# Patient Record
Sex: Female | Born: 1975
Health system: Southern US, Community
[De-identification: ages and names within clinical notes are randomized; demographics above are authoritative.]

## PROBLEM LIST (undated history)

## (undated) DIAGNOSIS — Z789 Other specified health status: Secondary | ICD-10-CM

## (undated) DIAGNOSIS — G479 Sleep disorder, unspecified: Secondary | ICD-10-CM

## (undated) DIAGNOSIS — F419 Anxiety disorder, unspecified: Secondary | ICD-10-CM

## (undated) DIAGNOSIS — N189 Chronic kidney disease, unspecified: Secondary | ICD-10-CM

## (undated) DIAGNOSIS — M1990 Unspecified osteoarthritis, unspecified site: Secondary | ICD-10-CM

## (undated) DIAGNOSIS — R0600 Dyspnea, unspecified: Secondary | ICD-10-CM

## (undated) DIAGNOSIS — G43709 Chronic migraine without aura, not intractable, without status migrainosus: Secondary | ICD-10-CM

## (undated) DIAGNOSIS — F429 Obsessive-compulsive disorder, unspecified: Secondary | ICD-10-CM

## (undated) DIAGNOSIS — R0602 Shortness of breath: Secondary | ICD-10-CM

## (undated) DIAGNOSIS — B009 Herpesviral infection, unspecified: Secondary | ICD-10-CM

## (undated) DIAGNOSIS — G4730 Sleep apnea, unspecified: Secondary | ICD-10-CM

## (undated) DIAGNOSIS — N289 Disorder of kidney and ureter, unspecified: Secondary | ICD-10-CM

## (undated) DIAGNOSIS — F988 Other specified behavioral and emotional disorders with onset usually occurring in childhood and adolescence: Secondary | ICD-10-CM

## (undated) DIAGNOSIS — F32A Depression, unspecified: Secondary | ICD-10-CM

## (undated) DIAGNOSIS — G4761 Periodic limb movement disorder: Secondary | ICD-10-CM

## (undated) DIAGNOSIS — E739 Lactose intolerance, unspecified: Secondary | ICD-10-CM

## (undated) DIAGNOSIS — N3010 Interstitial cystitis (chronic) without hematuria: Secondary | ICD-10-CM

## (undated) DIAGNOSIS — T7840XA Allergy, unspecified, initial encounter: Secondary | ICD-10-CM

## (undated) DIAGNOSIS — G5600 Carpal tunnel syndrome, unspecified upper limb: Secondary | ICD-10-CM

## (undated) DIAGNOSIS — F458 Other somatoform disorders: Secondary | ICD-10-CM

## (undated) DIAGNOSIS — K9041 Non-celiac gluten sensitivity: Secondary | ICD-10-CM

## (undated) DIAGNOSIS — Z87442 Personal history of urinary calculi: Secondary | ICD-10-CM

## (undated) DIAGNOSIS — R51 Headache: Secondary | ICD-10-CM

## (undated) DIAGNOSIS — K219 Gastro-esophageal reflux disease without esophagitis: Secondary | ICD-10-CM

## (undated) DIAGNOSIS — R197 Diarrhea, unspecified: Secondary | ICD-10-CM

## (undated) DIAGNOSIS — F329 Major depressive disorder, single episode, unspecified: Secondary | ICD-10-CM

## (undated) DIAGNOSIS — R609 Edema, unspecified: Secondary | ICD-10-CM

## (undated) DIAGNOSIS — IMO0002 Reserved for concepts with insufficient information to code with codable children: Secondary | ICD-10-CM

## (undated) HISTORY — DX: Diarrhea, unspecified: R19.7

## (undated) HISTORY — DX: Shortness of breath: R06.02

## (undated) HISTORY — DX: Disorder of kidney and ureter, unspecified: N28.9

## (undated) HISTORY — DX: Lactose intolerance, unspecified: E73.9

## (undated) HISTORY — DX: Carpal tunnel syndrome, unspecified upper limb: G56.00

## (undated) HISTORY — PX: OTHER SURGICAL HISTORY: SHX169

## (undated) HISTORY — DX: Reserved for concepts with insufficient information to code with codable children: IMO0002

## (undated) HISTORY — PX: WISDOM TOOTH EXTRACTION: SHX21

## (undated) HISTORY — DX: Periodic limb movement disorder: G47.61

## (undated) HISTORY — DX: Chronic migraine without aura, not intractable, without status migrainosus: G43.709

## (undated) HISTORY — DX: Other specified behavioral and emotional disorders with onset usually occurring in childhood and adolescence: F98.8

## (undated) HISTORY — DX: Allergy, unspecified, initial encounter: T78.40XA

## (undated) HISTORY — PX: COLONOSCOPY: SHX174

## (undated) HISTORY — DX: Interstitial cystitis (chronic) without hematuria: N30.10

## (undated) HISTORY — DX: Non-celiac gluten sensitivity: K90.41

## (undated) HISTORY — DX: Sleep disorder, unspecified: G47.9

## (undated) HISTORY — DX: Unspecified osteoarthritis, unspecified site: M19.90

## (undated) HISTORY — DX: Other somatoform disorders: F45.8

## (undated) HISTORY — DX: Edema, unspecified: R60.9

## (undated) HISTORY — DX: Obsessive-compulsive disorder, unspecified: F42.9

## (undated) HISTORY — PX: LASIK: SHX215

---

## 1999-03-22 ENCOUNTER — Emergency Department (HOSPITAL_COMMUNITY): Admission: EM | Admit: 1999-03-22 | Discharge: 1999-03-22 | Payer: Self-pay | Admitting: Emergency Medicine

## 1999-03-22 ENCOUNTER — Encounter: Payer: Self-pay | Admitting: Emergency Medicine

## 2001-01-09 ENCOUNTER — Other Ambulatory Visit: Admission: RE | Admit: 2001-01-09 | Discharge: 2001-01-09 | Payer: Self-pay | Admitting: Obstetrics and Gynecology

## 2001-05-24 ENCOUNTER — Inpatient Hospital Stay (HOSPITAL_COMMUNITY): Admission: AD | Admit: 2001-05-24 | Discharge: 2001-05-24 | Payer: Self-pay | Admitting: Obstetrics and Gynecology

## 2001-08-05 ENCOUNTER — Inpatient Hospital Stay (HOSPITAL_COMMUNITY): Admission: AD | Admit: 2001-08-05 | Discharge: 2001-08-08 | Payer: Self-pay | Admitting: Obstetrics and Gynecology

## 2001-08-09 ENCOUNTER — Inpatient Hospital Stay (HOSPITAL_COMMUNITY): Admission: AD | Admit: 2001-08-09 | Discharge: 2001-08-09 | Payer: Self-pay | Admitting: Obstetrics and Gynecology

## 2002-03-18 ENCOUNTER — Encounter: Payer: Self-pay | Admitting: Emergency Medicine

## 2002-03-18 ENCOUNTER — Emergency Department (HOSPITAL_COMMUNITY): Admission: EM | Admit: 2002-03-18 | Discharge: 2002-03-18 | Payer: Self-pay | Admitting: Emergency Medicine

## 2003-03-12 ENCOUNTER — Ambulatory Visit (HOSPITAL_COMMUNITY): Admission: RE | Admit: 2003-03-12 | Discharge: 2003-03-12 | Payer: Self-pay | Admitting: Internal Medicine

## 2003-03-12 ENCOUNTER — Encounter: Payer: Self-pay | Admitting: Internal Medicine

## 2003-07-10 ENCOUNTER — Emergency Department (HOSPITAL_COMMUNITY): Admission: EM | Admit: 2003-07-10 | Discharge: 2003-07-10 | Payer: Self-pay | Admitting: Emergency Medicine

## 2009-02-05 ENCOUNTER — Inpatient Hospital Stay (HOSPITAL_COMMUNITY): Admission: AD | Admit: 2009-02-05 | Discharge: 2009-02-05 | Payer: Self-pay | Admitting: Obstetrics & Gynecology

## 2009-05-05 ENCOUNTER — Encounter: Admission: RE | Admit: 2009-05-05 | Discharge: 2009-05-05 | Payer: Self-pay | Admitting: Family Medicine

## 2009-05-11 ENCOUNTER — Other Ambulatory Visit: Admission: RE | Admit: 2009-05-11 | Discharge: 2009-05-11 | Payer: Self-pay | Admitting: Family Medicine

## 2010-03-18 ENCOUNTER — Ambulatory Visit (HOSPITAL_COMMUNITY): Admission: RE | Admit: 2010-03-18 | Discharge: 2010-03-18 | Payer: Self-pay | Admitting: Psychiatry

## 2011-03-28 LAB — CBC
HCT: 38.1 % (ref 36.0–46.0)
Hemoglobin: 13 g/dL (ref 12.0–15.0)
MCHC: 34.2 g/dL (ref 30.0–36.0)
MCV: 89.2 fL (ref 78.0–100.0)
RBC: 4.27 MIL/uL (ref 3.87–5.11)
WBC: 8.1 10*3/uL (ref 4.0–10.5)

## 2011-03-28 LAB — URINALYSIS, ROUTINE W REFLEX MICROSCOPIC
Bilirubin Urine: NEGATIVE
Glucose, UA: NEGATIVE mg/dL
Ketones, ur: NEGATIVE mg/dL
Leukocytes, UA: NEGATIVE
Nitrite: NEGATIVE
Protein, ur: NEGATIVE mg/dL
Specific Gravity, Urine: 1.01 (ref 1.005–1.030)
Urobilinogen, UA: 0.2 mg/dL (ref 0.0–1.0)
pH: 7.5 (ref 5.0–8.0)

## 2011-03-28 LAB — WET PREP, GENITAL
Clue Cells Wet Prep HPF POC: NONE SEEN
Trich, Wet Prep: NONE SEEN
Yeast Wet Prep HPF POC: NONE SEEN

## 2011-03-28 LAB — URINE MICROSCOPIC-ADD ON

## 2011-03-28 LAB — POCT PREGNANCY, URINE: Preg Test, Ur: NEGATIVE

## 2011-04-28 NOTE — Op Note (Signed)
[name]  Patient:    Gloria Rodriguez, Gloria Rodriguez Visit Number: [id number] MRN: [id number]          Service Type: OBS Location: 910A 9101 01 Attending Physician:  Leonard Schwartz Dictated by:   Janine Limbo, M.D. Proc. Date: 08/05/01 Adm. Date:  08/05/2001                             Operative Report  PREOPERATIVE DIAGNOSES:       1. Term intrauterine pregnancy.                               2. Active labor.                               3. Questionable herpes simplex virus outbreak.  POSTOPERATIVE DIAGNOSES:      1. Term intrauterine pregnancy.                               2. Active labor.                               3. Questionable herpes simplex virus outbreak.                               4. Meconium-stained amniotic fluid.                               5. Fibroid uterus.  PROCEDURE:                    Primary low transverse cesarean section.  SURGEON:                      Janine Limbo, M.D.  ASSISTANT:                    Wynelle Bourgeois, CNM  ANESTHESIA:                   Spinal.  DISPOSITION:                  Ms. Army Melia is a 35 year old female, gravida 1, para 0, who presents at term. The patient has a history of herpes simplex virus and she reports having symptoms suggestive of a herpes simplex virus outbreak. The patient understands the indications for her procedure and she accepts the risk of, but not limited to, anesthetic complications, bleeding, infections, and possible damage to the surrounding organs.  FINDINGS:                     A 7-pound 13-ounce female infant Kara Mead) was delivered. The Apgars were 8 at one minute and 9 at five minutes. There was meconium-stained amniotic fluid but no meconium was noted beneath the vocal cords. The placenta appeared normal. There was a 1 cm fibroid on the fundus of the uterus. The uterus was otherwise normal. The fallopian tubes and the ovaries were normal.  ESTIMATED  BLOOD LOSS:         700 cc.  DESCRIPTION OF PROCEDURE:  The patient was taken to the operating room where a spinal anesthetic was given. The patients abdomen, perineum, and vagina were prepped with multiple layers of Betadine. A Foley catheter was placed in the bladder. The patient was sterilely draped. A low transverse incision was made in the abdomen and carried sharply through the subcutaneous tissue, the fascia, and the anterior peritoneum. An incision was made in the lower uterine segment and extended transversely. Meconium-stained amniotic fluid was noted. The fetal head was delivered. The mouth and nose were suctioned using the DeLee trap. The remainder of the infant was delivered. The cord was clamped and cut and the infant was handed to the awaiting pediatric team. Routine cord blood studies were obtained. The placenta was manually removed. The uterine cavity was cleaned of amniotic fluid, clotted blood, and membranes. The uterine incision was closed using a running suture of 2-0 Vicryl followed by figure-of-eight sutures of 2-0 Vicryl and 0 Vicryl for hemostasis. Hemostasis was noted to be adequate. The pericolonic gutters were cleaned of amniotic fluid and clotted blood. The anterior peritoneum and the abdominal musculature were reapproximated in the midline using 2-0 Vicryl. The fascia was irrigated as was the subcutaneous tissue. Hemostasis was adequate. The fascia was closed using a running suture of 0 Vicryl followed by three interrupted sutures of 0 Vicryl. The subcutaneous layer was closed using a running suture of 3-0 Vicryl. The skin was reapproximated using skin staples. Sponge, needle, and instrument counts were correct on two occasions. The estimated blood loss was 700 cc. The patient tolerated her procedure well. The patient was taken to the recovery room in stable condition. The infant was taken to the full-term nursery in stable condition. Dictated by:   Janine Limbo, M.D. Attending Physician:  Leonard Schwartz DD:  08/05/01 TD:  08/06/01 Job: [phone] [id number]
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
## 2011-04-28 NOTE — Discharge Summary (Signed)
[facility]  Patient:    Gloria Rodriguez, Gloria Rodriguez Visit Number: [id number] MRN: [id number]          Service Type: OBS Location: 910A 9101 01 Attending Physician:  Leonard Schwartz Dictated by:   Nigel Bridgeman, C.N.M. Adm. Date:  08/05/2001 Disc. Date: 08/08/01                             Discharge Summary  DATE OF BIRTH:                04/27/1976.  ADMITTING DIAGNOSES:          1. Term pregnancy.                               2. Active labor.                               3. Herpes simplex virus outbreak.  DISCHARGE DIAGNOSES:          1. Term pregnancy.                               2. Active labor.                               3. Herpes simplex virus outbreak.                               4. A 1-cm fibroid.                               5. Meconium fluid.  PROCEDURES:                   1. Primary low transverse cesarean section.                               2. Spinal anesthesia.  HOSPITAL COURSE:              Ms. Gloria Rodriguez is a 35 year old, gravida 1, para 0, at 39-4/7 weeks who presented in active labor on August 05, 2001. Her cervix was 5 cm and 80%. There was an area of friability of tissue in the left labia consistent with a HSV lesion. Therefore, the decision was made to proceed with primary elective low transverse cesarean section. The patient was taken to the operating room where this procedure was performed by Dr. Kirkland Hun. Findings were a viable female by the name of Kara Mead who weighed 7 pounds 13 ounces with Apgars of 8 and 9. Normal placenta was noted. There was a 1-cm fibroid. There was meconium fluid noted. Normal tubes and ovaries were identified. Estimated blood loss was 700 cc. The patient tolerated the procedure well and was taken to the recovery room in good condition. The infant was taken to the full-term nursery in good condition.  On postoperative day #1 the patient was doing well. She was up ad lib. She  was breast-feeding. She had not decided her method of contraception. Her hemoglobin was 11.9. Her physical exam was within normal limits.  Her incision was clean, dry, and intact.  The rest of her hospital course was unremarkable. By postoperative day #3 she was doing well. Her incision was clean, dry, and intact. Breast-feeding was going well and the patient was still undecided regarding contraception. She was deemed to have received the full benefit of her hospital stay and was discharged home.  DISCHARGE INSTRUCTIONS:       Instructions are [employer].  DISCHARGE MEDICATIONS:        1. Motrin 600 mg p.o. q.6h. p.r.n. pain.                               2. Tylox one to two p.o. q.3-4h. p.r.n. pain.  DISCHARGE FOLLOWUP:           Followup will occur in six weeks at [facility]. Dictated by:   Nigel Bridgeman, C.N.M. Attending Physician:  Leonard Schwartz DD:  08/08/01 TD:  08/08/01 Job: [phone] [id number]

## 2011-07-11 ENCOUNTER — Encounter (HOSPITAL_COMMUNITY): Payer: Self-pay | Admitting: Anesthesiology

## 2011-07-11 ENCOUNTER — Inpatient Hospital Stay (HOSPITAL_COMMUNITY): Payer: Managed Care, Other (non HMO) | Admitting: Anesthesiology

## 2011-07-11 ENCOUNTER — Other Ambulatory Visit: Payer: Self-pay | Admitting: Obstetrics & Gynecology

## 2011-07-11 ENCOUNTER — Encounter (HOSPITAL_COMMUNITY)
Admission: AD | Disposition: A | Discharge status: Home / Self Care | Payer: Self-pay | Source: Ambulatory Visit | Attending: Obstetrics & Gynecology

## 2011-07-11 ENCOUNTER — Encounter (HOSPITAL_COMMUNITY): Payer: Self-pay | Admitting: *Deleted

## 2011-07-11 ENCOUNTER — Inpatient Hospital Stay (HOSPITAL_COMMUNITY)
Admission: AD | Admit: 2011-07-11 | Discharge: 2011-07-14 | DRG: 766 | Disposition: A | Discharge status: Home / Self Care | Payer: Managed Care, Other (non HMO) | Source: Ambulatory Visit | Attending: Obstetrics & Gynecology | Admitting: Obstetrics & Gynecology

## 2011-07-11 DIAGNOSIS — O34219 Maternal care for unspecified type scar from previous cesarean delivery: Principal | ICD-10-CM | POA: Diagnosis present

## 2011-07-11 DIAGNOSIS — O09529 Supervision of elderly multigravida, unspecified trimester: Secondary | ICD-10-CM | POA: Diagnosis present

## 2011-07-11 HISTORY — DX: Headache: R51

## 2011-07-11 HISTORY — DX: Depression, unspecified: F32.A

## 2011-07-11 HISTORY — DX: Herpesviral infection, unspecified: B00.9

## 2011-07-11 HISTORY — DX: Gastro-esophageal reflux disease without esophagitis: K21.9

## 2011-07-11 HISTORY — DX: Anxiety disorder, unspecified: F41.9

## 2011-07-11 HISTORY — DX: Major depressive disorder, single episode, unspecified: F32.9

## 2011-07-11 LAB — CBC
MCH: 29.6 pg (ref 26.0–34.0)
MCHC: 34 g/dL (ref 30.0–36.0)
MCV: 87 fL (ref 78.0–100.0)
Platelets: 193 10*3/uL (ref 150–400)
RDW: 16.4 % — ABNORMAL HIGH (ref 11.5–15.5)

## 2011-07-11 LAB — RPR: RPR: NONREACTIVE

## 2011-07-11 LAB — TYPE AND SCREEN: Antibody Screen: NEGATIVE

## 2011-07-11 LAB — STREP B DNA PROBE: GBS: NEGATIVE

## 2011-07-11 LAB — HIV ANTIBODY (ROUTINE TESTING W REFLEX): HIV: NONREACTIVE

## 2011-07-11 SURGERY — Surgical Case
Anesthesia: Epidural | Site: Abdomen | Wound class: Clean Contaminated

## 2011-07-11 MED ORDER — LACTATED RINGERS IV SOLN
INTRAVENOUS | Status: DC
  Administered 2011-07-11: 07:00:00 via INTRAVENOUS

## 2011-07-11 MED ORDER — MAGNESIUM HYDROXIDE 400 MG/5ML PO SUSP: 30.0000 mL | ORAL | Status: DC | PRN

## 2011-07-11 MED ORDER — OXYTOCIN 20 UNITS IN LACTATED RINGERS INFUSION - SIMPLE: 1.0000 m[IU]/min | INTRAVENOUS | Status: DC

## 2011-07-11 MED ORDER — OXYTOCIN 10 UNIT/ML IJ SOLN
INTRAMUSCULAR | Status: AC
  Filled 2011-07-11: qty 2

## 2011-07-11 MED ORDER — DIPHENHYDRAMINE HCL 25 MG PO CAPS
25.0000 mg | ORAL_CAPSULE | Freq: Four times a day (QID) | ORAL | Status: DC | PRN
  Administered 2011-07-11 – 2011-07-12 (×2): 25 mg via ORAL
  Filled 2011-07-11 (×2): qty 1

## 2011-07-11 MED ORDER — ACETAMINOPHEN 10 MG/ML IV SOLN: 1000.0000 mg | Freq: Four times a day (QID) | INTRAVENOUS | Status: AC | PRN

## 2011-07-11 MED ORDER — SERTRALINE HCL 100 MG PO TABS
100.0000 mg | ORAL_TABLET | Freq: Every day | ORAL | Status: DC
  Administered 2011-07-11 – 2011-07-14 (×4): 100 mg via ORAL
  Filled 2011-07-11 (×4): qty 1

## 2011-07-11 MED ORDER — LACTATED RINGERS IV SOLN
500.0000 mL | Freq: Once | INTRAVENOUS | Status: AC
  Administered 2011-07-11: 300 mL via INTRAVENOUS

## 2011-07-11 MED ORDER — HYDROMORPHONE HCL 1 MG/ML IJ SOLN: 0.2500 mg | INTRAMUSCULAR | Status: DC | PRN

## 2011-07-11 MED ORDER — LACTATED RINGERS IV SOLN
INTRAVENOUS | Status: DC | PRN
  Administered 2011-07-11 (×4): via INTRAVENOUS

## 2011-07-11 MED ORDER — BUPIVACAINE HCL (PF) 0.25 % IJ SOLN
INTRAMUSCULAR | Status: DC | PRN
  Administered 2011-07-11: 10 mL
  Administered 2011-07-11: 20 mL

## 2011-07-11 MED ORDER — ZOLPIDEM TARTRATE 5 MG PO TABS: 5.0000 mg | ORAL_TABLET | Freq: Every evening | ORAL | Status: DC | PRN

## 2011-07-11 MED ORDER — EPHEDRINE 5 MG/ML INJ
INTRAVENOUS | Status: AC
  Filled 2011-07-11: qty 10

## 2011-07-11 MED ORDER — MEPERIDINE HCL 25 MG/ML IJ SOLN
INTRAMUSCULAR | Status: DC | PRN
  Administered 2011-07-11: 25 mg via INTRAVENOUS

## 2011-07-11 MED ORDER — IBUPROFEN 600 MG PO TABS
600.0000 mg | ORAL_TABLET | Freq: Four times a day (QID) | ORAL | Status: DC
  Administered 2011-07-12 – 2011-07-14 (×11): 600 mg via ORAL
  Filled 2011-07-11 (×4): qty 1

## 2011-07-11 MED ORDER — ACETAMINOPHEN 10 MG/ML IV SOLN: 1000.0000 mg | Freq: Once | INTRAVENOUS | Status: DC | PRN

## 2011-07-11 MED ORDER — LACTATED RINGERS IV SOLN
500.0000 mL | INTRAVENOUS | Status: DC | PRN
  Administered 2011-07-11: 500 mL via INTRAVENOUS

## 2011-07-11 MED ORDER — LIDOCAINE-EPINEPHRINE (PF) 2 %-1:200000 IJ SOLN
INTRAMUSCULAR | Status: AC
  Filled 2011-07-11: qty 20

## 2011-07-11 MED ORDER — MEPERIDINE HCL 25 MG/ML IJ SOLN
INTRAMUSCULAR | Status: AC
  Filled 2011-07-11: qty 1

## 2011-07-11 MED ORDER — EPHEDRINE 5 MG/ML INJ
10.0000 mg | INTRAVENOUS | Status: DC | PRN
  Filled 2011-07-11: qty 4

## 2011-07-11 MED ORDER — DIPHENHYDRAMINE HCL 50 MG/ML IJ SOLN
12.5000 mg | INTRAMUSCULAR | Status: DC | PRN
  Filled 2011-07-11: qty 1

## 2011-07-11 MED ORDER — FENTANYL CITRATE 0.05 MG/ML IJ SOLN
INTRAMUSCULAR | Status: AC
  Filled 2011-07-11: qty 2

## 2011-07-11 MED ORDER — NALOXONE HCL 0.4 MG/ML IJ SOLN: 0.4000 mg | INTRAMUSCULAR | Status: DC | PRN

## 2011-07-11 MED ORDER — LIDOCAINE HCL (PF) 1 % IJ SOLN
30.0000 mL | INTRAMUSCULAR | Status: DC | PRN
  Filled 2011-07-11: qty 30

## 2011-07-11 MED ORDER — KETOROLAC TROMETHAMINE 30 MG/ML IJ SOLN
INTRAMUSCULAR | Status: AC
  Administered 2011-07-11: 30 mg via INTRAVENOUS
  Filled 2011-07-11: qty 1

## 2011-07-11 MED ORDER — PHENYLEPHRINE HCL 10 MG/ML IJ SOLN
INTRAMUSCULAR | Status: DC | PRN
  Administered 2011-07-11: 80 ug via INTRAVENOUS

## 2011-07-11 MED ORDER — ONDANSETRON HCL 4 MG/2ML IJ SOLN
4.0000 mg | Freq: Four times a day (QID) | INTRAMUSCULAR | Status: DC | PRN
  Administered 2011-07-11: 4 mg via INTRAVENOUS
  Filled 2011-07-11: qty 2

## 2011-07-11 MED ORDER — TERBUTALINE SULFATE 1 MG/ML IJ SOLN: 0.2500 mg | Freq: Once | INTRAMUSCULAR | Status: DC | PRN

## 2011-07-11 MED ORDER — EPHEDRINE 5 MG/ML INJ
10.0000 mg | INTRAVENOUS | Status: DC | PRN
  Filled 2011-07-11 (×2): qty 4

## 2011-07-11 MED ORDER — MENTHOL 3 MG MT LOZG: 1.0000 | LOZENGE | OROMUCOSAL | Status: DC | PRN

## 2011-07-11 MED ORDER — DIPHENHYDRAMINE HCL 25 MG PO CAPS: 25.0000 mg | ORAL_CAPSULE | ORAL | Status: DC | PRN

## 2011-07-11 MED ORDER — HYDROMORPHONE HCL 1 MG/ML IJ SOLN: 2.0000 mg | INTRAMUSCULAR | Status: DC | PRN

## 2011-07-11 MED ORDER — CEFAZOLIN SODIUM 1-5 GM-% IV SOLN
INTRAVENOUS | Status: AC
  Filled 2011-07-11: qty 50

## 2011-07-11 MED ORDER — ONDANSETRON HCL 4 MG/2ML IJ SOLN: 4.0000 mg | Freq: Three times a day (TID) | INTRAMUSCULAR | Status: DC | PRN

## 2011-07-11 MED ORDER — FLEET ENEMA 7-19 GM/118ML RE ENEM: 1.0000 | ENEMA | RECTAL | Status: DC | PRN

## 2011-07-11 MED ORDER — IBUPROFEN 600 MG PO TABS: 600.0000 mg | ORAL_TABLET | Freq: Four times a day (QID) | ORAL | Status: DC | PRN

## 2011-07-11 MED ORDER — SODIUM CHLORIDE 0.9 % IR SOLN
Status: DC | PRN
  Administered 2011-07-11: 1000 mL

## 2011-07-11 MED ORDER — IBUPROFEN 600 MG PO TABS
600.0000 mg | ORAL_TABLET | Freq: Four times a day (QID) | ORAL | Status: DC | PRN
  Filled 2011-07-11 (×7): qty 1

## 2011-07-11 MED ORDER — OXYTOCIN 20 UNITS IN LACTATED RINGERS INFUSION - SIMPLE
INTRAVENOUS | Status: DC | PRN
  Administered 2011-07-11 (×2): 20 [IU] via INTRAVENOUS

## 2011-07-11 MED ORDER — FENTANYL CITRATE 0.05 MG/ML IJ SOLN
INTRAMUSCULAR | Status: DC | PRN
  Administered 2011-07-11 (×4): 25 ug via INTRAVENOUS

## 2011-07-11 MED ORDER — MEPERIDINE HCL 25 MG/ML IJ SOLN: 6.2500 mg | INTRAMUSCULAR | Status: DC | PRN

## 2011-07-11 MED ORDER — CEFAZOLIN SODIUM 1-5 GM-% IV SOLN
INTRAVENOUS | Status: DC | PRN
  Administered 2011-07-11: 1 g via INTRAVENOUS

## 2011-07-11 MED ORDER — MORPHINE SULFATE (PF) 0.5 MG/ML IJ SOLN
INTRAMUSCULAR | Status: DC | PRN
  Administered 2011-07-11: 1 mg via INTRAVENOUS

## 2011-07-11 MED ORDER — FENTANYL 2.5 MCG/ML BUPIVACAINE 1/10 % EPIDURAL INFUSION (WH - ANES)
14.0000 mL/h | INTRAMUSCULAR | Status: DC
  Administered 2011-07-11: 14 mL/h via EPIDURAL
  Filled 2011-07-11: qty 60

## 2011-07-11 MED ORDER — PHENYLEPHRINE 40 MCG/ML (10ML) SYRINGE FOR IV PUSH (FOR BLOOD PRESSURE SUPPORT)
80.0000 ug | PREFILLED_SYRINGE | INTRAVENOUS | Status: DC | PRN
  Administered 2011-07-11 (×2): 80 ug via INTRAVENOUS
  Filled 2011-07-11 (×2): qty 5

## 2011-07-11 MED ORDER — SODIUM CHLORIDE 0.9 % IJ SOLN: 3.0000 mL | INTRAMUSCULAR | Status: DC | PRN

## 2011-07-11 MED ORDER — DIPHENHYDRAMINE HCL 50 MG/ML IJ SOLN
INTRAMUSCULAR | Status: AC
  Filled 2011-07-11: qty 1

## 2011-07-11 MED ORDER — ACETAMINOPHEN 325 MG PO TABS: 325.0000 mg | ORAL_TABLET | ORAL | Status: DC | PRN

## 2011-07-11 MED ORDER — SENNOSIDES-DOCUSATE SODIUM 8.6-50 MG PO TABS
1.0000 | ORAL_TABLET | Freq: Every day | ORAL | Status: DC
  Administered 2011-07-11 (×2): 1 via ORAL
  Administered 2011-07-12 – 2011-07-13 (×2): 2 via ORAL

## 2011-07-11 MED ORDER — SIMETHICONE 80 MG PO CHEW
80.0000 mg | CHEWABLE_TABLET | ORAL | Status: DC | PRN
  Administered 2011-07-11: 80 mg via ORAL

## 2011-07-11 MED ORDER — DIPHENHYDRAMINE HCL 50 MG/ML IJ SOLN
12.5000 mg | INTRAMUSCULAR | Status: DC | PRN
  Administered 2011-07-11: 50 mg via INTRAVENOUS

## 2011-07-11 MED ORDER — METOCLOPRAMIDE HCL 5 MG/ML IJ SOLN: 10.0000 mg | Freq: Three times a day (TID) | INTRAMUSCULAR | Status: DC | PRN

## 2011-07-11 MED ORDER — NALBUPHINE SYRINGE 5 MG/0.5 ML
5.0000 mg | INJECTION | INTRAMUSCULAR | Status: DC | PRN
  Filled 2011-07-11: qty 1

## 2011-07-11 MED ORDER — SODIUM CHLORIDE 0.9 % IV SOLN
1.0000 ug/kg/h | INTRAVENOUS | Status: DC | PRN
  Filled 2011-07-11: qty 2.5

## 2011-07-11 MED ORDER — PROMETHAZINE HCL 25 MG/ML IJ SOLN: 6.2500 mg | INTRAMUSCULAR | Status: DC | PRN

## 2011-07-11 MED ORDER — FERROUS SULFATE 325 (65 FE) MG PO TABS
325.0000 mg | ORAL_TABLET | Freq: Two times a day (BID) | ORAL | Status: DC
  Administered 2011-07-12 – 2011-07-14 (×4): 325 mg via ORAL
  Filled 2011-07-11 (×4): qty 1

## 2011-07-11 MED ORDER — TETANUS-DIPHTH-ACELL PERTUSSIS 5-2.5-18.5 LF-MCG/0.5 IM SUSP: 0.5000 mL | Freq: Once | INTRAMUSCULAR | Status: DC

## 2011-07-11 MED ORDER — SIMETHICONE 80 MG PO CHEW
80.0000 mg | CHEWABLE_TABLET | Freq: Three times a day (TID) | ORAL | Status: DC
  Administered 2011-07-12 – 2011-07-14 (×9): 80 mg via ORAL

## 2011-07-11 MED ORDER — PRENATAL PLUS 27-1 MG PO TABS
1.0000 | ORAL_TABLET | Freq: Every day | ORAL | Status: DC
  Administered 2011-07-12 – 2011-07-14 (×3): 1 via ORAL
  Filled 2011-07-11 (×3): qty 1

## 2011-07-11 MED ORDER — MORPHINE SULFATE (PF) 0.5 MG/ML IJ SOLN
INTRAMUSCULAR | Status: DC | PRN
  Administered 2011-07-11: 4 mg via EPIDURAL

## 2011-07-11 MED ORDER — ONDANSETRON HCL 4 MG PO TABS: 4.0000 mg | ORAL_TABLET | ORAL | Status: DC | PRN

## 2011-07-11 MED ORDER — WITCH HAZEL-GLYCERIN EX PADS: MEDICATED_PAD | CUTANEOUS | Status: DC | PRN

## 2011-07-11 MED ORDER — LANOLIN HYDROUS EX OINT: 1.0000 "application " | TOPICAL_OINTMENT | CUTANEOUS | Status: DC | PRN

## 2011-07-11 MED ORDER — PHENYLEPHRINE 40 MCG/ML (10ML) SYRINGE FOR IV PUSH (FOR BLOOD PRESSURE SUPPORT)
80.0000 ug | PREFILLED_SYRINGE | INTRAVENOUS | Status: DC | PRN
  Filled 2011-07-11: qty 5

## 2011-07-11 MED ORDER — DIPHENHYDRAMINE HCL 50 MG/ML IJ SOLN: 25.0000 mg | INTRAMUSCULAR | Status: DC | PRN

## 2011-07-11 MED ORDER — OXYTOCIN 20 UNITS IN LACTATED RINGERS INFUSION - SIMPLE: 125.0000 mL/h | INTRAVENOUS | Status: AC

## 2011-07-11 MED ORDER — ONDANSETRON HCL 4 MG/2ML IJ SOLN
INTRAMUSCULAR | Status: DC | PRN
  Administered 2011-07-11: 4 mg via INTRAVENOUS

## 2011-07-11 MED ORDER — SODIUM BICARBONATE 8.4 % IV SOLN
INTRAVENOUS | Status: AC
  Filled 2011-07-11: qty 50

## 2011-07-11 MED ORDER — SODIUM BICARBONATE 8.4 % IV SOLN
INTRAVENOUS | Status: DC | PRN
  Administered 2011-07-11: 5 mL via EPIDURAL

## 2011-07-11 MED ORDER — SCOPOLAMINE 1 MG/3DAYS TD PT72: 1.0000 | MEDICATED_PATCH | Freq: Once | TRANSDERMAL | Status: DC

## 2011-07-11 MED ORDER — LACTATED RINGERS IV SOLN: INTRAVENOUS | Status: DC

## 2011-07-11 MED ORDER — PHENYLEPHRINE 40 MCG/ML (10ML) SYRINGE FOR IV PUSH (FOR BLOOD PRESSURE SUPPORT)
PREFILLED_SYRINGE | INTRAVENOUS | Status: AC
  Filled 2011-07-11: qty 5

## 2011-07-11 MED ORDER — ONDANSETRON HCL 4 MG/2ML IJ SOLN
INTRAMUSCULAR | Status: AC
  Filled 2011-07-11: qty 2

## 2011-07-11 MED ORDER — EPHEDRINE SULFATE 50 MG/ML IJ SOLN
INTRAMUSCULAR | Status: DC | PRN
  Administered 2011-07-11: 15 mg via INTRAVENOUS

## 2011-07-11 MED ORDER — CITRIC ACID-SODIUM CITRATE 334-500 MG/5ML PO SOLN
30.0000 mL | ORAL | Status: DC | PRN
  Administered 2011-07-11 (×2): 30 mL via ORAL
  Filled 2011-07-11 (×2): qty 30

## 2011-07-11 MED ORDER — ONDANSETRON HCL 4 MG/2ML IJ SOLN: 4.0000 mg | INTRAMUSCULAR | Status: DC | PRN

## 2011-07-11 MED ORDER — OXYTOCIN 20 UNITS IN LACTATED RINGERS INFUSION - SIMPLE: 125.0000 mL/h | Freq: Once | INTRAVENOUS | Status: DC

## 2011-07-11 MED ORDER — KETOROLAC TROMETHAMINE 30 MG/ML IJ SOLN: 30.0000 mg | Freq: Four times a day (QID) | INTRAMUSCULAR | Status: AC | PRN

## 2011-07-11 MED ORDER — MORPHINE SULFATE 0.5 MG/ML IJ SOLN
INTRAMUSCULAR | Status: AC
  Filled 2011-07-11: qty 10

## 2011-07-11 MED ORDER — KETOROLAC TROMETHAMINE 30 MG/ML IJ SOLN
30.0000 mg | Freq: Four times a day (QID) | INTRAMUSCULAR | Status: AC | PRN
  Administered 2011-07-11 (×2): 30 mg via INTRAVENOUS
  Filled 2011-07-11: qty 1

## 2011-07-11 SURGICAL SUPPLY — 39 items
CLOTH BEACON ORANGE TIMEOUT ST (SAFETY) ×2 IMPLANT
CONTAINER PREFILL 10% NBF 15ML (MISCELLANEOUS) IMPLANT
DRAPE UTILITY XL STRL (DRAPES) ×2 IMPLANT
DRESSING TELFA 8X3 (GAUZE/BANDAGES/DRESSINGS) ×2 IMPLANT
ELECT REM PT RETURN 9FT ADLT (ELECTROSURGICAL) ×2
ELECTRODE REM PT RTRN 9FT ADLT (ELECTROSURGICAL) ×1 IMPLANT
EXTRACTOR VACUUM M CUP 4 TUBE (SUCTIONS) IMPLANT
GLOVE BIO SURGEON STRL SZ 6.5 (GLOVE) ×4 IMPLANT
GLOVE BIO SURGEON STRL SZ7 (GLOVE) ×2 IMPLANT
GLOVE ECLIPSE 6.0 STRL STRAW (GLOVE) ×2 IMPLANT
GLOVE ECLIPSE 8.0 STRL XLNG CF (GLOVE) ×2 IMPLANT
GLOVE INDICATOR 6.5 STRL GRN (GLOVE) ×2 IMPLANT
GLOVE INDICATOR 7.5 STRL GRN (GLOVE) ×2 IMPLANT
GOWN BRE IMP SLV AUR LG STRL (GOWN DISPOSABLE) ×4 IMPLANT
GOWN PREVENTION PLUS LG XLONG (DISPOSABLE) ×2 IMPLANT
KIT ABG SYR 3ML LUER SLIP (SYRINGE) IMPLANT
NEEDLE HYPO 22GX1.5 SAFETY (NEEDLE) ×2 IMPLANT
NEEDLE HYPO 25X5/8 SAFETYGLIDE (NEEDLE) ×2 IMPLANT
NS IRRIG 1000ML POUR BTL (IV SOLUTION) ×2 IMPLANT
PACK C SECTION WH (CUSTOM PROCEDURE TRAY) ×2 IMPLANT
PAD ABD 7.5X8 STRL (GAUZE/BANDAGES/DRESSINGS) ×2 IMPLANT
RTRCTR C-SECT PINK 25CM LRG (MISCELLANEOUS) IMPLANT
SLEEVE SCD COMPRESS KNEE MED (MISCELLANEOUS) IMPLANT
STAPLER VISISTAT 35W (STAPLE) ×2 IMPLANT
SUT PLAIN 0 NONE (SUTURE) IMPLANT
SUT PLAIN 2 0 (SUTURE) ×1
SUT PLAIN ABS 2-0 CT1 27XMFL (SUTURE) ×1 IMPLANT
SUT VIC AB 0 CT1 27 (SUTURE) ×2
SUT VIC AB 0 CT1 27XBRD ANBCTR (SUTURE) ×2 IMPLANT
SUT VIC AB 0 CTX 36 (SUTURE) ×2
SUT VIC AB 0 CTX36XBRD ANBCTRL (SUTURE) ×2 IMPLANT
SUT VIC AB 2-0 CT1 27 (SUTURE)
SUT VIC AB 2-0 CT1 TAPERPNT 27 (SUTURE) IMPLANT
SUT VIC AB 3-0 SH 27 (SUTURE)
SUT VIC AB 3-0 SH 27X BRD (SUTURE) IMPLANT
SYR CONTROL 10ML LL (SYRINGE) ×2 IMPLANT
TOWEL OR 17X24 6PK STRL BLUE (TOWEL DISPOSABLE) ×4 IMPLANT
TRAY FOLEY CATH 14FR (SET/KITS/TRAYS/PACK) IMPLANT
WATER STERILE IRR 1000ML POUR (IV SOLUTION) ×2 IMPLANT

## 2011-07-11 NOTE — Progress Notes (Signed)
[facility] INFORMATION GIVEN TO PATIENT.  PATIENT STATES BABY IS FEEDING WELL.  ENCOURAGED TO CALL WITH QUESTIONS/ASSIST.

## 2011-07-11 NOTE — Anesthesia Postprocedure Evaluation (Signed)
  Anesthesia Post-op Note  Patient: Gloria Rodriguez  Procedure(s) Performed:  CESAREAN SECTION - Repeat   Patient is awake, responsive, moving her legs, and has signs of resolution of her numbness. Pain and nausea are reasonably well controlled. Vital signs are stable and clinically acceptable. Oxygen saturation is clinically acceptable. There are no apparent anesthetic complications at this time. Patient is ready for discharge.

## 2011-07-11 NOTE — Op Note (Signed)
07/11/2011  10:04 AM  PATIENT:  Gloria Rodriguez  35 y.o. female  PRE-OPERATIVE DIAGNOSIS:  fetal intolerance of labor, previous cesarean section  POST-OPERATIVE DIAGNOSIS:  fetal intolerance of labor, previous cesarean section  PROCEDURE:  Procedure(s): Repeat urgent CESAREAN SECTION  SURGEON:  Surgeon(s): Marie-Lyne Nevaeh Casillas  ASSISTANTS:  Arlan Organ   ANESTHESIA:   epidural  Procedure: Under general anesthesia the patient is in 15 left decubitus position, she is prepped with Betadine on the abdominal and suprapubic areas. She is draped as usual. The subcutaneous tissue was infiltrated with Marcaine one quarter plain 10 cc. A Pfannenstiel incision is made with a scalpel at the site of the previous scar. The adipose tissue was opened with the scalpel. The aponeurosis is opened with a scalpel and then with Mayo scissors transversely. The recti muscles were separated from the aponeurosis of the midline superiorly and inferiorly. The parietal peritoneum is opened bluntly with the fingers. The visceral peritoneum is opened transversely with Metzenbaums scissors over the lower uterine segment. The bladder reclined downwards.  A transverse hysterotomy is done with the scalpel  Over the lower uterine segment and extended on each side with dressing scissors.  The fetus is in cephalic presentation. Birth of a baby girl at 9:37 am.  The cord was on clamped and cut. PH was done which came back at 7.29. The baby was suctioned and given to the neonatal team. Apgars were 8 and 9. The placenta was extracted manually.  Uterine revision done. Pitocin started in the IV fluid. The patient received a dose of Ancef 1 g IV before starting the surgery. The uterus was contracting well. Both ovaries and both tubes were normal to inspection. The hysterotomy was closed in a first full plane locked running suture of Vicryl 0. A mattress layer of Vicryl 0 was then done as the second layer. Hemostasis was completed on the lower  right aspect of the incision with 2 figure-of-eight with Vicryl 2-0. The abdomino-pelvic cavities were irrigated and suctioned.  The aponeurosis was closed into half running sutures of Vicryl 0. A running stitch of plain was done in the adipose tissue. And the skin was reapproximated with staples. A compressive dry dressing was applied. The patient was brought to recovery room in good and stable status.   ESTIMATED BLOOD LOSS: * No blood loss amount entered *   Intake/Output Summary (Last 24 hours) at 07/11/11 1004 Last data filed at 07/11/11 0945  Gross per 24 hour  Intake   2500 ml  Output    800 ml  Net   1700 ml     BLOOD ADMINISTERED:none   LOCAL MEDICATIONS USED:  MARCAINE 10 CC  SPECIMEN:  Source of Specimen:  Placenta  DISPOSITION OF SPECIMEN:  PATHOLOGY  COUNTS:  YES   PLAN OF CARE: Transfer to PACU    Genia Del MD

## 2011-07-11 NOTE — H&P (Signed)
Gloria Rodriguez is a 35 y.o. female G2P1001 [redacted]w[redacted]d presenting for SROM, clear fluid at 1 am.  HPI:  SROM clear fluid at 1 am.  Mild irregular UCs.  FMs pos.  No PIH Sx.  ROS neg.  OB History    Grav Para Term Preterm Abortions TAB SAB Ect Mult Living   2 1 1  0 0 0 0 0 0 1     Past Medical History  Diagnosis Date  . Anxiety   . Depression   . Headache   . GERD (gastroesophageal reflux disease)   . HSV infection     on Valtrex suppression   Past Surgical History  Procedure Date  . Cesarean section        For genital HSV  Family History: family history is not on file. Social History:  does not have a smoking history on file. She does not have any smokeless tobacco history on file. She reports that she does not drink alcohol or use illicit drugs. Current facility-administered medications:citric acid-sodium citrate (ORACIT) solution 30 mL, 30 mL, Oral, Q2H PRN, Marie-Lyne Khaila Velarde, 30 mL at 07/11/11 0401;  diphenhydrAMINE (BENADRYL) injection 12.5 mg, 12.5 mg, Intravenous, Q15 min PRN, Tyrone Apple. Foster;  ePHEDrine injection 10 mg, 10 mg, Intravenous, PRN, Tyrone Apple. Foster;  ePHEDrine injection 10 mg, 10 mg, Intravenous, PRN, Tyrone Apple. Foster fentaNYL 2.5 mcg/ml w/bupivacaine 0.1% epidural (60 ml syringe), 14 mL/hr, Epidural, Continuous, Michael A. Foster;  ibuprofen (ADVIL,MOTRIN) tablet 600 mg, 600 mg, Oral, Q6H PRN, Marie-Lyne Solangel Mcmanaway;  lactated ringers infusion 500 mL, 500 mL, Intravenous, Once, Michael A. Foster, Last Rate: 999 mL/hr at 07/11/11 0348, 300 mL at 07/11/11 0348;  lactated ringers infusion 500-1,000 mL, 500-1,000 mL, Intravenous, PRN, Marie-Lyne Andreu Drudge lactated ringers infusion, , Intravenous, Continuous, Marie-Lyne Tawnia Schirm;  lidocaine (XYLOCAINE) 1 % injection 30 mL, 30 mL, Subcutaneous, PRN, Marie-Lyne Parthenia Tellefsen;  ondansetron (ZOFRAN) injection 4 mg, 4 mg, Intravenous, Q6H PRN, Marie-Lyne Saabir Blyth;  oxytocin (PITOCIN) IV infusion 20 units in LR 1000 mL, 125 mL/hr, Intravenous, Once,  Marie-Lyne Meelah Tallo oxytocin (PITOCIN) IV infusion 20 units in LR 1000 mL, 1-40 milli-units/min, Intravenous, Continuous, Marie-Lyne Tammera Engert;  phenylephrine injection 80 mcg, 80 mcg, Intravenous, PRN, Tyrone Apple. Foster;  phenylephrine injection 80 mcg, 80 mcg, Intravenous, PRN, Tyrone Apple. Foster;  sodium phosphate (FLEET) 7-19 GM/118ML enema 1 enema, 1 enema, Rectal, PRN, Marie-Lyne Dayron Odland terbutaline (BRETHINE) injection 0.25 mg, 0.25 mg, Subcutaneous, Once PRN, Marie-Lyne Shakthi Scipio Allergies  Allergen Reactions  . Codeine Other (See Comments)    migraines  . Hydrocodone Other (See Comments)    migraines  . Oxycodone Other (See Comments)    migraines  . Ultram (Tramadol Hcl) Other (See Comments)    Migraines/nightmares  . Vicodin (Hydrocodone-Acetaminophen) Other (See Comments)    migraines    Dilation: 1 Effacement (%): 50 Station: -3 Exam by:: Morrie Sheldon, RN Blood pressure 116/62, pulse 70, temperature 98.6 F (37 C), temperature source Oral, resp. rate 20, height 5' 5.5" (1.664 m), weight 94.802 kg (209 lb).  VE:  Unchanged, except Vtx fixed at -2. Lower limbs wnl  HPP:   [place] review of anato wnl.  1 hr GTT wnl.  GBS neg.  [place] interval growth AGA, AFI 29 cm stable at 36 wks, cephalic.  Prenatal labs: ABO, Rh:  O pos Antibody: Negative (07/31 0223) Rubella:  Immune RPR: Nonreactive (07/31 0224)  HBsAg: Negative (07/31 0224)  HIV: Non-reactive (07/31 0224)  Genetic testing:  Ultrascreen neg, AFP neg [place] anato: wnl 1  hr GTT: wnl GBS: Negative (07/31 0224)   Assessment/Plan: G2P1 37+ wks with SROM not in active labor, but increasing UCs, cephalic.  H/O previous C/S for genital HSV now on Valtrex.  Desires TOLAC.  F-well-being reassuring, reactive with BLine in 140's, resolved variable decelerations in Rt decubitus with O2 mask. Pitocin if no active labor at 6 am.  Epidural PRN.   Daleon Willinger,MARIE-LYNE  MD 07/11/2011, 4:19 AM

## 2011-07-11 NOTE — Anesthesia Procedure Notes (Signed)
Epidural Patient location during procedure: OB Start time: 07/11/2011 7:55 AM  Staffing Anesthesiologist: Brayton Caves R Performed by: anesthesiologist   Preanesthetic Checklist Completed: patient identified, site marked, surgical consent, pre-op evaluation, timeout performed, IV checked, risks and benefits discussed and monitors and equipment checked  Epidural Patient position: sitting Prep: site prepped and draped and DuraPrep Patient monitoring: continuous pulse ox and blood pressure Approach: midline Injection technique: LOR saline  Needle:  Needle type: Tuohy  Needle gauge: 17 G Needle length: 9 cm Needle insertion depth: 7 cm Catheter type: closed end flexible Catheter size: 19 Gauge Catheter at skin depth: 12 cm Test dose: negative  Assessment Events: blood not aspirated, injection not painful, no injection resistance, negative IV test and no paresthesia  Additional Notes Patient identified.  Risk benefits discussed including failed block, incomplete pain control, headache, nerve damage, paralysis, blood pressure changes, nausea, vomiting, reactions to medication both toxic or allergic, and postpartum back pain.  Patient expressed understanding and wished to proceed.  All questions were answered.  Sterile technique used throughout procedure and epidural site dressed with sterile barrier dressing. No paresthesia or other complications noted. 5 minutes prior to starting epidural infusion of fentanyl and bupivicaine, the epidural was loaded with 9 ml of 1.5% lidocaine in three separate doses.  The patient did not experience any signs of intravascular injection such as tinnitus or metallic taste in mouth nor signs of intrathecal spread such as rapid motor block. Please see nursing notes for vital signs.

## 2011-07-11 NOTE — Anesthesia Preprocedure Evaluation (Signed)
Anesthesia Evaluation  Name, MR# and DOB Patient awake  General Assessment Comment  Reviewed: Allergy & Precautions, H&P  and Patient's Chart, lab work & pertinent test results  Airway Mallampati: II TM Distance: >3 FB Neck ROM: full    Dental No notable dental hx    Pulmonaryneg pulmonary ROS    clear to auscultation  pulmonary exam normal   Cardiovascular regular Normal   Neuro/Psych (+) {AN ROS/MED HX NEURO HEADACHES (+) PSYCHIATRIC DISORDERS, Negative Neurological ROS Negative Psych ROS  GI/Hepatic/Renal negative GI ROS, negative Liver ROS, and negative Renal ROS (+)  GERD      Endo/Other  Negative Endocrine ROS (+)   Abdominal   Musculoskeletal  Hematology negative hematology ROS (+)   Peds  Reproductive/Obstetrics (+) Pregnancy   Anesthesia Other Findings             Anesthesia Physical Anesthesia Plan  ASA: II  Anesthesia Plan: Epidural   Post-op Pain Management:    Induction:   Airway Management Planned:   Additional Equipment:   Intra-op Plan:   Post-operative Plan:   Informed Consent: I have reviewed the patients History and Physical, chart, labs and discussed the procedure including the risks, benefits and alternatives for the proposed anesthesia with the patient or authorized representative who has indicated his/her understanding and acceptance.     Plan Discussed with:   Anesthesia Plan Comments:         Anesthesia Quick Evaluation

## 2011-07-11 NOTE — Progress Notes (Signed)
ROM 0100, few contractions

## 2011-07-11 NOTE — Transfer of Care (Signed)
Immediate Anesthesia Transfer of Care Note  Patient: Gloria Rodriguez  Procedure(s) Performed:  CESAREAN SECTION - Repeat  Patient Location: PACU  Anesthesia Type: Epidural  Level of Consciousness: awake, alert  and oriented  Airway & Oxygen Therapy: Patient Spontanous Breathing  Post-op Assessment: Report given to PACU RN and Post -op Vital signs reviewed and stable  Post vital signs: Reviewed and stable  Complications: No apparent anesthesia complications

## 2011-07-12 LAB — CBC
MCH: 29.1 pg (ref 26.0–34.0)
MCV: 88.4 fL (ref 78.0–100.0)
Platelets: 161 10*3/uL (ref 150–400)
RDW: 16.8 % — ABNORMAL HIGH (ref 11.5–15.5)

## 2011-07-12 MED ORDER — VALACYCLOVIR HCL 500 MG PO TABS
500.0000 mg | ORAL_TABLET | Freq: Two times a day (BID) | ORAL | Status: DC
  Administered 2011-07-13 – 2011-07-14 (×4): 500 mg via ORAL
  Filled 2011-07-12 (×4): qty 1

## 2011-07-12 MED ORDER — HYDROMORPHONE HCL 2 MG PO TABS
2.0000 mg | ORAL_TABLET | Freq: Four times a day (QID) | ORAL | Status: DC | PRN
  Administered 2011-07-12 – 2011-07-14 (×9): 2 mg via ORAL
  Filled 2011-07-12 (×10): qty 1

## 2011-07-12 NOTE — Progress Notes (Signed)
  S:         Reports feeling ok - intense itching since surgery - benadryl just makes her sleepy             Tolerating po intake / no nausea / no vomiting / some flatus / no BM             Bleeding is light             Pain controlled withnarcotic analgesia             Up ad lib / ambulatory  Newborn breast feeding  / Circumcision not indicated - girl   O:  A & O x 3 NAD              VS: Blood pressure 104/66, pulse 80, temperature 98.4 F (36.9 C), temperature source Oral, resp. rate 17, height 5' 5.5" (1.664 m), weight 94.802 kg (209 lb), SpO2 96.00%, unknown if currently breastfeeding.  LABS:  Basename 07/12/11 0500 07/11/11 0317  HGB 10.8* 12.8  HCT 32.8* 37.6    I&O: I/O last 3 completed shifts: In: 5517.1 [P.O.:1300; I.V.:4217.1] Out: 2500 [Urine:1900; Blood:600]  NET : + 3000      Lungs: Clear and unlabored  Heart: regular rate and rhythm / no mumurs  Abdomen: soft, non-tender, non-distended active bowel sounds - hypoactive             Fundus: firm, non-tender, Ueven             Dressing intact without drainage              Lochia: light  Extremities: no edema, no calf pain or tenderness, negative Homans  A:        POD # 1 S/P cesarean section - girl breastfeeding              P:        Routine postoperative care              Advance activity today             Shower with dressing removal     BAILEY,TANYA 07/12/2011, 9:04 AM

## 2011-07-12 NOTE — Progress Notes (Signed)
Pt c/o increasing pain of 4 out of 10.  Pt states that ibuprofen is not completely helping her pain; however she has allergies to many pain medications in which she "has a problem with sodium binding in tablet form that causes severe migraines."  Pt states she can take naproxen.  Notified Dr. Juliene Pina to discuss pain control and meds.  Dr. Juliene Pina questioned if patient could take dilaudid since naproxen was not ideal while also taking ibuprofen.  Pt stated that she has taken dilaudid and does not remember having any problems with it.  Dr. Juliene Pina ordered dilaudid 2mg  po q6hours prn severe pain.  Will continue to monitor.  Earl Gala, Linda Hedges [place]

## 2011-07-12 NOTE — Progress Notes (Signed)
Mom requesting Comfort Gels.  Bruising noted on Mom's nipples bilaterally.  Mom reports that nipples are misshapened after latching.  Mom given tips on how to widen baby's gape. Lurline [place]

## 2011-07-12 NOTE — Anesthesia Postprocedure Evaluation (Signed)
  Anesthesia Post-op Note  Patient: Gloria Rodriguez  Procedure(s) Performed:  CESAREAN SECTION - Repeat  Patient Location: PACU and Mother/Baby  Anesthesia Type: Regional  Level of Consciousness: awake, alert  and oriented  Airway and Oxygen Therapy: Patient Spontanous Breathing  Post-op Pain: mild  Post-op Assessment: Post-op Vital signs reviewed  Post-op Vital Signs: Reviewed and stable  Complications: No apparent anesthesia complications

## 2011-07-12 NOTE — Progress Notes (Signed)
Encounter addended by: Isabella Bowens on: 07/12/2011  7:51 AM<BR>     Documentation filed: Notes Section

## 2011-07-12 NOTE — Progress Notes (Signed)
Encounter addended by: Isabella Bowens on: 07/12/2011 10:36 AM<BR>     Documentation filed: Charges VN

## 2011-07-13 ENCOUNTER — Encounter (HOSPITAL_COMMUNITY): Payer: Self-pay

## 2011-07-13 NOTE — Progress Notes (Signed)
  Subjective: POD# 2 s/p Cesarean Delivery.  Indications: fetal distress and previous cesarean section                Information for the patient's newborn:  Aymar, Whitfill Girl Keeghan [[id number]]  female    Feeding: breast Patient reports tolerating PO. Pain controlled with Dilaudid and Ibuprofen. Denies HA/SOB/C/P/N/V/dizziness.  Reports flatus. Breast symptoms:none  She reports vaginal bleeding as normal, without clots.  She is ambulating, urinating without difficult.     Objective:  VS: Blood pressure 104/68, pulse 82, temperature 97.5 F (36.4 C), temperature source Oral, resp. rate 16, height 5' 5.5" (1.664 m), weight 209 lb (94.802 kg), SpO2 96.00%  No intake or output data in the 24 hours ending 07/13/11 1107      Basename 07/12/11 0500 07/11/11 0317  WBC 17.8* 11.5*  HGB 10.8* 12.8  HCT 32.8* 37.6  PLT 161 193     Blood type: O POS (07/31 0316)  Rubella: Immune (07/31 0223)     Physical Exam:  General: alert, cooperative, no distress and mildly obese CV: Regular rate and rhythm Resp: clear Abdomen: soft, nontender, normal bowel sounds Incision: clean, dry and intact Uterine Fundus: firm, below umbilicus, nontender Lochia: minimal Ext: no edema, redness or tenderness in the calves or thighs      Assessment/Plan: 35 y.o.  status post Cesarean section. POD# 2.   Doing well, stable.              Pain well controlled with Dilaudid  Ambulate Routine post-op care Anticipate discharge in AM  [facility] 07/13/2011, 11:07 AM

## 2011-07-13 NOTE — Consult Note (Signed)
SN. Has comfort gels.  Assisted with latch.  Follow up tomorrow.

## 2011-07-14 MED ORDER — VALACYCLOVIR HCL 500 MG PO TABS: 500.0000 mg | ORAL_TABLET | Freq: Two times a day (BID) | ORAL | Status: DC

## 2011-07-14 MED ORDER — IBUPROFEN 600 MG PO TABS: 600.0000 mg | ORAL_TABLET | Freq: Four times a day (QID) | ORAL | Status: AC | PRN

## 2011-07-14 MED ORDER — HYDROMORPHONE HCL 2 MG PO TABS: 2.0000 mg | ORAL_TABLET | Freq: Four times a day (QID) | ORAL | Status: AC | PRN

## 2011-07-14 NOTE — Progress Notes (Signed)
Reviewed engorgement tx. If needed . Discussed small infants  And potentiallly inconsistent , reminded mom if infant unable to feed both breast don't allow 2nd breast to become firm ,release pressure by pumping .

## 2011-07-14 NOTE — Plan of Care (Signed)
Problem: Discharge Progression Outcomes Goal: Remove staples per MD order Outcome: Adequate for Discharge Staples to be removed in MD office

## 2011-07-14 NOTE — Discharge Summary (Signed)
Patient ID: Gloria Rodriguez MRN: [id number] DOB/AGE: 1976/04/07 35 y.o.  Admit date: 07/11/2011 Discharge date:  07/14/2011  Admission Diagnoses: 1. IUP at 37w 6d with SROM 2. TOLAC and augmentation of labor  Discharge Diagnoses:  Active Problems:  Postpartum care following cesarean delivery  Postpartum care and examination of lactating mother   Prenatal history:  Gravida 2 para1 at 37w 6d gestation with [facility] of 07/26/11 Prenatal care at [facility] since [redacted] weeks gestation with Dr Seymour Bars as primary provider.   Prenatal course complicated by anxiety, depression, interstitial cystitis, HSV, GERD  Prenatal Labs: ABO, Rh: O POS (07/31 0316) Antibody: Negative (07/31 0223) Rubella:  immune RPR: NON REACTIVE (07/31 0317)  HBsAg: Negative (07/31 0224)  HIV: Non-reactive (07/31 0224)  GBS: Negative (07/31 0224)  1 hr GTT: 132   Medical / Surgical History : Past Medical History  Diagnosis Date  . Anxiety   . Depression   . Headache   . GERD (gastroesophageal reflux disease)   . HSV infection     on Valtrex suppression  . Postpartum care and examination of lactating mother 07/13/2011   Past Surgical History  Procedure Date  . Cesarean section    Social History:  reports that she has never smoked. She does not have any smokeless tobacco history on file. She reports that she does not drink alcohol or use illicit drugs.  Allergies: Codeine; Hydrocodone; Oxycodone; Ultram; and Vicodin   Current Medications at time of admission:  Prescriptions prior to admission  Medication Sig Dispense Refill  . Prenatal Vit-Fe Psac Cmplx-FA (PRENATAL MULTIVITAMIN) 60-1 MG tablet Take 1 tablet by mouth daily with breakfast.        . ranitidine (ZANTAC) 150 MG tablet Take 150 mg by mouth 2 (two) times daily.        . sertraline (ZOLOFT) 100 MG tablet Take 100 mg by mouth daily.             Procedures: Cesarean section delivery of female newborn by Dr Seymour Bars  See operative  report for further details  Postoperative / postpartum course: SW consult due to anxiety and depression  Physical Exam:  VSS: Blood pressure 104/69, pulse 76, temperature 97.9 F (36.6 C), temperature source Oral, resp. rate 18   LABS:  Lab Results  Component Value Date   HGB 10.8* 07/12/2011                             Lab Results  Component Value Date   PLT 161 07/12/2011    Incision:  approximated with staples / no erythema / mod ecchymosis / no drainage Staples: C/D/I  Discharge Instructions:  Postpartum Instructions: per [facility] Booklet - given to patient  Discharged Condition: stable  Diet: Regular diet with adequate water hydration.  Discharge Orders    Future Orders Please Complete By Expires   Diet general      Activity as tolerated      Call MD for:      Comments:   Any new onset headaches, blurred vision, or epigastric pain.   Call MD for:  temperature >100.4      Call MD for:  persistant nausea and vomiting      Call MD for:  severe uncontrolled pain      Call MD for:  redness, tenderness, or signs of infection (pain, swelling, redness, odor or green/yellow discharge around incision site)  Discharge instructions      Comments:   The [facility] post partum booklet will be given to patient.    Strep B DNA probe      Comments:   This external order was created through the Results Console.   HIV antibody      Comments:   This external order was created through the Results Console.   Rubella antibody, IgM      Comments:   This external order was created through the Results Console.   Hepatitis B surface antigen      Comments:   This external order was created through the Results Console.   RPR      Comments:   This external order was created through the Results Console.   Type and screen      Comments:   This external order was created through the Results Console.   ABO/Rh      Comments:   This external order was created through the  Results Console.     Current Discharge Medication List    START taking these medications   Details  HYDROmorphone (DILAUDID) 2 MG tablet Take 1 tablet (2 mg total) by mouth every 6 (six) hours as needed. Qty: 30 tablet, Refills: 0    ibuprofen (ADVIL,MOTRIN) 600 MG tablet Take 1 tablet (600 mg total) by mouth every 6 (six) hours as needed for pain. Qty: 30 tablet, Refills: 1      CONTINUE these medications which have CHANGED   Details  valACYclovir (VALTREX) 500 MG tablet Take 1 tablet (500 mg total) by mouth 2 (two) times daily. Qty: 30 tablet, Refills: 1      CONTINUE these medications which have NOT CHANGED   Details  Prenatal Vit-Fe Psac Cmplx-FA (PRENATAL MULTIVITAMIN) 60-1 MG tablet Take 1 tablet by mouth daily with breakfast.      ranitidine (ZANTAC) 150 MG tablet Take 150 mg by mouth 2 (two) times daily.      sertraline (ZOLOFT) 100 MG tablet Take 100 mg by mouth daily.           Disposition: to home with infant Staple removal in office on 8/7 2012 @ 2:30pm  Signed: Joshoa Shawler K 07/14/2011, 11:11 AM

## 2011-07-14 NOTE — Progress Notes (Signed)
  POD # 3  Subjective: Pt reports feeling well, very sore, but eager for d/c home.  Complains of incessant interruptions by staff and providers during this admission/ Pain controlled with prescription NSAID's including motrin and percocet Tolerating po/Voiding without problems/ No n/v/Flatus pos/Pt  Activity: ad lib Bleeding is light Newborn info:  Information for the patient's newborn:  Frost, Girl Misha [[id number]]  female  / Feeding: breast   Objective: VS: Blood pressure 104/69, pulse 76, temperature 97.9 F (36.6 C), temperature source Oral, resp. rate 18   Physical Exam:  General: alert CV: Regular rate and rhythm Resp: clear Abdomen: soft, nontender, normal bowel sounds Incision: healing well, no drainage, no erythema, well approximated, moderate ecchymosis Uterine Fundus: firm, below umbilicus, nontender Lochia: minimal Ext: extremities normal, atraumatic, no cyanosis or edema and Homans sign is negative, no sign of DVT    A/P: POD # 3/ G2P2002/ Repeat c/s d/t FITL Doing well and stable for discharge home RX: .dilaudid 2mg  po Q 6 hrs prn #30 no refill Ibuprofen 600mg  po q6hrs prn Colace 100 mg 1 to 2 times per day prn

## 2011-09-05 ENCOUNTER — Encounter (HOSPITAL_COMMUNITY): Payer: Self-pay | Admitting: Obstetrics & Gynecology

## 2011-10-09 ENCOUNTER — Emergency Department (HOSPITAL_COMMUNITY)
Admission: EM | Admit: 2011-10-09 | Discharge: 2011-10-10 | Disposition: A | Discharge status: Home / Self Care | Payer: Managed Care, Other (non HMO) | Attending: Emergency Medicine | Admitting: Emergency Medicine

## 2011-10-09 ENCOUNTER — Emergency Department (HOSPITAL_COMMUNITY): Payer: Managed Care, Other (non HMO)

## 2011-10-09 DIAGNOSIS — R109 Unspecified abdominal pain: Secondary | ICD-10-CM | POA: Insufficient documentation

## 2011-10-09 DIAGNOSIS — F3289 Other specified depressive episodes: Secondary | ICD-10-CM | POA: Insufficient documentation

## 2011-10-09 DIAGNOSIS — R748 Abnormal levels of other serum enzymes: Secondary | ICD-10-CM | POA: Insufficient documentation

## 2011-10-09 DIAGNOSIS — R112 Nausea with vomiting, unspecified: Secondary | ICD-10-CM | POA: Insufficient documentation

## 2011-10-09 DIAGNOSIS — F329 Major depressive disorder, single episode, unspecified: Secondary | ICD-10-CM | POA: Insufficient documentation

## 2011-10-09 DIAGNOSIS — R12 Heartburn: Secondary | ICD-10-CM | POA: Insufficient documentation

## 2011-10-09 DIAGNOSIS — K8020 Calculus of gallbladder without cholecystitis without obstruction: Secondary | ICD-10-CM | POA: Insufficient documentation

## 2011-10-09 DIAGNOSIS — Z79899 Other long term (current) drug therapy: Secondary | ICD-10-CM | POA: Insufficient documentation

## 2011-10-09 DIAGNOSIS — R197 Diarrhea, unspecified: Secondary | ICD-10-CM | POA: Insufficient documentation

## 2011-10-09 DIAGNOSIS — F988 Other specified behavioral and emotional disorders with onset usually occurring in childhood and adolescence: Secondary | ICD-10-CM | POA: Insufficient documentation

## 2011-10-09 LAB — COMPREHENSIVE METABOLIC PANEL
ALT: 516 U/L — ABNORMAL HIGH (ref 0–35)
Calcium: 10 mg/dL (ref 8.4–10.5)
Creatinine, Ser: 0.86 mg/dL (ref 0.50–1.10)
GFR calc Af Amer: >90 mL/min (ref >90)
Glucose, Bld: 118 mg/dL — ABNORMAL HIGH (ref 70–99)
Sodium: 138 mEq/L (ref 135–145)
Total Protein: 7.9 g/dL (ref 6.0–8.3)

## 2011-10-09 LAB — URINALYSIS, ROUTINE W REFLEX MICROSCOPIC
Glucose, UA: NEGATIVE mg/dL
Leukocytes, UA: NEGATIVE
Nitrite: NEGATIVE
Protein, ur: 30 mg/dL — AB
Urobilinogen, UA: 0.2 mg/dL (ref 0.0–1.0)

## 2011-10-09 LAB — DIFFERENTIAL
Basophils Relative: 0 % (ref 0–1)
Eosinophils Absolute: 0 10*3/uL (ref 0.0–0.7)
Monocytes Absolute: 0.6 10*3/uL (ref 0.1–1.0)
Monocytes Relative: 5 % (ref 3–12)

## 2011-10-09 LAB — POCT PREGNANCY, URINE: Preg Test, Ur: NEGATIVE

## 2011-10-09 LAB — URINE MICROSCOPIC-ADD ON

## 2011-10-09 LAB — CBC
MCH: 28.6 pg (ref 26.0–34.0)
MCHC: 33.1 g/dL (ref 30.0–36.0)
Platelets: 263 10*3/uL (ref 150–400)

## 2011-10-11 LAB — HEPATITIS PANEL, ACUTE
Hep A IgM: NEGATIVE
Hep B C IgM: NEGATIVE

## 2011-10-23 ENCOUNTER — Encounter (INDEPENDENT_AMBULATORY_CARE_PROVIDER_SITE_OTHER): Payer: Self-pay | Admitting: Surgery

## 2011-10-23 ENCOUNTER — Ambulatory Visit (INDEPENDENT_AMBULATORY_CARE_PROVIDER_SITE_OTHER): Payer: Managed Care, Other (non HMO) | Admitting: Surgery

## 2011-10-23 VITALS — BP 100/74 | HR 68 | Temp 97.6°F | Resp 12 | Ht 65.5 in | Wt 201.8 lb

## 2011-10-23 DIAGNOSIS — K8010 Calculus of gallbladder with chronic cholecystitis without obstruction: Secondary | ICD-10-CM

## 2011-10-23 MED ORDER — HYDROCODONE-ACETAMINOPHEN 7.5-500 MG/15ML PO SOLN
15.0000 mL | Freq: Four times a day (QID) | ORAL | Status: AC | PRN
Start: 2011-10-23 — End: 2011-11-02

## 2011-10-23 NOTE — Patient Instructions (Signed)
Laparoscopic Cholecystectomy Care After Refer to this sheet in the next few weeks. These instructions provide you with information on caring for yourself after your procedure. Your caregiver may also give you more specific instructions. Your treatment has been planned according to current medical practices, but problems sometimes occur. Call your caregiver if you have any problems or questions after your procedure. HOME CARE INSTRUCTIONS   Change bandages (dressings) as directed by your caregiver.   Keep the wound dry and clean. The wound may be washed gently with soap and water. Gently blot or dab the area dry.   Do not take baths or use swimming pools or hot tubs for 10 days, or as instructed by your caregiver.   Only take over-the-counter or prescription medicines for pain, discomfort, or fever as directed by your caregiver.   Continue your normal diet as directed by your caregiver.   Do not lift anything heavier than 25 pounds (11.5 kg), or as directed by your caregiver.   Do not play contact sports for 1 week, or as directed by your caregiver.  SEEK MEDICAL CARE IF:   There is redness, swelling, or increasing pain in the wound.   You notice yellowish-white fluid (pus) coming from the wound.   There is drainage from the wound that lasts longer than 1 day.   There is a bad smell coming from the wound or dressing.   The surgical cut (incision) breaks open.  SEEK IMMEDIATE MEDICAL CARE IF:   You develop a rash.   You have difficulty breathing.   You develop chest pain.   You develop any reaction or side effects to medicines given.   You have a fever.   You have increasing pain in the shoulders (shoulder strap areas).   You have dizzy episodes or faint while standing.   You develop severe abdominal pain.   You feel sick to your stomach (nauseous) or throw up (vomit) and this lasts for more than 1 day.  MAKE SURE YOU:   Understand these instructions.   Will watch  your condition.   Will get help right away if you are not doing well or get worse.  Document Released: 11/27/2005 Document Revised: 08/09/2011 Document Reviewed: 05/12/2011 Exit[employer] Patient Information 2012 [employer].Laparoscopic Cholecystectomy Laparoscopic cholecystectomy is surgery to remove the gallbladder. The gallbladder is located slightly to the right of center in the abdomen, behind the liver. It is a concentrating and storage sac for the bile produced in the liver. Bile aids in the digestion and absorption of fats. Gallbladder disease (cholecystitis) is an inflammation of your gallbladder. This condition is usually caused by a buildup of gallstones (cholelithiasis) in your gallbladder. Gallstones can block the flow of bile, resulting in inflammation and pain. In severe cases, emergency surgery may be required. When emergency surgery is not required, you will have time to prepare for the procedure. Laparoscopic surgery is an alternative to open surgery. Laparoscopic surgery usually has a shorter recovery time. Your common bile duct may also need to be examined and explored. Your caregiver will discuss this with you if he or she feels this should be done. If stones are found in the common bile duct, they may be removed. LET YOUR CAREGIVER KNOW ABOUT:  Allergies to food or medicine.   Medicines taken, including vitamins, herbs, eyedrops, over-the-counter medicines, and creams.   Use of steroids (by mouth or creams).   Previous problems with anesthetics or numbing medicines.   History of bleeding problems or blood   clots.   Previous surgery.   Other health problems, including diabetes and kidney problems.   Possibility of pregnancy, if this applies.  RISKS AND COMPLICATIONS All surgery is associated with risks. Some problems that may occur following this procedure include:  Infection.   Damage to the common bile duct, nerves, arteries, veins, or other internal organs such as  the stomach or intestines.   Bleeding.   A stone may remain in the common bile duct.  BEFORE THE PROCEDURE  Do not take aspirin for 3 days prior to surgery or blood thinners for 1 week prior to surgery.   Do not eat or drink anything after midnight the night before surgery.   Let your caregiver know if you develop a cold or other infectious problem prior to surgery.   You should be present 60 minutes before the procedure or as directed.  PROCEDURE  You will be given medicine that makes you sleep (general anesthetic). When you are asleep, your surgeon will make several small cuts (incisions) in your abdomen. One of these incisions is used to insert a small, lighted scope (laparoscope) into the abdomen. The laparoscope helps the surgeon see into your abdomen. Carbon dioxide gas will be pumped into your abdomen. The gas allows more room for the surgeon to perform your surgery. Other operating instruments are inserted through the other incisions. Laparoscopic procedures may not be appropriate when:  There is major scarring from previous surgery.   The gallbladder is extremely inflamed.   There are bleeding disorders or unexpected cirrhosis of the liver.   A pregnancy is near term.   Other conditions make the laparoscopic procedure impossible.  If your surgeon feels it is not safe to continue with a laparoscopic procedure, he or she will perform an open abdominal procedure. In this case, the surgeon will make an incision to open the abdomen. This gives the surgeon a larger view and field to work within. This may allow the surgeon to perform procedures that sometimes cannot be performed with a laparoscope alone. Open surgery has a longer recovery time. AFTER THE PROCEDURE  You will be taken to the recovery area where a nurse will watch and check your progress.   You may be allowed to go home the same day.   Do not resume physical activities until directed by your caregiver.   You may  resume a normal diet and activities as directed.  Document Released: 11/27/2005 Document Revised: 08/09/2011 Document Reviewed: 05/12/2011 Exit[employer] Patient Information 2012 [employer]. 

## 2011-10-23 NOTE — Progress Notes (Signed)
Chief Complaint  Patient presents with  . Other    new pt- eval gb     HPI Gloria Rodriguez is a 35 y.o. female.   HPI The patient presents with a chief complaint of abdominal pain and back pain. She is about 3 months postpartum. She seen the emergency room and found to have gallstones by ultrasound. She had mild elevation of her lipase. She had significant elevation of her SGOT and SGPT. She felt better after seeing the emergency room was sent home with outpatient followup arranged. She complained of epigastric pain. It lasted 8 hours. It radiated to her back. When she received pain medicine emergency room her symptoms were relieved. Hepatitis panel was negative. She is feeling better but still has some residual epigastric discomfort and back pain. It is mild in nature. It is sharp. She also is a low bloating and gas. She has intolerance to codeine which gives her migraines. She has intolerance to oxycodone hydrocodone again these give her migraine headaches after prolonged use which he thinks is from the tablet form of these medications. She has never had any anaphylaxis to oxycodone or hydrocodone. She was given DILAUDID after her C-section. She took this for about a week to develop migraines as well. She's been breast-feeding but does that at times intermittently. Past Medical History  Diagnosis Date  . Anxiety   . Depression   . Headache   . HSV infection     on Valtrex suppression  . Postpartum care and examination of lactating mother 07/13/2011  . GERD (gastroesophageal reflux disease)     pregnancy related    Past Surgical History  Procedure Date  . Cesarean section   . Cesarean section 07/11/2011    Procedure: CESAREAN SECTION;  Surgeon: Marie-Lyne Lavoie;  Location: [facility];  Service: Gynecology;  Laterality: N/A;  Repeat    Family History  Problem Relation Age of Onset  . Heart disease Maternal Grandmother     Social History History  Substance Use Topics  . Smoking status:  Never Smoker   . Smokeless tobacco: Not on file  . Alcohol Use: Yes    Allergies  Allergen Reactions  . Codeine Other (See Comments)    migraines  . Hydrocodone Other (See Comments)    migraines  . Oxycodone Other (See Comments)    migraines  . Ultram (Tramadol Hcl) Other (See Comments)    Migraines/nightmares  . Vicodin (Hydrocodone-Acetaminophen) Other (See Comments)    migraines    Current Outpatient Prescriptions  Medication Sig Dispense Refill  . HYDROcodone-acetaminophen (LORTAB) 7.5-500 MG/15ML solution Take 15 mLs by mouth every 6 (six) hours as needed for pain.  120 mL  1  . Prenatal Vit-Fe Psac Cmplx-FA (PRENATAL MULTIVITAMIN) 60-1 MG tablet Take 1 tablet by mouth daily with breakfast.        . sertraline (ZOLOFT) 100 MG tablet Take 100 mg by mouth daily.        . valACYclovir (VALTREX) 500 MG tablet Take 1 tablet (500 mg total) by mouth 2 (two) times daily.  30 tablet  1    Review of Systems Review of Systems  Constitutional: Positive for fatigue. Negative for activity change and appetite change.  HENT: Negative.   Eyes: Negative.   Respiratory: Negative.   Cardiovascular: Negative.   Gastrointestinal: Positive for nausea, vomiting, abdominal pain and blood in stool. Negative for abdominal distention.  Genitourinary: Negative.   Musculoskeletal: Negative.   Neurological: Negative.   Hematological: Negative.     Psychiatric/Behavioral: Negative.     Blood pressure 100/74, pulse 68, temperature 97.6 F (36.4 C), temperature source Temporal, resp. rate 12, height 5' 5.5" (1.664 m), weight 201 lb 12.8 oz (91.536 kg).  Physical Exam Physical Exam  Constitutional: She appears well-developed and well-nourished.  HENT:  Head: Normocephalic and atraumatic.  Eyes: EOM are normal. Pupils are equal, round, and reactive to light.  Neck: Normal range of motion. Neck supple.  Cardiovascular: Normal rate and regular rhythm.   Pulmonary/Chest: Effort normal and breath  sounds normal.  Abdominal: Soft. Bowel sounds are normal.       Minimal right upper quadrant tenderness. Negative Murphy sign. No mass.  Skin: Skin is warm and dry.  Psychiatric: She has a normal mood and affect. Her behavior is normal. Judgment and thought content normal.    Data Reviewed  Ultrasound from 1029 2012 shows gallstones. No gallbladder wall thickening. Common bile duct non-dilated. SGOT SGPT greater than 600. Lipase greater than 100. Total bilirubin normal.Hepatitis profile negative Assessment    Chronic cholecystitis with possible mild pancreatitis. Elevated LFTS    Plan    I reviewed her lab work and noted to the emergency room. She may have passed some gallstones. His back out for her mild elevation of her lipase and transaminases. She is not jaundiced today and feels better. I do feel she will benefit from laparoscopic cholecystectomy with cholangiogram. Medical alternative also given the patient for treatment of gallstones. The potential benefit of surgery this case is over 90%. She understands the above like proceed.The procedure has been discussed with the patient. Operative and non operative treatments have been discussed. Risks of surgery include bleeding, iInfection,  Common bile duct injury,  Injury to the stomach,liver, colon,small intestine, abdominal wall,  Diaphragm,  Major blood vessels,  And the need for an open procedure.  Other risks include worsening of medical problems, death,  DVT and pulmonary embolism, and cardiovascular events.   Medical options have also been discussed. The patient has been informed of long term expectations of surgery and non surgical options,  The patient agrees to proceed.         Haldon Carley A. 10/23/2011, 3:58 PM    

## 2011-11-01 ENCOUNTER — Encounter (HOSPITAL_COMMUNITY): Payer: Self-pay | Admitting: Pharmacy Technician

## 2011-11-06 ENCOUNTER — Telehealth (INDEPENDENT_AMBULATORY_CARE_PROVIDER_SITE_OTHER): Payer: Self-pay

## 2011-11-06 NOTE — Telephone Encounter (Signed)
Called to check up on patient per blue line. Patient called on call doctor over weekend with abdominal pain. She is scheduled for surgery on Dec 4th. Told patient to call us back to let us know if she was still in pain.

## 2011-11-06 NOTE — Telephone Encounter (Signed)
Patient called back and said she has not had an episode since Saturday and that she will try and hold out to her surgery date. She will stay away from fatty foods and call us should she have another episode or has any questions

## 2011-11-08 ENCOUNTER — Encounter (HOSPITAL_COMMUNITY): Payer: Self-pay

## 2011-11-08 ENCOUNTER — Encounter (HOSPITAL_COMMUNITY)
Admission: RE | Admit: 2011-11-08 | Discharge: 2011-11-08 | Disposition: A | Discharge status: Home / Self Care | Payer: Managed Care, Other (non HMO) | Source: Ambulatory Visit | Attending: Surgery | Admitting: Surgery

## 2011-11-08 ENCOUNTER — Other Ambulatory Visit: Payer: Self-pay

## 2011-11-08 ENCOUNTER — Ambulatory Visit (HOSPITAL_COMMUNITY)
Admission: RE | Admit: 2011-11-08 | Discharge: 2011-11-08 | Disposition: A | Discharge status: Home / Self Care | Payer: Managed Care, Other (non HMO) | Source: Ambulatory Visit | Attending: Surgery | Admitting: Surgery

## 2011-11-08 DIAGNOSIS — Z01810 Encounter for preprocedural cardiovascular examination: Secondary | ICD-10-CM | POA: Insufficient documentation

## 2011-11-08 DIAGNOSIS — K859 Acute pancreatitis without necrosis or infection, unspecified: Secondary | ICD-10-CM | POA: Insufficient documentation

## 2011-11-08 DIAGNOSIS — K8020 Calculus of gallbladder without cholecystitis without obstruction: Secondary | ICD-10-CM | POA: Insufficient documentation

## 2011-11-08 DIAGNOSIS — Z01811 Encounter for preprocedural respiratory examination: Secondary | ICD-10-CM | POA: Insufficient documentation

## 2011-11-08 DIAGNOSIS — Z01812 Encounter for preprocedural laboratory examination: Secondary | ICD-10-CM | POA: Insufficient documentation

## 2011-11-08 HISTORY — DX: Other specified health status: Z78.9

## 2011-11-08 HISTORY — DX: Chronic kidney disease, unspecified: N18.9

## 2011-11-08 HISTORY — DX: Sleep apnea, unspecified: G47.30

## 2011-11-08 LAB — COMPREHENSIVE METABOLIC PANEL
ALT: 16 U/L (ref 0–35)
AST: 21 U/L (ref 0–37)
Alkaline Phosphatase: 98 U/L (ref 39–117)
CO2: 25 mEq/L (ref 19–32)
Chloride: 102 mEq/L (ref 96–112)
GFR calc Af Amer: >90 mL/min (ref >90)
GFR calc non Af Amer: 82 mL/min — ABNORMAL LOW (ref >90)
Glucose, Bld: 88 mg/dL (ref 70–99)
Potassium: 4 mEq/L (ref 3.5–5.1)
Sodium: 140 mEq/L (ref 135–145)
Total Bilirubin: 0.2 mg/dL — ABNORMAL LOW (ref 0.3–1.2)

## 2011-11-08 LAB — SURGICAL PCR SCREEN: MRSA, PCR: NEGATIVE

## 2011-11-08 LAB — CBC
Hemoglobin: 13.6 g/dL (ref 12.0–15.0)
MCH: 28.2 pg (ref 26.0–34.0)
Platelets: 261 10*3/uL (ref 150–400)
RBC: 4.83 MIL/uL (ref 3.87–5.11)
WBC: 9.2 10*3/uL (ref 4.0–10.5)

## 2011-11-08 LAB — DIFFERENTIAL
Basophils Absolute: 0.1 10*3/uL (ref 0.0–0.1)
Eosinophils Absolute: 0.2 10*3/uL (ref 0.0–0.7)
Eosinophils Relative: 2 % (ref 0–5)
Lymphocytes Relative: 29 % (ref 12–46)
Monocytes Absolute: 0.6 10*3/uL (ref 0.1–1.0)

## 2011-11-08 NOTE — Pre-Procedure Instructions (Addendum)
20 Gloria Rodriguez  11/08/2011   Your procedure is scheduled on: 11/14/11    0830-1000  Tuesday  Report to [facility]   0600 AM.  Call this number if you have problems the morning of surgery: [phone]              Deliah Goody   [id number]  Remember:   Do not eat food:After Midnight.  Monday night  May have clear liquids:until Midnight .  Monday night  Clear liquids include soda, tea, black coffee, apple or grape juice, broth.  Take these medicines the morning of surgery with A SIP OF WATER:Zoloft with sip water              Stop aspirin or antiinflammatories 5 days pre surgery  Do not wear jewelry, make-up or nail polish.  Do not wear lotions, powders, or perfumes. You may wear deodorant.  Do not shave 48 hours prior to surgery.  Do not bring valuables to the hospital.  Contacts, dentures or bridgework may not be worn into surgery.  Leave suitcase in the car. After surgery it may be brought to your room.  For patients admitted to the hospital, checkout time is 11:00 AM the day of discharge.   Patients discharged the day of surgery will not be allowed to drive home.  Name and phone number of your driver: husband  Special Instructions: CHG Shower Use Special Wash: 1/2 bottle night before surgery and 1/2 bottle morning of surgery. Regular soap face and privates, no shaving x 48 hours  Please read over the following fact sheets that you were given: MRSA Information

## 2011-11-13 ENCOUNTER — Encounter (HOSPITAL_COMMUNITY): Payer: Self-pay | Admitting: Surgery

## 2011-11-14 ENCOUNTER — Encounter (HOSPITAL_COMMUNITY): Payer: Self-pay | Admitting: *Deleted

## 2011-11-14 ENCOUNTER — Encounter (HOSPITAL_COMMUNITY)
Admission: RE | Disposition: A | Discharge status: Home / Self Care | Payer: Self-pay | Source: Ambulatory Visit | Attending: Surgery

## 2011-11-14 ENCOUNTER — Ambulatory Visit (HOSPITAL_COMMUNITY)
Admission: RE | Admit: 2011-11-14 | Discharge: 2011-11-15 | Disposition: A | Discharge status: Home / Self Care | Payer: Managed Care, Other (non HMO) | Source: Ambulatory Visit | Attending: Surgery | Admitting: Surgery

## 2011-11-14 ENCOUNTER — Ambulatory Visit (HOSPITAL_COMMUNITY): Payer: Managed Care, Other (non HMO) | Admitting: Anesthesiology

## 2011-11-14 ENCOUNTER — Other Ambulatory Visit (INDEPENDENT_AMBULATORY_CARE_PROVIDER_SITE_OTHER): Payer: Self-pay | Admitting: Surgery

## 2011-11-14 ENCOUNTER — Encounter (HOSPITAL_COMMUNITY): Payer: Self-pay | Admitting: Anesthesiology

## 2011-11-14 ENCOUNTER — Ambulatory Visit (HOSPITAL_COMMUNITY): Payer: Managed Care, Other (non HMO)

## 2011-11-14 DIAGNOSIS — R7989 Other specified abnormal findings of blood chemistry: Secondary | ICD-10-CM | POA: Insufficient documentation

## 2011-11-14 DIAGNOSIS — B009 Herpesviral infection, unspecified: Secondary | ICD-10-CM | POA: Insufficient documentation

## 2011-11-14 DIAGNOSIS — K219 Gastro-esophageal reflux disease without esophagitis: Secondary | ICD-10-CM | POA: Insufficient documentation

## 2011-11-14 DIAGNOSIS — K8010 Calculus of gallbladder with chronic cholecystitis without obstruction: Secondary | ICD-10-CM

## 2011-11-14 DIAGNOSIS — Z9889 Other specified postprocedural states: Secondary | ICD-10-CM

## 2011-11-14 DIAGNOSIS — K8000 Calculus of gallbladder with acute cholecystitis without obstruction: Secondary | ICD-10-CM | POA: Insufficient documentation

## 2011-11-14 DIAGNOSIS — Z79899 Other long term (current) drug therapy: Secondary | ICD-10-CM | POA: Insufficient documentation

## 2011-11-14 HISTORY — PX: CHOLECYSTECTOMY: SHX55

## 2011-11-14 LAB — CREATININE, SERUM: Creatinine, Ser: 0.84 mg/dL (ref 0.50–1.10)

## 2011-11-14 LAB — DIFFERENTIAL
Lymphocytes Relative: 7 % — ABNORMAL LOW (ref 12–46)
Lymphs Abs: 0.7 10*3/uL (ref 0.7–4.0)
Monocytes Absolute: 0.1 10*3/uL (ref 0.1–1.0)
Monocytes Relative: 1 % — ABNORMAL LOW (ref 3–12)
Neutro Abs: 10.3 10*3/uL — ABNORMAL HIGH (ref 1.7–7.7)

## 2011-11-14 LAB — CBC
Hemoglobin: 12.4 g/dL (ref 12.0–15.0)
MCH: 28.5 pg (ref 26.0–34.0)
MCHC: 33.1 g/dL (ref 30.0–36.0)
MCV: 86.2 fL (ref 78.0–100.0)

## 2011-11-14 SURGERY — LAPAROSCOPIC CHOLECYSTECTOMY WITH INTRAOPERATIVE CHOLANGIOGRAM
Anesthesia: General | Site: Abdomen | Wound class: Contaminated

## 2011-11-14 MED ORDER — FENTANYL CITRATE 0.05 MG/ML IJ SOLN
INTRAMUSCULAR | Status: DC | PRN
  Administered 2011-11-14: 100 ug via INTRAVENOUS

## 2011-11-14 MED ORDER — LIDOCAINE HCL (CARDIAC) 20 MG/ML IV SOLN
INTRAVENOUS | Status: DC | PRN
  Administered 2011-11-14: 75 mg via INTRAVENOUS

## 2011-11-14 MED ORDER — GLYCOPYRROLATE 0.2 MG/ML IJ SOLN
INTRAMUSCULAR | Status: DC | PRN
  Administered 2011-11-14: .3 mg via INTRAVENOUS

## 2011-11-14 MED ORDER — FENTANYL CITRATE 0.05 MG/ML IJ SOLN
25.0000 ug | INTRAMUSCULAR | Status: DC | PRN
  Administered 2011-11-14 (×3): 50 ug via INTRAVENOUS

## 2011-11-14 MED ORDER — HYDROMORPHONE HCL PF 1 MG/ML IJ SOLN
0.2500 mg | INTRAMUSCULAR | Status: DC | PRN
  Administered 2011-11-14 (×3): 0.5 mg via INTRAVENOUS

## 2011-11-14 MED ORDER — MENTHOL 3 MG MT LOZG
1.0000 | LOZENGE | OROMUCOSAL | Status: DC | PRN
  Filled 2011-11-14: qty 9

## 2011-11-14 MED ORDER — DEXTROSE 5 % IV SOLN: 2.0000 g | INTRAVENOUS | Status: DC

## 2011-11-14 MED ORDER — ENOXAPARIN SODIUM 40 MG/0.4ML ~~LOC~~ SOLN
40.0000 mg | SUBCUTANEOUS | Status: DC
  Filled 2011-11-14 (×2): qty 0.4

## 2011-11-14 MED ORDER — BUPIVACAINE-EPINEPHRINE 0.25% -1:200000 IJ SOLN
INTRAMUSCULAR | Status: DC | PRN
  Administered 2011-11-14: 20 mL

## 2011-11-14 MED ORDER — CISATRACURIUM BESYLATE 2 MG/ML IV SOLN
INTRAVENOUS | Status: DC | PRN
  Administered 2011-11-14: 6 mg via INTRAVENOUS

## 2011-11-14 MED ORDER — DEXAMETHASONE SODIUM PHOSPHATE 10 MG/ML IJ SOLN
INTRAMUSCULAR | Status: DC | PRN
  Administered 2011-11-14: 10 mg via INTRAVENOUS

## 2011-11-14 MED ORDER — ACETAMINOPHEN 325 MG PO TABS
650.0000 mg | ORAL_TABLET | ORAL | Status: DC | PRN
  Filled 2011-11-14: qty 2

## 2011-11-14 MED ORDER — PROMETHAZINE HCL 25 MG/ML IJ SOLN: 6.2500 mg | INTRAMUSCULAR | Status: DC | PRN

## 2011-11-14 MED ORDER — MIDAZOLAM HCL 5 MG/5ML IJ SOLN
INTRAMUSCULAR | Status: DC | PRN
  Administered 2011-11-14 (×2): 1 mg via INTRAVENOUS

## 2011-11-14 MED ORDER — LACTATED RINGERS IV SOLN: INTRAVENOUS | Status: DC

## 2011-11-14 MED ORDER — LACTATED RINGERS IV SOLN
INTRAVENOUS | Status: DC
  Administered 2011-11-14: 1000 mL via INTRAVENOUS

## 2011-11-14 MED ORDER — SUCCINYLCHOLINE CHLORIDE 20 MG/ML IJ SOLN
INTRAMUSCULAR | Status: DC | PRN
  Administered 2011-11-14: 100 mg via INTRAVENOUS

## 2011-11-14 MED ORDER — ALUM & MAG HYDROXIDE-SIMETH 200-200-25 MG PO CHEW: 2.0000 | CHEWABLE_TABLET | Freq: Four times a day (QID) | ORAL | Status: DC | PRN

## 2011-11-14 MED ORDER — HYDROMORPHONE HCL PF 1 MG/ML IJ SOLN: 1.0000 mg | INTRAMUSCULAR | Status: DC | PRN

## 2011-11-14 MED ORDER — HYDROCODONE-ACETAMINOPHEN 7.5-500 MG/15ML PO SOLN
15.0000 mL | ORAL | Status: DC | PRN
  Administered 2011-11-14 – 2011-11-15 (×4): 15 mL via ORAL
  Filled 2011-11-14 (×5): qty 15

## 2011-11-14 MED ORDER — IOHEXOL 300 MG/ML  SOLN
INTRAMUSCULAR | Status: DC | PRN
  Administered 2011-11-14: 8 mL via INTRAVENOUS

## 2011-11-14 MED ORDER — EPHEDRINE SULFATE 50 MG/ML IJ SOLN
INTRAMUSCULAR | Status: DC | PRN
  Administered 2011-11-14: 10 mg via INTRAVENOUS

## 2011-11-14 MED ORDER — PROPOFOL 10 MG/ML IV EMUL
INTRAVENOUS | Status: DC | PRN
  Administered 2011-11-14: 25 mg via INTRAVENOUS
  Administered 2011-11-14: 200 mg via INTRAVENOUS
  Administered 2011-11-14: 50 mg via INTRAVENOUS

## 2011-11-14 MED ORDER — LACTATED RINGERS IR SOLN
Status: DC | PRN
  Administered 2011-11-14: 1000 mL

## 2011-11-14 MED ORDER — FENTANYL CITRATE 0.05 MG/ML IJ SOLN
INTRAMUSCULAR | Status: DC | PRN
  Administered 2011-11-14: 50 ug via INTRAVENOUS
  Administered 2011-11-14: 100 ug via INTRAVENOUS
  Administered 2011-11-14 (×2): 50 ug via INTRAVENOUS
  Administered 2011-11-14: 100 ug via INTRAVENOUS

## 2011-11-14 MED ORDER — SODIUM CHLORIDE 0.9 % IV SOLN
1.0000 g | INTRAVENOUS | Status: DC | PRN
  Administered 2011-11-14: 2 g via INTRAVENOUS

## 2011-11-14 MED ORDER — PHENOL 1.4 % MT LIQD
1.0000 | OROMUCOSAL | Status: DC | PRN
  Filled 2011-11-14: qty 177

## 2011-11-14 MED ORDER — ATROPINE SULFATE 0.4 MG/ML IJ SOLN
INTRAMUSCULAR | Status: DC | PRN
  Administered 2011-11-14: .4 mg via INTRAVENOUS

## 2011-11-14 MED ORDER — KCL IN DEXTROSE-NACL 10-5-0.45 MEQ/L-%-% IV SOLN
INTRAVENOUS | Status: DC
  Administered 2011-11-14 (×2): via INTRAVENOUS
  Filled 2011-11-14 (×4): qty 1000

## 2011-11-14 MED ORDER — ONDANSETRON HCL 4 MG/2ML IJ SOLN
INTRAMUSCULAR | Status: DC | PRN
  Administered 2011-11-14 (×4): 1 mg via INTRAVENOUS

## 2011-11-14 MED ORDER — LACTATED RINGERS IV SOLN
INTRAVENOUS | Status: DC | PRN
  Administered 2011-11-14 (×3): via INTRAVENOUS

## 2011-11-14 MED ORDER — SODIUM CHLORIDE 0.9 % IR SOLN
Status: DC | PRN
  Administered 2011-11-14: 4 mL

## 2011-11-14 SURGICAL SUPPLY — 35 items
APPLIER CLIP ROT 10 11.4 M/L (STAPLE) ×4
CANISTER SUCTION 2500CC (MISCELLANEOUS) ×2 IMPLANT
CHLORAPREP W/TINT 26ML (MISCELLANEOUS) ×2 IMPLANT
CLIP APPLIE ROT 10 11.4 M/L (STAPLE) ×2 IMPLANT
CLOTH BEACON ORANGE TIMEOUT ST (SAFETY) ×2 IMPLANT
COVER MAYO STAND STRL (DRAPES) ×2 IMPLANT
DECANTER SPIKE VIAL GLASS SM (MISCELLANEOUS) ×2 IMPLANT
DERMABOND ADVANCED (GAUZE/BANDAGES/DRESSINGS) ×1
DERMABOND ADVANCED .7 DNX12 (GAUZE/BANDAGES/DRESSINGS) ×1 IMPLANT
DRAPE C-ARM 42X72 X-RAY (DRAPES) ×2 IMPLANT
DRAPE LAPAROSCOPIC ABDOMINAL (DRAPES) ×2 IMPLANT
DRAPE WARM FLUID 44X44 (DRAPE) ×2 IMPLANT
ELECT REM PT RETURN 9FT ADLT (ELECTROSURGICAL) ×2
ELECTRODE REM PT RTRN 9FT ADLT (ELECTROSURGICAL) ×1 IMPLANT
FILTER SMOKE EVAC LAPAROSHD (FILTER) ×2 IMPLANT
GLOVE BIOGEL PI IND STRL 7.0 (GLOVE) ×2 IMPLANT
GLOVE BIOGEL PI INDICATOR 7.0 (GLOVE) ×2
GLOVE INDICATOR 8.0 STRL GRN (GLOVE) ×4 IMPLANT
GLOVE SS BIOGEL STRL SZ 8 (GLOVE) ×1 IMPLANT
GLOVE SUPERSENSE BIOGEL SZ 8 (GLOVE) ×1
GOWN STRL NON-REIN LRG LVL3 (GOWN DISPOSABLE) ×4 IMPLANT
GOWN STRL REIN XL XLG (GOWN DISPOSABLE) ×4 IMPLANT
HEMOSTAT SURGICEL 4X8 (HEMOSTASIS) ×2 IMPLANT
KIT BASIN OR (CUSTOM PROCEDURE TRAY) ×2 IMPLANT
POUCH SPECIMEN RETRIEVAL 10MM (ENDOMECHANICALS) ×2 IMPLANT
SCISSORS LAP 5X35 DISP (ENDOMECHANICALS) IMPLANT
SET CHOLANGIOGRAPH MIX (MISCELLANEOUS) ×4 IMPLANT
SET IRRIG TUBING LAPAROSCOPIC (IRRIGATION / IRRIGATOR) ×2 IMPLANT
SUT MNCRL AB 4-0 PS2 18 (SUTURE) ×2 IMPLANT
TOWEL OR 17X26 10 PK STRL BLUE (TOWEL DISPOSABLE) ×2 IMPLANT
TRAY LAP CHOLE (CUSTOM PROCEDURE TRAY) ×2 IMPLANT
TROCAR BLADELESS OPT 5 75 (ENDOMECHANICALS) ×4 IMPLANT
TROCAR XCEL BLUNT TIP 100MML (ENDOMECHANICALS) ×2 IMPLANT
TROCAR XCEL NON-BLD 11X100MML (ENDOMECHANICALS) ×2 IMPLANT
TUBING INSUFFLATION 10FT LAP (TUBING) ×2 IMPLANT

## 2011-11-14 NOTE — Preoperative (Signed)
Beta Blockers   Reason not to administer Beta Blockers:Not Applicable 

## 2011-11-14 NOTE — Anesthesia Preprocedure Evaluation (Signed)
Anesthesia Evaluation  Patient identified by MRN, date of birth, ID band Patient awake    Reviewed: Allergy & Precautions, H&P , NPO status , Patient's Chart, lab work & pertinent test results  Airway Mallampati: II TM Distance: >3 FB Neck ROM: Full    Dental  (+) Teeth Intact and Dental Advisory Given   Pulmonary neg pulmonary ROS,  clear to auscultation  Pulmonary exam normal       Cardiovascular neg cardio ROS Regular Normal    Neuro/Psych  Headaches, PSYCHIATRIC DISORDERS Negative Neurological ROS  Negative Psych ROS   GI/Hepatic negative GI ROS, Neg liver ROS, GERD-  Poorly Controlled,  Endo/Other  Negative Endocrine ROS  Renal/GU negative Renal ROS  Genitourinary negative   Musculoskeletal negative musculoskeletal ROS (+)   Abdominal   Peds negative pediatric ROS (+)  Hematology negative hematology ROS (+)   Anesthesia Other Findings   Reproductive/Obstetrics (+) Breast feeding  (Patient informed to pump and discard breast milk for 24 hours post op)                           Anesthesia Physical Anesthesia Plan  ASA: II  Anesthesia Plan: General   Post-op Pain Management:    Induction: Intravenous  Airway Management Planned: Oral ETT  Additional Equipment:   Intra-op Plan:   Post-operative Plan: Extubation in OR  Informed Consent: I have reviewed the patients History and Physical, chart, labs and discussed the procedure including the risks, benefits and alternatives for the proposed anesthesia with the patient or authorized representative who has indicated his/her understanding and acceptance.   Dental advisory given  Plan Discussed with: CRNA  Anesthesia Plan Comments:         Anesthesia Quick Evaluation

## 2011-11-14 NOTE — Progress Notes (Signed)
Pt has hx of migraine H/A and complains that head hurts but is not yet "to the point of a migraine"

## 2011-11-14 NOTE — Anesthesia Postprocedure Evaluation (Signed)
  Anesthesia Post-op Note  Patient: Gloria Rodriguez  Procedure(s) Performed:  LAPAROSCOPIC CHOLECYSTECTOMY WITH INTRAOPERATIVE CHOLANGIOGRAM - Laparoscopic Cholecystectomy with cholangiogram, c-arm  Patient Location: PACU  Anesthesia Type: General  Level of Consciousness: awake, alert  and oriented  Airway and Oxygen Therapy: Patient Spontanous Breathing  Post-op Pain: moderate  Post-op Assessment: Post-op Vital signs reviewed  Post-op Vital Signs: stable  Complications: No apparent anesthesia complications

## 2011-11-14 NOTE — Op Note (Signed)
Laparoscopic Cholecystectomy with IOC Procedure Note  Indications: This patient presents with symptomatic gallbladder disease and will undergo laparoscopic cholecystectomy.  Pre-operative Diagnosis: Calculus of gallbladder with acute cholecystitis, without mention of obstruction  Post-operative Diagnosis: Same  Surgeon: Mozell Hardacre A.   Assistants: OR staff  Anesthesia: General endotracheal anesthesia and Local anesthesia 0.25.% bupivacaine, with epinephrine  ASA Class: 1  Procedure Details  The patient was seen again in the Holding Room. The risks, benefits, complications, treatment options, and expected outcomes were discussed with the patient. The possibilities of reaction to medication, pulmonary aspiration, perforation of viscus, bleeding, recurrent infection, finding a normal gallbladder, the need for additional procedures, failure to diagnose a condition, the possible need to convert to an open procedure, and creating a complication requiring transfusion or operation were discussed with the patient. The patient and/or family concurred with the proposed plan, giving informed consent. The site of surgery properly noted/marked. The patient was taken to Operating Room, identified as Gloria Rodriguez and the procedure verified as Laparoscopic Cholecystectomy with Intraoperative Cholangiograms. A Time Out was held and the above information confirmed.  Prior to the induction of general anesthesia, antibiotic prophylaxis was administered. General endotracheal anesthesia was then administered and tolerated well. After the induction, the abdomen was prepped in the usual sterile fashion. The patient was positioned in the supine position with the left arm comfortably tucked, along with some reverse Trendelenburg.  Local anesthetic agent was injected into the skin near the umbilicus and an incision made. The midline fascia was incised and the Hasson technique was used to introduce a 10 mm port under  direct vision. It was secured with a figure of eight Vicryl suture placed in the usual fashion. Pneumoperitoneum was then created with CO2 and tolerated well without any adverse changes in the patient's vital signs. Additional trocars were introduced under direct vision. All skin incisions were infiltrated with a local anesthetic agent before making the incision and placing the trocars.   The gallbladder was identified, the fundus grasped and retracted cephalad. Adhesions were lysed bluntly and with the electrocautery where indicated, taking care not to injure any adjacent organs or viscus. The infundibulum was grasped and retracted laterally, exposing the peritoneum overlying the triangle of Calot. This was then divided and exposed in a blunt fashion. The cystic duct was clearly identified and bluntly dissected circumferentially. The junctions of the gallbladder, cystic duct and common bile duct were clearly identified prior to the division of any linear structure and photo documented.   An incision was made in the cystic duct and the cholangiogram catheter introduced. The catheter was secured using an endoclip. The study showed no stones and good visualization of the distal and proximal biliary tree. The catheter was then removed.   The cystic duct was then  ligated with surgical clips on the patient side and clipped on the gallbladder side and divided. The cystic artery was identified, dissected free, ligated with clips and divided as well. There was a posterior branch of the cystic artery that was clipped.  The gallbladder was dissected from the liver bed in retrograde fashion with the electrocautery. The gallbladder was removed. The liver bed was irrigated and inspected. Hemostasis was achieved with the electrocautery. Copious irrigation was utilized and was repeatedly aspirated until clear all particulate matter.  Pneumoperitoneum was completely reduced after viewing removal of the trocars under  direct vision. The wound was thoroughly irrigated and the fascia was then closed with a figure of eight suture; the skin was  then closed with  4 0 monocryl  and a sterile dressing was applied.  Instrument, sponge, and needle counts were correct at closure and at the conclusion of the case.   Findings: Cholecystitis with Cholelithiasis  Estimated Blood Loss: less than 50 mL         Drains: none         Total IV Fluids:  1500 mL         Specimens: Gallbladder           Complications: None; patient tolerated the procedure well.         Disposition: PACU - hemodynamically stable.         Condition: stable

## 2011-11-14 NOTE — Transfer of Care (Signed)
Immediate Anesthesia Transfer of Care Note  Patient: Gloria Rodriguez  Procedure(s) Performed:  LAPAROSCOPIC CHOLECYSTECTOMY WITH INTRAOPERATIVE CHOLANGIOGRAM - Laparoscopic Cholecystectomy with cholangiogram, c-arm  Patient Location: PACU  Anesthesia Type: General  Level of Consciousness: awake, alert , oriented, patient cooperative and responds to stimulation  Airway & Oxygen Therapy: Patient Spontanous Breathing and Patient connected to face mask oxygen  Post-op Assessment: Report given to PACU RN, Post -op Vital signs reviewed and stable and Patient moving all extremities X 4  Post vital signs: Reviewed and stable  Complications: unexpected post-op hospitalization

## 2011-11-14 NOTE — Interval H&P Note (Signed)
History and Physical Interval Note:  11/14/2011 7:33 AM  [name]  has presented today for surgery, with the diagnosis of cholelithiasis  The various methods of treatment have been discussed with the patient and family. After consideration of risks, benefits and other options for treatment, the patient has consented to  Procedure(s): LAPAROSCOPIC CHOLECYSTECTOMY WITH INTRAOPERATIVE CHOLANGIOGRAM as a surgical intervention .  The patients' history has been reviewed, patient examined, no change in status, stable for surgery.  I have reviewed the patients' chart and labs.  Questions were answered to the patient's satisfaction.     Gloria Rodriguez A.

## 2011-11-14 NOTE — H&P (View-Only) (Signed)
Chief Complaint  Patient presents with  . Other    new pt- eval gb     HPI Gloria Rodriguez is a 35 y.o. female.   HPI The patient presents with a chief complaint of abdominal pain and back pain. She is about 3 months postpartum. She seen the emergency room and found to have gallstones by ultrasound. She had mild elevation of her lipase. She had significant elevation of her SGOT and SGPT. She felt better after seeing the emergency room was sent home with outpatient followup arranged. She complained of epigastric pain. It lasted 8 hours. It radiated to her back. When she received pain medicine emergency room her symptoms were relieved. Hepatitis panel was negative. She is feeling better but still has some residual epigastric discomfort and back pain. It is mild in nature. It is sharp. She also is a low bloating and gas. She has intolerance to codeine which gives her migraines. She has intolerance to oxycodone hydrocodone again these give her migraine headaches after prolonged use which he thinks is from the tablet form of these medications. She has never had any anaphylaxis to oxycodone or hydrocodone. She was given DILAUDID after her C-section. She took this for about a week to develop migraines as well. She's been breast-feeding but does that at times intermittently. Past Medical History  Diagnosis Date  . Anxiety   . Depression   . Headache   . HSV infection     on Valtrex suppression  . Postpartum care and examination of lactating mother 07/13/2011  . GERD (gastroesophageal reflux disease)     pregnancy related    Past Surgical History  Procedure Date  . Cesarean section   . Cesarean section 07/11/2011    Procedure: CESAREAN SECTION;  Surgeon: Genia Del;  Location: [facility];  Service: Gynecology;  Laterality: N/A;  Repeat    Family History  Problem Relation Age of Onset  . Heart disease Maternal Grandmother     Social History History  Substance Use Topics  . Smoking status:  Never Smoker   . Smokeless tobacco: Not on file  . Alcohol Use: Yes    Allergies  Allergen Reactions  . Codeine Other (See Comments)    migraines  . Hydrocodone Other (See Comments)    migraines  . Oxycodone Other (See Comments)    migraines  . Ultram (Tramadol Hcl) Other (See Comments)    Migraines/nightmares  . Vicodin (Hydrocodone-Acetaminophen) Other (See Comments)    migraines    Current Outpatient Prescriptions  Medication Sig Dispense Refill  . HYDROcodone-acetaminophen (LORTAB) 7.5-500 MG/15ML solution Take 15 mLs by mouth every 6 (six) hours as needed for pain.  120 mL  1  . Prenatal Vit-Fe Psac Cmplx-FA (PRENATAL MULTIVITAMIN) 60-1 MG tablet Take 1 tablet by mouth daily with breakfast.        . sertraline (ZOLOFT) 100 MG tablet Take 100 mg by mouth daily.        . valACYclovir (VALTREX) 500 MG tablet Take 1 tablet (500 mg total) by mouth 2 (two) times daily.  30 tablet  1    Review of Systems Review of Systems  Constitutional: Positive for fatigue. Negative for activity change and appetite change.  HENT: Negative.   Eyes: Negative.   Respiratory: Negative.   Cardiovascular: Negative.   Gastrointestinal: Positive for nausea, vomiting, abdominal pain and blood in stool. Negative for abdominal distention.  Genitourinary: Negative.   Musculoskeletal: Negative.   Neurological: Negative.   Hematological: Negative.  Psychiatric/Behavioral: Negative.     Blood pressure 100/74, pulse 68, temperature 97.6 F (36.4 C), temperature source Temporal, resp. rate 12, height 5' 5.5" (1.664 m), weight 201 lb 12.8 oz (91.536 kg).  Physical Exam Physical Exam  Constitutional: She appears well-developed and well-nourished.  HENT:  Head: Normocephalic and atraumatic.  Eyes: EOM are normal. Pupils are equal, round, and reactive to light.  Neck: Normal range of motion. Neck supple.  Cardiovascular: Normal rate and regular rhythm.   Pulmonary/Chest: Effort normal and breath  sounds normal.  Abdominal: Soft. Bowel sounds are normal.       Minimal right upper quadrant tenderness. Negative Murphy sign. No mass.  Skin: Skin is warm and dry.  Psychiatric: She has a normal mood and affect. Her behavior is normal. Judgment and thought content normal.    Data Reviewed  Ultrasound from 1029 2012 shows gallstones. No gallbladder wall thickening. Common bile duct non-dilated. SGOT SGPT greater than 600. Lipase greater than 100. Total bilirubin normal.Hepatitis profile negative Assessment    Chronic cholecystitis with possible mild pancreatitis. Elevated LFTS    Plan    I reviewed her lab work and noted to the emergency room. She may have passed some gallstones. His back out for her mild elevation of her lipase and transaminases. She is not jaundiced today and feels better. I do feel she will benefit from laparoscopic cholecystectomy with cholangiogram. Medical alternative also given the patient for treatment of gallstones. The potential benefit of surgery this case is over 90%. She understands the above like proceed.The procedure has been discussed with the patient. Operative and non operative treatments have been discussed. Risks of surgery include bleeding, iInfection,  Common bile duct injury,  Injury to the stomach,liver, colon,small intestine, abdominal wall,  Diaphragm,  Major blood vessels,  And the need for an open procedure.  Other risks include worsening of medical problems, death,  DVT and pulmonary embolism, and cardiovascular events.   Medical options have also been discussed. The patient has been informed of long term expectations of surgery and non surgical options,  The patient agrees to proceed.         Gloria Rodriguez A. 10/23/2011, 3:58 PM

## 2011-11-15 ENCOUNTER — Encounter (HOSPITAL_COMMUNITY): Payer: Self-pay | Admitting: Surgery

## 2011-11-15 LAB — COMPREHENSIVE METABOLIC PANEL
Alkaline Phosphatase: 75 U/L (ref 39–117)
BUN: 8 mg/dL (ref 6–23)
CO2: 26 mEq/L (ref 19–32)
Chloride: 103 mEq/L (ref 96–112)
Creatinine, Ser: 0.71 mg/dL (ref 0.50–1.10)
GFR calc non Af Amer: >90 mL/min (ref >90)
Glucose, Bld: 139 mg/dL — ABNORMAL HIGH (ref 70–99)
Total Bilirubin: 0.3 mg/dL (ref 0.3–1.2)

## 2011-11-15 LAB — CBC
Platelets: 222 10*3/uL (ref 150–400)
RBC: 4.21 MIL/uL (ref 3.87–5.11)
RDW: 14.4 % (ref 11.5–15.5)
WBC: 12.9 10*3/uL — ABNORMAL HIGH (ref 4.0–10.5)

## 2011-11-15 MED ORDER — NAPROXEN 250 MG PO TABS
250.0000 mg | ORAL_TABLET | Freq: Three times a day (TID) | ORAL | Status: DC | PRN
  Administered 2011-11-15: 250 mg via ORAL
  Filled 2011-11-15 (×3): qty 1

## 2011-11-15 MED ORDER — ALUM & MAG HYDROXIDE-SIMETH 200-200-20 MG/5ML PO SUSP
15.0000 mL | Freq: Four times a day (QID) | ORAL | Status: DC | PRN
  Administered 2011-11-15 (×2): 15 mL via ORAL
  Filled 2011-11-15 (×2): qty 30

## 2011-11-15 MED ORDER — RANITIDINE HCL 150 MG PO CAPS: 150.0000 mg | ORAL_CAPSULE | Freq: Two times a day (BID) | ORAL | Status: DC

## 2011-11-15 NOTE — Progress Notes (Signed)
1 Day Post-Op  Subjective: Sore throat blood in sputum  IC bothersome  Objective: Vital signs in last 24 hours: Temp:  [96.8 F (36 C)-98.8 F (37.1 C)] 97.9 F (36.6 C) (12/05 0600) Pulse Rate:  [60-129] 61  (12/05 0600) Resp:  [12-21] 16  (12/05 0600) BP: (102-145)/(59-93) 106/73 mmHg (12/05 0600) SpO2:  [89 %-100 %] 97 % (12/05 0600) Weight:  [201 lb (91.173 kg)] 201 lb (91.173 kg) (12/04 1638) Last BM Date: 11/13/11  Intake/Output from previous day: 12/04 0701 - 12/05 0700 In: 6308.9 [P.O.:1000; I.V.:5308.9] Out: 2650 [Urine:2650] Intake/Output this shift:    GI: wounds clean dry intact soft min soreness lungs clear  Lab Results:   Basename 11/15/11 0432 11/14/11 1731  WBC 12.9* 11.2*  HGB 11.8* 12.4  HCT 36.4 37.5  PLT 222 212   BMET  Basename 11/15/11 0432 11/14/11 1731  NA 138 --  K 4.1 --  CL 103 --  CO2 26 --  GLUCOSE 139* --  BUN 8 --  CREATININE 0.71 0.84  CALCIUM 9.4 --   PT/INR No results found for this basename: LABPROT:2,INR:2 in the last 72 hours ABG No results found for this basename: PHART:2,PCO2:2,PO2:2,HCO3:2 in the last 72 hours  Studies/Results: Dg Cholangiogram Operative  11/14/2011  *RADIOLOGY REPORT*  Clinical data:  Cholelithiasis.  Laparoscopic cholecystectomy.  INTRAOPERATIVE CHOLANGIOGRAM 11/14/2011:  Comparison: Abdominal ultrasound 10/09/2011.  Findings:  A series of cholangiographic images from the C-arm fluoroscopic device were submitted for interpretation post- operatively.  The cannula is present in the cystic duct remnant with excellent opacification of the common bile duct, common hepatic duct, and proximal intrahepatic ducts.  No filling defects to suggest retained bile duct stones.  Excellent antegrade flow into the duodenum. Minimal extravasation at the injection site.  Please correlate with findings at real time fluoroscopy.  IMPRESSION: Normal intraoperative cholangiogram with minimal extravasation at the injection site.   These images were submitted for radiologic interpretation only. Please see the procedural report for the amount of contrast and the fluoroscopy time utilized.  Original Report Authenticated By: Arnell Sieving, M.D.    Anti-infectives: Anti-infectives     Start     Dose/Rate Route Frequency Ordered Stop   11/14/11 0630   cefOXitin (MEFOXIN) 2 g in dextrose 5 % 50 mL IVPB  Status:  Discontinued        2 g 100 mL/hr over 30 Minutes Intravenous 60 min pre-op 11/14/11 0619 11/14/11 1627          Assessment/Plan: s/p Procedure(s): LAPAROSCOPIC CHOLECYSTECTOMY WITH INTRAOPERATIVE CHOLANGIOGRAM Discharge  LOS: 1 day    [name] A. 11/15/2011

## 2011-11-15 NOTE — Discharge Summary (Signed)
Physician Discharge Summary  Patient ID: Gloria Rodriguez MRN: [id number] DOB/AGE: January 07, 1976 35 y.o.  Admit date: 11/14/2011 Discharge date: 11/15/2011  Admission Diagnoses: cholelithiasis  Discharge Diagnoses: same Active Problems:  * No active hospital problems. *    Discharged Condition: good  Hospital Course: Unremarkable.  Consults: none  Significant Diagnostic Studies: Cholangiogram normal  Treatments: surgery: Lap chole with IOC  Discharge Exam: Blood pressure 106/73, pulse 61, temperature 97.9 F (36.6 C), temperature source Oral, resp. rate 16, height 5\' 5"  (1.651 m), weight 201 lb (91.173 kg), SpO2 97.00%, currently breastfeeding. GI: wounds clean dry intact  Disposition: Home or Self Care  Discharge Orders    Future Orders Please Complete By Expires   Diet general      Increase activity slowly      Driving Restrictions      Comments:   Resume in 5 days.   Lifting restrictions      Comments:   No lifting greater than 20 lbs for 5 days.   Discharge instructions      Comments:   Contact OB or pediatrician about the use of pain medicine and breast feeding.     Current Discharge Medication List    START taking these medications   Details  ranitidine (ZANTAC) 150 MG capsule Take 1 capsule (150 mg total) by mouth 2 (two) times daily. Qty: 30 capsule, Refills: 1      CONTINUE these medications which have NOT CHANGED   Details  sertraline (ZOLOFT) 100 MG tablet Take 100 mg by mouth every morning.     valACYclovir (VALTREX) 500 MG tablet Take 500 mg by mouth 2 (two) times daily as needed. OUTBREAK      STOP taking these medications     Prenatal Vit-Fe Psac Cmplx-FA (PRENATAL MULTIVITAMIN) 60-1 MG tablet      promethazine (PHENERGAN) 25 MG tablet          Signed: Kalum Minner A. 11/15/2011, 8:26 AM

## 2011-11-15 NOTE — Progress Notes (Signed)
Patient discharge and education performed.  Questions answered and information clarified for patient and patient's father.  Patient taken out via W/C to private vehicle.  Patient stated she is not driver

## 2011-11-16 ENCOUNTER — Telehealth (INDEPENDENT_AMBULATORY_CARE_PROVIDER_SITE_OTHER): Payer: Self-pay

## 2011-11-16 NOTE — Telephone Encounter (Signed)
Patient called and said her jaw, neck, back and torso area muscles were tight and it was hard to move. Also it would start to spasm if she moved to fast. I paged Dr Luisa Hart and he said he wasn't sure that had anything to do with her surgery and to call her PCP to see if they could see her. She said she is also taking loratab elixir and just called in a refill but was concerned she would run out in another day because she was having to increase her amount from 15ml to 20ml every 4 hrs due to the pain. I told her I could not call in a refill when she just called one in. She is to call us when she is running low on her refill and would access calling in more refills.

## 2011-11-17 ENCOUNTER — Telehealth (INDEPENDENT_AMBULATORY_CARE_PROVIDER_SITE_OTHER): Payer: Self-pay

## 2011-11-17 NOTE — Telephone Encounter (Signed)
Called pt to notify her that Lortab elixir 7.5/500 to take 15ml q6hrs w/1add.RF per Dr Luisa Hart phoned to [email or web address]

## 2011-11-17 NOTE — Telephone Encounter (Signed)
Pt is calling in requesting a refill on her Lortab elixor she is taking 20ml q4hrs so she said she is going thru 1 bottle a day b/c her last RF was 11-16-11. The pt is requesting enough to last her thru the weekend. Please call [facility] to refill the Rx./ [facility]

## 2011-11-17 NOTE — Telephone Encounter (Signed)
Pt called again to request her medication refill.  I explained to her that the doctor had the request and would follow up as soon as his clinic was finished.  She asked if she could call us back, and I told her the doctor or his nurse would call her back.

## 2011-11-21 ENCOUNTER — Other Ambulatory Visit (INDEPENDENT_AMBULATORY_CARE_PROVIDER_SITE_OTHER): Payer: Self-pay | Admitting: Surgery

## 2011-11-21 ENCOUNTER — Telehealth (INDEPENDENT_AMBULATORY_CARE_PROVIDER_SITE_OTHER): Payer: Self-pay

## 2011-11-21 MED ORDER — CHOLESTYRAMINE 4 G PO PACK: 1.0000 | PACK | Freq: Three times a day (TID) | ORAL | Status: DC

## 2011-11-21 NOTE — Telephone Encounter (Signed)
Will call in cholestyramine for diarrhea.

## 2011-11-21 NOTE — Telephone Encounter (Signed)
THIS IS IN RESPONSE TO PT CALL IN EARLIER TODAY WITH SYMPTOMS OF DIARRHEA AND CRAMPING AFTER MEALS. DR. Luisa Hart REVIEWED MESSAGE AND ORDERED QUESTRAN ELECTRONICALLY TO PHARMACY. PT ADVISED OF ACTION.GY

## 2011-11-21 NOTE — Telephone Encounter (Signed)
Pt called in stating she is having nausea,cold sweats,diarrhea, and dull ache in her back. The pt has not taken her temp. B/c she doesn't feel she is running a fever. The pt had a lap chole 11-14-11 by Dr Luisa Hart. The pt stopped her pain med. Yesterday. The pt is not really eating all that much b/c every time she eats she feels she gets abdominal cramps along with diarrhea. Please call pt to advise her what she needs to do./ [facility]

## 2011-12-01 ENCOUNTER — Encounter (INDEPENDENT_AMBULATORY_CARE_PROVIDER_SITE_OTHER): Payer: Managed Care, Other (non HMO) | Admitting: Surgery

## 2011-12-02 ENCOUNTER — Ambulatory Visit (INDEPENDENT_AMBULATORY_CARE_PROVIDER_SITE_OTHER): Payer: Managed Care, Other (non HMO)

## 2011-12-02 DIAGNOSIS — B0229 Other postherpetic nervous system involvement: Secondary | ICD-10-CM

## 2011-12-18 ENCOUNTER — Encounter (INDEPENDENT_AMBULATORY_CARE_PROVIDER_SITE_OTHER): Payer: Self-pay | Admitting: Surgery

## 2011-12-18 ENCOUNTER — Ambulatory Visit (INDEPENDENT_AMBULATORY_CARE_PROVIDER_SITE_OTHER): Payer: Managed Care, Other (non HMO) | Admitting: Surgery

## 2011-12-18 VITALS — BP 124/86 | HR 84 | Temp 97.6°F | Resp 18 | Ht 65.5 in | Wt 203.8 lb

## 2011-12-18 DIAGNOSIS — Z9889 Other specified postprocedural states: Secondary | ICD-10-CM

## 2011-12-18 MED ORDER — RANITIDINE HCL 150 MG PO CAPS: 150.0000 mg | ORAL_CAPSULE | Freq: Two times a day (BID) | ORAL | Status: DC

## 2011-12-18 NOTE — Patient Instructions (Signed)
Continue cholestyramine and zantac.  Follow up as needed.

## 2011-12-18 NOTE — Progress Notes (Signed)
Patient returns after laparoscopic cholecystectomy. She has had problems with diarrhea and cholestyramine seems to have helped.  Exam: Port sites clean dry and intact  Impression status post laparoscopic cholecystectomy cholangiogram postoperative diarrhea slowly improving  History of gastroesophageal reflux disease  She will continue cholestyramine but he is using less of it. I will refill her Zantac prescription and have recommended that she find a primary care doctor. Followup as needed.

## 2012-05-12 ENCOUNTER — Other Ambulatory Visit (INDEPENDENT_AMBULATORY_CARE_PROVIDER_SITE_OTHER): Payer: Self-pay | Admitting: Surgery

## 2013-10-15 ENCOUNTER — Ambulatory Visit: Payer: Managed Care, Other (non HMO) | Admitting: Cardiovascular Disease

## 2013-11-18 ENCOUNTER — Ambulatory Visit (HOSPITAL_COMMUNITY)
Admission: RE | Admit: 2013-11-18 | Discharge: 2013-11-18 | Disposition: A | Discharge status: Home / Self Care | Payer: BC Managed Care – PPO | Source: Ambulatory Visit | Attending: Cardiology | Admitting: Cardiology

## 2013-11-18 ENCOUNTER — Encounter (HOSPITAL_COMMUNITY)
Admission: RE | Disposition: A | Discharge status: Home / Self Care | Payer: Self-pay | Source: Ambulatory Visit | Attending: Cardiology

## 2013-11-18 ENCOUNTER — Encounter (HOSPITAL_COMMUNITY): Payer: Self-pay

## 2013-11-18 DIAGNOSIS — F319 Bipolar disorder, unspecified: Secondary | ICD-10-CM | POA: Insufficient documentation

## 2013-11-18 DIAGNOSIS — R0602 Shortness of breath: Secondary | ICD-10-CM | POA: Insufficient documentation

## 2013-11-18 DIAGNOSIS — F909 Attention-deficit hyperactivity disorder, unspecified type: Secondary | ICD-10-CM | POA: Insufficient documentation

## 2013-11-18 DIAGNOSIS — K219 Gastro-esophageal reflux disease without esophagitis: Secondary | ICD-10-CM | POA: Insufficient documentation

## 2013-11-18 DIAGNOSIS — R9389 Abnormal findings on diagnostic imaging of other specified body structures: Secondary | ICD-10-CM | POA: Insufficient documentation

## 2013-11-18 HISTORY — PX: TEE WITHOUT CARDIOVERSION: SHX5443

## 2013-11-18 SURGERY — ECHOCARDIOGRAM, TRANSESOPHAGEAL
Anesthesia: Moderate Sedation

## 2013-11-18 MED ORDER — DIPHENHYDRAMINE HCL 50 MG/ML IJ SOLN
INTRAMUSCULAR | Status: AC
  Filled 2013-11-18: qty 1

## 2013-11-18 MED ORDER — SODIUM CHLORIDE 0.9 % IV SOLN
INTRAVENOUS | Status: DC
  Administered 2013-11-18: 09:00:00 via INTRAVENOUS

## 2013-11-18 MED ORDER — BUTAMBEN-TETRACAINE-BENZOCAINE 2-2-14 % EX AERO
INHALATION_SPRAY | CUTANEOUS | Status: DC | PRN
  Administered 2013-11-18: 2 via TOPICAL

## 2013-11-18 MED ORDER — MIDAZOLAM HCL 10 MG/2ML IJ SOLN
INTRAMUSCULAR | Status: DC | PRN
  Administered 2013-11-18 (×4): 1 mg via INTRAVENOUS
  Administered 2013-11-18 (×2): 2 mg via INTRAVENOUS

## 2013-11-18 MED ORDER — FENTANYL CITRATE 0.05 MG/ML IJ SOLN
INTRAMUSCULAR | Status: DC | PRN
  Administered 2013-11-18 (×2): 25 ug via INTRAVENOUS

## 2013-11-18 MED ORDER — MIDAZOLAM HCL 5 MG/ML IJ SOLN
INTRAMUSCULAR | Status: AC
  Filled 2013-11-18: qty 2

## 2013-11-18 MED ORDER — FENTANYL CITRATE 0.05 MG/ML IJ SOLN
INTRAMUSCULAR | Status: AC
  Filled 2013-11-18: qty 2

## 2013-11-18 NOTE — Interval H&P Note (Signed)
History and Physical Interval Note:  11/18/2013 10:16 AM  Gloria Rodriguez  has presented today for surgery, with the diagnosis of ASD  The various methods of treatment have been discussed with the patient and family. After consideration of risks, benefits and other options for treatment, the patient has consented to  Procedure(s): TRANSESOPHAGEAL ECHOCARDIOGRAM (TEE) (N/A) as a surgical intervention .  The patient's history has been reviewed, patient examined, no change in status, stable for surgery.  I have reviewed the patient's chart and labs.  Questions were answered to the patient's satisfaction.     Pamella Pert

## 2013-11-18 NOTE — Progress Notes (Signed)
Echocardiogram Echocardiogram Transesophageal has been performed.  Gloria Rodriguez 11/18/2013, 11:34 AM

## 2013-11-18 NOTE — H&P (Signed)
  Please see office visit notes for complete details of HPI.  

## 2013-11-18 NOTE — CV Procedure (Signed)
TEE: Under moderate sedation, TEE was performed without complications: LV: Normal. Normal EF. RV: Normal LA: Normal. Left atrial appendage: Normal without thrombus. Normal function. Inter atrial septum is intact without defect. Double contrast study negative for atrial level shunting. No late appearance of bubbles either. RA: Normal  MV: Normal Trace MR. TV: Normal Trace TR AV: Normal. No AI or AS. PV: Normal. Trace PI.  Thoracic and ascending aorta: Normal without significant plaque or atheromatous changes. Normal study

## 2013-11-19 ENCOUNTER — Encounter (HOSPITAL_COMMUNITY): Payer: Self-pay | Admitting: Cardiology

## 2014-01-14 ENCOUNTER — Encounter (INDEPENDENT_AMBULATORY_CARE_PROVIDER_SITE_OTHER): Payer: Self-pay

## 2014-01-14 ENCOUNTER — Encounter: Payer: Self-pay | Admitting: Neurology

## 2014-01-14 ENCOUNTER — Ambulatory Visit (INDEPENDENT_AMBULATORY_CARE_PROVIDER_SITE_OTHER): Payer: BC Managed Care – PPO | Admitting: Neurology

## 2014-01-14 VITALS — BP 124/80 | HR 110 | Temp 99.3°F | Ht 65.0 in | Wt 228.0 lb

## 2014-01-14 DIAGNOSIS — R0609 Other forms of dyspnea: Secondary | ICD-10-CM

## 2014-01-14 DIAGNOSIS — R351 Nocturia: Secondary | ICD-10-CM

## 2014-01-14 DIAGNOSIS — R0989 Other specified symptoms and signs involving the circulatory and respiratory systems: Secondary | ICD-10-CM

## 2014-01-14 DIAGNOSIS — G479 Sleep disorder, unspecified: Secondary | ICD-10-CM

## 2014-01-14 DIAGNOSIS — R0683 Snoring: Secondary | ICD-10-CM

## 2014-01-14 DIAGNOSIS — R635 Abnormal weight gain: Secondary | ICD-10-CM

## 2014-01-14 DIAGNOSIS — R519 Headache, unspecified: Secondary | ICD-10-CM

## 2014-01-14 DIAGNOSIS — R51 Headache: Secondary | ICD-10-CM

## 2014-01-14 DIAGNOSIS — G4761 Periodic limb movement disorder: Secondary | ICD-10-CM

## 2014-01-14 NOTE — Patient Instructions (Addendum)
Based on your symptoms and your exam I believe you are at risk for obstructive sleep apnea or OSA, and I think we should proceed with a sleep study to determine whether you do or do not have OSA and how severe it is. If you have more than mild OSA, I want you to consider treatment with CPAP. Please remember, the risks and ramifications of moderate to severe obstructive sleep apnea or OSA are: Cardiovascular disease, including congestive heart failure, stroke, difficult to control hypertension, arrhythmias, and even type 2 diabetes has been linked to untreated OSA. Sleep apnea causes disruption of sleep and sleep deprivation in most cases, which, in turn, can cause recurrent headaches, problems with memory, mood, concentration, focus, and vigilance. Most people with untreated sleep apnea report excessive daytime sleepiness, which can affect their ability to drive. Please do not drive if you feel sleepy.  I will see you back after your sleep study to go over the test results and where to go from there. We will call you after your sleep study and to set up an appointment at the time.   We will also monitor your PLMD and sleep disruption.

## 2014-01-14 NOTE — Progress Notes (Signed)
Subjective:    Patient ID: Gloria Rodriguez is a 38 y.o. female.  HPI    Star Age, MD, PhD [facility] [address] [place], [place] [address]  Dear Ulice Dash,   I saw your patient, Ruth Slusher, upon your kind request in my neurologic clinic today for initial consultation of her sleep disorder, in particular, concern for obstructive sleep apnea in the context of recurrent HAs and EDS. The patient is unaccompanied today. As you know, Ms. Kimbrough is a very pleasant 38 year old right-handed woman with an underlying medical history of OCD, migraine headaches, allergies, depression, ADHD, bipolar disorder, reflux disease, and interstitial cystitis, who had seen you for complaints of fatigue and shortness of breath. She had a TEE as well as a exercise stress test. She also had a TTE on 10/30/2013, which showed a calculated EF of 57%, small ASD or PFO, otherwise normal findings. TEE on 11/18/2013 showed no evidence of ASD and no right-to-left shunting. She had a exercise stress test on 11/03/2013 which showed poor exercise tolerance but no abnormal findings. She had previously had a sleep study in 2007 which apparently showed periodic limb movements. There was no OSA at the time and she had no deep sleep, she recalls. She grinds her teeth and has been using an OTC bite guard. She also has sleep talking. She sees a Social worker at [facility]. She has been sensitive to medications. 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 She takes Mirapex 1 mg at bedtime and Xanax 0.25 mg prn. She has been having more migraines. She has been seeing Dr. Reuel Boom.   Her typical bedtime is reported to be around 11 PM and usual wake time is around 6:30 AM. Sleep onset typically occurs within a few minutes. She reports feeling marginally rested upon awakening. She wakes up on an average 3 times in the middle of the night and has to go to the bathroom 2 times on a  typical night. She admits to rare morning headaches.  She reports excessive daytime somnolence (EDS) and Her Epworth Sleepiness Score (ESS) is 5/24 today. But this is because she is on Adderall XR 25 mg daily in AM. She has been on it since 2008. She was on Vyvanse before. She estimates and ESS of 13/24 off of medication. She has not fallen asleep while driving, but has had to pull off the road before to avoid dozing off. The patient has not been taking a planned nap.   She has been known to snore for the past few years. Snoring is reportedly mild, and she is not sure if it is associated with choking sounds and witnessed apneas. The patient denies a sense of choking or strangling feeling. There is no report of nighttime reflux, with no nighttime cough experienced. The patient has not noted any RLS symptoms current, but is still is known to kick while asleep or before falling asleep. There is family history of RLS in her father and OSA in her PU.  She is a restless sleeper and in the morning, the bed is quite disheveled.   She denies cataplexy, sleep paralysis, hypnagogic or hypnopompic hallucinations, or sleep attacks. She does not report any vivid dreams, nightmares, dream enactments, or other parasomnias, such as sleep walking, but she has sleep talking. The patient has not had a sleep study or a home sleep test in 2007 or 08. She has gained a significant amount of weight.  She consumes 2 caffeinated beverages per day, usually in the form of coffee in AM and soda in the afternoon.   Her bedroom is usually dark and cool. There is no TV in the bedroom.   Her Past Medical History Is Significant For: Past Medical History  Diagnosis Date  . Headache(784.0)   . HSV infection     on Valtrex suppression  . Postpartum care and examination of lactating mother 07/13/2011  . Hypotension     normal rens 80/60  . Chronic kidney disease     kindney stones, interstitial cystitis history  . Sleep apnea     NO  SLEEP APNEA BUT STATES HAS PERIODIC LIMB MOVEMENT DISORDER_ WAS IN MIRAPEX PRE PREGNANCY  . Anxiety     hx bipolar, ADHD  . No pertinent past medical history     states with gall bladder attacks with chest pain, vomiting and diarrhes    EKG  in [facility]  . Depression     states well controlled  . GERD (gastroesophageal reflux disease)     hx gallstones and pancreatitis per pt    Her Past Surgical History Is Significant For: Past Surgical History  Procedure Laterality Date  . Cesarean section    . Cesarean section  07/11/2011    Procedure: CESAREAN SECTION;  Surgeon: Princess Bruins;  Location: [facility];  Service: Gynecology;  Laterality: N/A;  Repeat  . Lasik      2007  . Cholecystectomy  11/14/2011    Procedure: LAPAROSCOPIC CHOLECYSTECTOMY WITH INTRAOPERATIVE CHOLANGIOGRAM;  Surgeon: Joyice Faster. Cornett, MD;  Location: [facility];  Service: General;  Laterality: N/A;  Laparoscopic Cholecystectomy with cholangiogram, c-arm  . Tee without cardioversion N/A 11/18/2013    Procedure: TRANSESOPHAGEAL ECHOCARDIOGRAM (TEE);  Surgeon: Laverda Page, MD;  Location: [facility];  Service: Cardiovascular;  Laterality: N/A;    Her Family History Is Significant For: Family History  Problem Relation Age of Onset  . Heart disease Maternal Grandmother     Her Social History Is Significant For: History   Social History  . Marital Status: Married    Spouse Name: N/A    Number of Children: N/A  . Years of Education: N/A   Social History Main Topics  . Smoking status: Former Smoker -- 0.25 packs/day for 1.5 years    Types: Cigarettes    Quit date: 07/31/2007  . Smokeless tobacco: Never Used  . Alcohol Use: 0.6 oz/week    1 Cans of beer per week     Comment: socially before pregnancy  . Drug Use: Yes    Special: Other-see comments, Marijuana     Comment: in college  . Sexual Activity: Yes   Other Topics Concern  . None   Social History Narrative  . None    Her Allergies Are:   Allergies  Allergen Reactions  . Other Itching and Other (See Comments)    TREE NUT.Marland KitchenMOUTH SORES  . Codeine Other (See Comments)    migraines  . Hydrocodone Other (See Comments)    migraines  . Oxycodone Other (See Comments)    migraines  . Ultram [Tramadol Hcl] Other (See Comments)    Migraines/nightmares/ STATES ANY SODIUM BINDING TABLET CAUSES ALLERGIC REACTION  . Vicodin [Hydrocodone-Acetaminophen] Other (See Comments)    migraines  :   Her Current Medications Are:  Outpatient Encounter Prescriptions as of 01/14/2014  Medication Sig  . ALPRAZolam (XANAX) 0.25 MG tablet Take 0.25 mg by mouth 2 (two) times daily as needed for anxiety.  Marland Kitchen  amphetamine-dextroamphetamine (ADDERALL XR) 25 MG 24 hr capsule Take 25 mg by mouth every morning.  . cetirizine (ZYRTEC) 10 MG tablet Take 10 mg by mouth daily.  . clomiPRAMINE (ANAFRANIL) 50 MG capsule Take 150 mg by mouth at bedtime.  Marland Kitchen esomeprazole (NEXIUM) 40 MG capsule Take 40 mg by mouth daily at 12 noon.  . lamoTRIgine (LAMICTAL) 150 MG tablet Take 150 mg by mouth daily.  . naproxen (NAPROSYN) 500 MG tablet Take 1 tablet by mouth as needed.  . norethindrone-ethinyl estradiol (JUNEL FE,GILDESS FE,LOESTRIN FE) 1-20 MG-MCG tablet Take 1 tablet by mouth daily.  . pramipexole (MIRAPEX) 0.125 MG tablet Take by mouth 3 (three) times daily.  . rizatriptan (MAXALT) 10 MG tablet Take 10 mg by mouth as needed for migraine. May repeat in 2 hours if needed  . sertraline (ZOLOFT) 100 MG tablet Take 100 mg by mouth every morning.   . valACYclovir (VALTREX) 500 MG tablet Take 500 mg by mouth 2 (two) times daily as needed. OUTBREAK  . verapamil (VERELAN PM) 120 MG 24 hr capsule Take 1 capsule by mouth daily.  . cholestyramine (QUESTRAN) 4 G packet Take 1 packet by mouth 3 (three) times daily with meals.  . promethazine (PHENERGAN) 25 MG tablet Take 25 mg by mouth every 6 (six) hours as needed for nausea or vomiting.  . ranitidine (ZANTAC) 150 MG capsule  Take 1 capsule (150 mg total) by mouth 2 (two) times daily.  :  Review of Systems:  Out of a complete 14 point review of systems, all are reviewed and negative with the exception of these symptoms as listed below:   Review of Systems  Constitutional: Positive for chills, fatigue and unexpected weight change (gain).  HENT: Positive for hearing loss and tinnitus.   Eyes: Positive for visual disturbance (blurred vision).  Respiratory: Positive for shortness of breath.   Cardiovascular: Negative.   Gastrointestinal: Negative.   Endocrine: Positive for heat intolerance and polydipsia.  Genitourinary: Positive for difficulty urinating.  Musculoskeletal: Negative.   Skin: Negative.   Allergic/Immunologic: Positive for environmental allergies.  Neurological: Positive for dizziness, tremors and headaches.  Hematological: Negative.   Psychiatric/Behavioral: Positive for sleep disturbance (Insomnia, eds, periodic limb movement disorder, not enough sleep) and dysphoric mood. The patient is nervous/anxious.     Objective:  Neurologic Exam  Physical Exam Physical Examination:   Filed Vitals:   01/14/14 1145  BP: 124/80  Pulse: 110  Temp: 99.3 F (37.4 C)    General Examination: The patient is a very pleasant 38 y.o. female in no acute distress. She appears well-developed and well-nourished and well groomed. She is obese.   HEENT: Normocephalic, atraumatic, pupils are equal, round and reactive to light and accommodation. Funduscopic exam is normal with sharp disc margins noted. Extraocular tracking is good without limitation to gaze excursion or nystagmus noted. Normal smooth pursuit is noted. Hearing is grossly intact. Tympanic membranes are clear bilaterally. Face is symmetric with normal facial animation and normal facial sensation. Speech is clear with no dysarthria noted. There is no hypophonia. There is no lip, neck/head, jaw or voice tremor. Neck is supple with full range of passive  and active motion. There are no carotid bruits on auscultation. Oropharynx exam reveals: mild mouth dryness, adequate dental hygiene and mild airway crowding, due to elongated uvula. Mallampati is class II. Tongue protrudes centrally and palate elevates symmetrically. Tonsils are small in size. Neck size is 14.25 inches. She has a tiny overbite. Nasal inspection reveals no significant  nasal mucosal bogginess or redness and she has mild septal deviation to the L and narrow nasal passage.   Chest: Clear to auscultation without wheezing, rhonchi or crackles noted.  Heart: S1+S2+0, regular and normal without murmurs, rubs or gallops noted.   Abdomen: Soft, non-tender and non-distended with normal bowel sounds appreciated on auscultation.  Extremities: There is no pitting edema in the distal lower extremities bilaterally. Pedal pulses are intact.  Skin: Warm and dry without trophic changes noted. There are no varicose veins.  Musculoskeletal: exam reveals no obvious joint deformities, tenderness or joint swelling or erythema.   Neurologically:  Mental status: The patient is awake, alert and oriented in all 4 spheres. Her immediate and remote memory, attention, language skills and fund of knowledge are appropriate. There is no evidence of aphasia, agnosia, apraxia or anomia. Speech is clear with normal prosody and enunciation. Thought process is linear. Mood is normal and affect is normal.  Cranial nerves II - XII are as described above under HEENT exam. In addition: shoulder shrug is normal with equal shoulder height noted. Motor exam: Normal bulk, strength and tone is noted. There is no drift, tremor or rebound. Romberg is negative. Reflexes are 2+ throughout. Babinski: Toes are flexor bilaterally. Fine motor skills and coordination: intact with normal finger taps, normal hand movements, normal rapid alternating patting, normal foot taps and normal foot agility.  Cerebellar testing: No dysmetria or  intention tremor on finger to nose testing. Heel to shin is unremarkable bilaterally. There is no truncal or gait ataxia.  Sensory exam: intact to light touch, pinprick, vibration, temperature sense and proprioception in the upper and lower extremities.  Gait, station and balance: She stands easily. No veering to one side is noted. No leaning to one side is noted. Posture is age-appropriate and stance is narrow based. Gait shows normal stride length and normal pace. No problems turning are noted. She turns en bloc. Tandem walk is unremarkable. Intact toe and heel stance is noted.               Assessment and Plan:   In summary, Kelle C Pizzolato is a very pleasant 38 y.o.-year old female with an underlying medical history of OCD, migraine headaches, allergies, depression, ADHD, bipolar disorder, reflux disease, and interstitial cystitis, who presents with a sleep disorder, including nocturia, PLMD, snoring, resulting in disrupted and poor quality, nonrestorative sleep. In light of her weight gain since her last sleep study and worsening HAs, there is concern for obstructive sleep apnea (OSA). I had a long chat with the patient about my findings and the diagnosis, its prognosis and treatment options. We talked about medical treatments and non-pharmacological approaches. I explained in particular the risks and ramifications of untreated moderate to severe OSA, especially with respect to developing cardiovascular disease down the Road, including congestive heart failure, difficult to treat hypertension, cardiac arrhythmias, or stroke. Even type 2 diabetes has in part been linked to untreated OSA. We talked about trying to maintain a healthy lifestyle in general, as well as the importance of weight control. I encouraged the patient to eat healthy, exercise daily and keep well hydrated, to keep a scheduled bedtime and wake time routine, to not skip any meals and eat healthy snacks in between meals.  I recommended the  following at this time: sleep study with potential positive airway pressure titration.  I explained the sleep test procedure to the patient and also outlined possible surgical and non-surgical treatment options of OSA, including the  use of a custom-made dental device, upper airway surgical options, such as pillar implants, radiofrequency surgery, tongue base surgery, and UPPP. I also explained the CPAP treatment option to the patient, who indicated that she would be willing to try CPAP if the need arises. I explained the importance of being compliant with PAP treatment, not only for insurance purposes but primarily to improve Her symptoms, and for the patient's long term health benefit, including to reduce Her cardiovascular risks. I answered all her questions today and the patient was in agreement. I would like to see her back after the sleep study is completed and encouraged her to call with any interim questions, concerns, problems or updates.   Thank you very much for allowing me to participate in the care of this nice patient. If I can be of any further assistance to you please do not hesitate to call me at [phone].  Sincerely,   Star Age, MD, PhD

## 2014-01-21 ENCOUNTER — Ambulatory Visit (INDEPENDENT_AMBULATORY_CARE_PROVIDER_SITE_OTHER): Payer: BC Managed Care – PPO

## 2014-01-21 DIAGNOSIS — G4733 Obstructive sleep apnea (adult) (pediatric): Secondary | ICD-10-CM

## 2014-01-21 DIAGNOSIS — G479 Sleep disorder, unspecified: Secondary | ICD-10-CM

## 2014-01-21 DIAGNOSIS — R0683 Snoring: Secondary | ICD-10-CM

## 2014-01-21 DIAGNOSIS — G4720 Circadian rhythm sleep disorder, unspecified type: Secondary | ICD-10-CM

## 2014-01-21 DIAGNOSIS — R351 Nocturia: Secondary | ICD-10-CM

## 2014-01-21 DIAGNOSIS — R635 Abnormal weight gain: Secondary | ICD-10-CM

## 2014-01-21 DIAGNOSIS — G4761 Periodic limb movement disorder: Secondary | ICD-10-CM

## 2014-01-21 DIAGNOSIS — G4763 Sleep related bruxism: Secondary | ICD-10-CM

## 2014-01-26 ENCOUNTER — Other Ambulatory Visit (HOSPITAL_COMMUNITY): Payer: Self-pay | Admitting: Psychiatry

## 2014-01-30 ENCOUNTER — Telehealth: Payer: Self-pay | Admitting: Neurology

## 2014-01-30 NOTE — Telephone Encounter (Signed)
Please call and notify the patient that the recent sleep study did not show any significant obstructive sleep apnea. She has some snoring and tooth grinding. Please inform patient that I would like to go over the details of the study during a follow up appointment and if not already previously scheduled, arrange a followup appointment (please utilize a followu-up slot). Also, route or fax report to PCP and referring MD, if other than PCP.  Once you have spoken to patient, you can close this encounter.   Thanks,  Star Age, MD, PhD [facility])

## 2014-02-02 ENCOUNTER — Encounter: Payer: Self-pay | Admitting: *Deleted

## 2014-02-02 NOTE — Telephone Encounter (Signed)
I called and spoke with the patient about her recent sleep study result. I informed the patient that the study did not show any significant obstructive sleep apnea and Dr. Rexene Alberts would like to discuss the sleep study in detail with the patient. Patient is scheduled for March 19,2015 at 11:00 arrival time 10:45. I will fax a copy of the report to Dr. Irven Shelling office and mail a copy of the report to the patient.

## 2014-02-26 ENCOUNTER — Ambulatory Visit: Payer: BC Managed Care – PPO | Admitting: Neurology

## 2014-03-03 ENCOUNTER — Encounter (INDEPENDENT_AMBULATORY_CARE_PROVIDER_SITE_OTHER): Payer: Self-pay

## 2014-03-03 ENCOUNTER — Ambulatory Visit (INDEPENDENT_AMBULATORY_CARE_PROVIDER_SITE_OTHER): Payer: BC Managed Care – PPO | Admitting: Neurology

## 2014-03-03 ENCOUNTER — Encounter: Payer: Self-pay | Admitting: Neurology

## 2014-03-03 VITALS — BP 117/74 | HR 104 | Temp 98.6°F | Ht 65.0 in | Wt 235.0 lb

## 2014-03-03 DIAGNOSIS — G2581 Restless legs syndrome: Secondary | ICD-10-CM

## 2014-03-03 DIAGNOSIS — R519 Headache, unspecified: Secondary | ICD-10-CM

## 2014-03-03 DIAGNOSIS — R51 Headache: Secondary | ICD-10-CM

## 2014-03-03 DIAGNOSIS — G479 Sleep disorder, unspecified: Secondary | ICD-10-CM

## 2014-03-03 HISTORY — DX: Sleep disorder, unspecified: G47.9

## 2014-03-03 NOTE — Progress Notes (Signed)
Subjective:    Patient ID: Gloria Rodriguez is a 38 y.o. female.  HPI    Interim history:   Gloria Rodriguez is a very pleasant 38 year old right-handed woman with an underlying medical history of OCD, migraines, allergies, depression, ADHD, bipolar disorder, reflux disease, and interstitial cystitis, and PLMD, who presents for followup consultation after a recent sleep study. She is unaccompanied today. I first met her on 01/14/2014, at which time she was referred by her cardiologist secondary to chronic complaints of fatigue and shortness of breath, poor exercise tolerance and with a history of a prior sleep study showing periodic leg movements. I suggested a baseline sleep study and she had a diagnostic sleep study on 01/21/2014. I went over her test results with her in detail today. Sleep efficiency was mildly reduced at 83.9% with a latency to sleep mildly prolonged at 32 minutes. Wake after sleep onset was 33 minutes with very mild sleep fragmentation noted. She had a normal arousal index which was actually low normal at 0.7 per hour. She had a normal percentage of stage I sleep, a markedly increased percentage of stage II sleep at 82.4%, absence of deep sleep and a reduced percentage of REM sleep at 15.1% with a markedly prolonged REM latency. She had borderline periodic leg movements in sleep with an index of 4.1 per hour and a lower arousal index of 0.4 per hour. She had a slightly increased heart rate at 101 beats per minute throughout the sleep study as an average with a maximum of 106 and minimum of 94 beats per minute but no significant cardiac arrhythmias on single-lead EKG. She had intermittent mild to moderate snoring. Overall she had a low AHI of 0.7 per hour. This rose to 6.3 per hour in the supine position. Baseline oxygen saturation was 95% with a nadir of 92%. She was noted to have significant tooth grinding at times.   Today, she reports that she is working with Dr. Reuel Boom on her migraines and  had an increase in her verapamil recently. She feels, her HAs are stable, she has exertion HAs. She is working with her Gyn with regards to possible early menopause. She has stable RLS and does use an OTC bite guard for tooth grinding, but has had custom made ones too. She does have TMJ symptoms.   She had a TEE as well as an exercise stress test. She also had a TTE on 10/30/2013, which showed a calculated EF of 57%, small ASD or PFO, otherwise normal findings. TEE on 11/18/2013 showed no evidence of ASD and no right-to-left shunting. She had a exercise stress test on 11/03/2013 which showed poor exercise tolerance but no abnormal findings. She had previously had a sleep study in 2007 which apparently showed periodic limb movements. She grinds her teeth and has been using an OTC bite guard. She also has sleep talking. She sees a Social worker at [place]. She has been sensitive to medications. 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 She takes Mirapex 1 mg at bedtime and Xanax 0.25 mg prn. For her migraines she has been seeing Dr. Reuel Boom.   Her Past Medical History Is Significant For: Past Medical History  Diagnosis Date  . Headache(784.0)   . HSV infection     on Valtrex suppression  . Postpartum care and examination of lactating mother 07/13/2011  . Hypotension     normal rens 80/60  . Chronic kidney disease     kindney  stones, interstitial cystitis history  . Sleep apnea     NO SLEEP APNEA BUT STATES HAS PERIODIC LIMB MOVEMENT DISORDER_ WAS IN MIRAPEX PRE PREGNANCY  . Anxiety     hx bipolar, ADHD  . No pertinent past medical history     states with gall bladder attacks with chest pain, vomiting and diarrhes    EKG  in EPIC  . Depression     states well controlled  . GERD (gastroesophageal reflux disease)     hx gallstones and pancreatitis per pt   Her Past Surgical History Is Significant For: Past Surgical History  Procedure Laterality Date  . Cesarean  section    . Cesarean section  07/11/2011    Procedure: CESAREAN SECTION;  Surgeon: Princess Bruins;  Location: [facility];  Service: Gynecology;  Laterality: N/A;  Repeat  . Lasik      2007  . Cholecystectomy  11/14/2011    Procedure: LAPAROSCOPIC CHOLECYSTECTOMY WITH INTRAOPERATIVE CHOLANGIOGRAM;  Surgeon: Joyice Faster. Cornett, MD;  Location: [facility];  Service: General;  Laterality: N/A;  Laparoscopic Cholecystectomy with cholangiogram, c-arm  . Tee without cardioversion N/A 11/18/2013    Procedure: TRANSESOPHAGEAL ECHOCARDIOGRAM (TEE);  Surgeon: Laverda Page, MD;  Location: [facility];  Service: Cardiovascular;  Laterality: N/A;   Her Family History Is Significant For: Family History  Problem Relation Age of Onset  . Heart disease Maternal Grandmother    Her Social History Is Significant For: History   Social History  . Marital Status: Married    Spouse Name: N/A    Number of Children: N/A  . Years of Education: N/A   Social History Main Topics  . Smoking status: Former Smoker -- 0.25 packs/day for 1.5 years    Types: Cigarettes    Quit date: 07/31/2007  . Smokeless tobacco: Never Used  . Alcohol Use: 0.6 oz/week    1 Cans of beer per week     Comment: socially before pregnancy  . Drug Use: Yes    Special: Other-see comments, Marijuana     Comment: in college  . Sexual Activity: Yes   Other Topics Concern  . None   Social History Narrative  . None   Her Allergies Are:  Allergies  Allergen Reactions  . Other Itching and Other (See Comments)    TREE NUT.Marland KitchenMOUTH SORES  . Codeine Other (See Comments)    migraines  . Hydrocodone Other (See Comments)    migraines  . Oxycodone Other (See Comments)    migraines  . Ultram [Tramadol Hcl] Other (See Comments)    Migraines/nightmares/ STATES ANY SODIUM BINDING TABLET CAUSES ALLERGIC REACTION  . Vicodin [Hydrocodone-Acetaminophen] Other (See Comments)    migraines  :  Her Current Medications Are:  Outpatient Encounter  Prescriptions as of 03/03/2014  Medication Sig  . ALPRAZolam (XANAX) 0.25 MG tablet Take 0.25 mg by mouth 2 (two) times daily as needed for anxiety.  Marland Kitchen amphetamine-dextroamphetamine (ADDERALL XR) 25 MG 24 hr capsule Take 25 mg by mouth every morning.  . cetirizine (ZYRTEC) 10 MG tablet Take 10 mg by mouth daily.  . clomiPRAMINE (ANAFRANIL) 50 MG capsule Take 150 mg by mouth at bedtime.  Marland Kitchen esomeprazole (NEXIUM) 40 MG capsule Take 40 mg by mouth daily at 12 noon.  . lamoTRIgine (LAMICTAL) 150 MG tablet Take 150 mg by mouth daily.  . naproxen (NAPROSYN) 500 MG tablet Take 1 tablet by mouth as needed.  . pramipexole (MIRAPEX) 1 MG tablet Take 1 mg by mouth at bedtime.  Marland Kitchen  sertraline (ZOLOFT) 100 MG tablet Take 100 mg by mouth every morning.   . valACYclovir (VALTREX) 500 MG tablet Take 500 mg by mouth 2 (two) times daily as needed. OUTBREAK  . verapamil (VERELAN PM) 120 MG 24 hr capsule Take 2 capsules by mouth daily.   . norethindrone-ethinyl estradiol (JUNEL FE,GILDESS FE,LOESTRIN FE) 1-20 MG-MCG tablet Take 1 tablet by mouth daily.  . promethazine (PHENERGAN) 25 MG tablet Take 25 mg by mouth every 6 (six) hours as needed for nausea or vomiting.  . rizatriptan (MAXALT) 10 MG tablet Take 10 mg by mouth as needed for migraine. May repeat in 2 hours if needed  . [DISCONTINUED] cholestyramine (QUESTRAN) 4 G packet Take 1 packet by mouth 3 (three) times daily with meals.  . [DISCONTINUED] pramipexole (MIRAPEX) 0.125 MG tablet Take by mouth 3 (three) times daily.  . [DISCONTINUED] ranitidine (ZANTAC) 150 MG capsule Take 1 capsule (150 mg total) by mouth 2 (two) times daily.  : Review of Systems:  Out of a complete 14 point review of systems, all are reviewed and negative with the exception of these symptoms as listed below:   Review of Systems  Constitutional: Positive for diaphoresis and fatigue.  HENT: Positive for hearing loss and tinnitus.   Eyes: Positive for photophobia.  Respiratory: Positive  for shortness of breath.   Cardiovascular: Negative.   Gastrointestinal: Negative.   Endocrine: Positive for heat intolerance and polydipsia.       Flushing   Genitourinary: Positive for urgency and frequency.  Musculoskeletal: Negative.   Skin: Negative.   Allergic/Immunologic: Negative.   Neurological: Positive for dizziness and headaches.  Hematological: Negative.   Psychiatric/Behavioral: Positive for sleep disturbance (Restless legs, insomnia, e.d.s., sleep talking) and dysphoric mood. The patient is nervous/anxious.     Objective:  Neurologic Exam  Physical Exam Physical Examination:   Filed Vitals:   03/03/14 1006  BP: 117/74  Pulse: 104  Temp: 98.6 F (37 C)   General Examination: The patient is a very pleasant 38 y.o. female in no acute distress. She appears well-developed and well-nourished and well groomed. She is obese.   HEENT: Normocephalic, atraumatic, pupils are equal, round and reactive to light and accommodation. Funduscopic exam is normal with sharp disc margins noted. Extraocular tracking is good without limitation to gaze excursion or nystagmus noted. Normal smooth pursuit is noted. Hearing is grossly intact. Tympanic membranes are clear bilaterally. Face is symmetric with normal facial animation and normal facial sensation. Speech is clear with no dysarthria noted. There is no hypophonia. There is no lip, neck/head, jaw or voice tremor. Neck is supple with full range of passive and active motion. There are no carotid bruits on auscultation. Oropharynx exam reveals: mild mouth dryness, adequate dental hygiene and mild airway crowding, due to elongated uvula. Mallampati is class II. Tongue protrudes centrally and palate elevates symmetrically. Tonsils are small in size. Neck size is 14.25 inches. She has a tiny overbite. Nasal inspection reveals no significant nasal mucosal bogginess or redness and she has mild septal deviation to the L and narrow nasal passage.    Chest: Clear to auscultation without wheezing, rhonchi or crackles noted.  Heart: S1+S2+0, regular and normal without murmurs, rubs or gallops noted.   Abdomen: Soft, non-tender and non-distended with normal bowel sounds appreciated on auscultation.  Extremities: There is no pitting edema in the distal lower extremities bilaterally. Pedal pulses are intact.  Skin: Warm and dry without trophic changes noted. There are no varicose veins.  Musculoskeletal:  exam reveals no obvious joint deformities, tenderness or joint swelling or erythema.   Neurologically:  Mental status: The patient is awake, alert and oriented in all 4 spheres. Her immediate and remote memory, attention, language skills and fund of knowledge are appropriate. There is no evidence of aphasia, agnosia, apraxia or anomia. Speech is clear with normal prosody and enunciation. Thought process is linear. Mood is normal and affect is normal.  Cranial nerves II - XII are as described above under HEENT exam. In addition: shoulder shrug is normal with equal shoulder height noted. Motor exam: Normal bulk, strength and tone is noted. There is no drift, tremor or rebound. Reflexes are 2+ throughout. Babinski: Toes are flexor bilaterally. Fine motor skills and coordination: intact with normal finger taps, normal hand movements, normal rapid alternating patting, normal foot taps and normal foot agility.  Cerebellar testing: No dysmetria or intention tremor on finger to nose testing. Heel to shin is unremarkable bilaterally. There is no truncal or gait ataxia.  Sensory exam: intact to light touch, pinprick, vibration, temperature sense in the upper and lower extremities.  Gait, station and balance: She stands easily. No veering to one side is noted. No leaning to one side is noted. Posture is age-appropriate and stance is narrow based. Gait shows normal stride length and normal pace. No problems turning are noted.   Assessment and Plan:   In  summary, Gloria Rodriguez is a very pleasant 38 year old female with an underlying complex history of OCD, migraine headaches, allergies, depression, ADHD, bipolar disorder, reflux disease, and interstitial cystitis, on multiple neuro and psychotropic medications, who presents for FU for her sleep disorder, including nocturia, PLMD, snoring, resulting in disrupted and poor quality, non-restorative sleep. She had a recent sleep study, which did not show any significant sleep disordered breathing. She had borderline sleep apnea when sleeping on her back. For this, she is advised to try to lose weight and stay off her back at night for sleep. She has controlled symptoms of restless leg syndrome. She is on a dopamine agonist. Her headaches are under treatment with Dr. Reuel Boom and she had an increase in her verapamil recently for this. She does have a history of TMJ problems and tooth grinding and does fairly well with over-the-counter bite guards is a posterior custom-made bite guards which she tried on several occasions. She had some changes in her sleep architecture which most likely are because of medication effect including REM suppressant medications such as clomipramine and sertraline. I went over her sleep test results in detail today. At this juncture, I do not think she needs to see me back on a regular basis. Her exam continues to be nonfocal. I would be happy to see her back as needed. She was in agreement. We did talk about maintaining a healthy lifestyle in general, as well as the importance of weight control.

## 2014-03-03 NOTE — Patient Instructions (Signed)
I can probably see you back as needed. Your sleep study did not show significant OSA or leg kicking, no problems with your oxygen level.

## 2014-03-13 ENCOUNTER — Ambulatory Visit (INDEPENDENT_AMBULATORY_CARE_PROVIDER_SITE_OTHER): Payer: BC Managed Care – PPO | Admitting: Family Medicine

## 2014-03-13 VITALS — BP 122/80 | HR 93 | Temp 98.3°F | Resp 16 | Ht 65.5 in | Wt 234.0 lb

## 2014-03-13 DIAGNOSIS — L02416 Cutaneous abscess of left lower limb: Secondary | ICD-10-CM

## 2014-03-13 DIAGNOSIS — L02419 Cutaneous abscess of limb, unspecified: Secondary | ICD-10-CM

## 2014-03-13 DIAGNOSIS — R509 Fever, unspecified: Secondary | ICD-10-CM

## 2014-03-13 DIAGNOSIS — R51 Headache: Secondary | ICD-10-CM

## 2014-03-13 DIAGNOSIS — G8929 Other chronic pain: Secondary | ICD-10-CM

## 2014-03-13 DIAGNOSIS — L03119 Cellulitis of unspecified part of limb: Secondary | ICD-10-CM

## 2014-03-13 DIAGNOSIS — R61 Generalized hyperhidrosis: Secondary | ICD-10-CM

## 2014-03-13 MED ORDER — DOXYCYCLINE HYCLATE 100 MG PO TABS: 100.0000 mg | ORAL_TABLET | Freq: Two times a day (BID) | ORAL | Status: DC

## 2014-03-13 NOTE — Patient Instructions (Addendum)
Soap and water, bandage to wound each day.  Heat if needed - see below. If not continuing to improve in next 3-4 days - can start antibiotic. Return to the clinic or go to the nearest emergency room if any of your symptoms worsen or new symptoms occur.  We will check lyme antibodies, but unlikely.  Follow up with your other care providers regarding the headaches and night sweats/fever symptoms as discussed.   Return to the clinic or go to the nearest emergency room if any of your symptoms worsen or new symptoms occur.

## 2014-03-13 NOTE — Progress Notes (Signed)
Subjective:  This chart was scribed for Wendie Agreste, MD by Ludger Nutting, ED Scribe. This patient was seen in room 10 and the patient's care was started 11:57 AM.   Patient ID: Gloria Rodriguez, female    DOB: 05/26/1976, 38 y.o.   MRN: [id number]  HPI HPI Comments: Gloria Rodriguez is a 38 y.o. female with past medical history of chronic HA's who presents to [facility] complaining of a small area of blackened skin bump to the left hip for the past 6-8 weeks. She reports the area surrounding the discolored area was slightly scaly in texture about dime sized. Patient is concerned it was a tick bite but she has not seen or pulled off a tick from her body. She reports having associated surrounding redness that she noticed about 1 week ago. She also reports having an associated fever of 100 F. She reports using a needle sterilized with alcohol and lanced the area. She reports there was no initial drainage from the area. She applied H2O2 and neosporin. She states the swelling has improved since lancing the area but noticed a small, crater-like area after pulling off the resulting scab. She denies evidence of a tick. 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 She reports pulling off small ticks from the dogs in the past. She denies personal hiking or trips in the woods. She denies new HA's.   She has a history of chronic, daily headaches along with night sweats and fevers. She states there have not been any changes with her headaches and they are long standing. Patient is followed by neurologist and ObGyn for these symptoms.   Patient Active Problem List   Diagnosis Date Noted  . Sleep disorder 03/03/2014   Past Medical History  Diagnosis Date  . Headache(784.0)   . HSV infection     on Valtrex suppression  . Postpartum care and examination of lactating mother 07/13/2011  . Hypotension     normal rens 80/60  . Chronic kidney disease     kindney stones, interstitial cystitis  history  . Sleep apnea     NO SLEEP APNEA BUT STATES HAS PERIODIC LIMB MOVEMENT DISORDER_ WAS IN MIRAPEX PRE PREGNANCY  . Anxiety     hx bipolar, ADHD  . No pertinent past medical history     states with gall bladder attacks with chest pain, vomiting and diarrhes    EKG  in EPIC  . Depression     states well controlled  . GERD (gastroesophageal reflux disease)     hx gallstones and pancreatitis per pt  . Sleep disorder 03/03/2014   Past Surgical History  Procedure Laterality Date  . Cesarean section    . Cesarean section  07/11/2011    Procedure: CESAREAN SECTION;  Surgeon: Princess Bruins;  Location: [facility];  Service: Gynecology;  Laterality: N/A;  Repeat  . Lasik      2007  . Cholecystectomy  11/14/2011    Procedure: LAPAROSCOPIC CHOLECYSTECTOMY WITH INTRAOPERATIVE CHOLANGIOGRAM;  Surgeon: Joyice Faster. Cornett, MD;  Location: [facility];  Service: General;  Laterality: N/A;  Laparoscopic Cholecystectomy with cholangiogram, c-arm  . Tee without cardioversion N/A 11/18/2013    Procedure: TRANSESOPHAGEAL ECHOCARDIOGRAM (TEE);  Surgeon: Laverda Page, MD;  Location: [facility];  Service: Cardiovascular;  Laterality: N/A;   Allergies  Allergen Reactions  . Other Itching and Other (See Comments)    TREE NUT.Marland KitchenMOUTH SORES  . Codeine Other (See Comments)  migraines  . Hydrocodone Other (See Comments)    migraines  . Oxycodone Other (See Comments)    migraines  . Ultram [Tramadol Hcl] Other (See Comments)    Migraines/nightmares/ STATES ANY SODIUM BINDING TABLET CAUSES ALLERGIC REACTION  . Vicodin [Hydrocodone-Acetaminophen] Other (See Comments)    migraines   Prior to Admission medications   Medication Sig Start Date End Date Taking? Authorizing Provider  ALPRAZolam (XANAX) 0.25 MG tablet Take 0.25 mg by mouth 2 (two) times daily as needed for anxiety.   Yes Historical Provider, MD  amphetamine-dextroamphetamine (ADDERALL XR) 25 MG 24 hr capsule Take 25 mg by mouth every morning.    Yes Historical Provider, MD  cetirizine (ZYRTEC) 10 MG tablet Take 10 mg by mouth daily.   Yes Historical Provider, MD  clomiPRAMINE (ANAFRANIL) 50 MG capsule Take 150 mg by mouth at bedtime.   Yes Historical Provider, MD  esomeprazole (NEXIUM) 40 MG capsule Take 40 mg by mouth daily at 12 noon.   Yes Historical Provider, MD  lamoTRIgine (LAMICTAL) 150 MG tablet Take 150 mg by mouth daily.   Yes Historical Provider, MD  naproxen (NAPROSYN) 500 MG tablet Take 1 tablet by mouth as needed. 12/08/13  Yes Historical Provider, MD  pramipexole (MIRAPEX) 1 MG tablet Take 1 mg by mouth at bedtime.   Yes Historical Provider, MD  rizatriptan (MAXALT) 10 MG tablet Take 10 mg by mouth as needed for migraine. May repeat in 2 hours if needed   Yes Historical Provider, MD  sertraline (ZOLOFT) 100 MG tablet Take 100 mg by mouth every morning.    Yes Historical Provider, MD  verapamil (VERELAN PM) 120 MG 24 hr capsule Take 2 capsules by mouth daily.  12/08/13  Yes Historical Provider, MD  norethindrone-ethinyl estradiol (JUNEL FE,GILDESS FE,LOESTRIN FE) 1-20 MG-MCG tablet Take 1 tablet by mouth daily.    Historical Provider, MD  promethazine (PHENERGAN) 25 MG tablet Take 25 mg by mouth every 6 (six) hours as needed for nausea or vomiting.    Historical Provider, MD  valACYclovir (VALTREX) 500 MG tablet Take 500 mg by mouth 2 (two) times daily as needed. OUTBREAK 07/14/11   Darleen Crocker, NP   History   Social History  . Marital Status: Married    Spouse Name: N/A    Number of Children: N/A  . Years of Education: N/A   Occupational History  . Not on file.   Social History Main Topics  . Smoking status: Former Smoker -- 0.25 packs/day for 1.5 years    Types: Cigarettes    Quit date: 07/31/2007  . Smokeless tobacco: Never Used  . Alcohol Use: 0.6 oz/week    1 Cans of beer per week     Comment: socially before pregnancy  . Drug Use: Yes    Special: Other-see comments, Marijuana     Comment: in college    . Sexual Activity: Yes   Other Topics Concern  . Not on file   Social History Narrative  . No narrative on file     Review of Systems  Constitutional: Positive for fever.  Skin: Positive for color change (redness).  Neurological: Negative for headaches (none that are new or worsened).       Objective:   Physical Exam  Nursing note and vitals reviewed. Constitutional: She is oriented to person, place, and time. She appears well-developed and well-nourished.  HENT:  Head: Normocephalic and atraumatic.  Cardiovascular: Normal rate, regular rhythm and normal heart sounds.  No murmur heard. Pulmonary/Chest: Effort normal and breath sounds normal. No respiratory distress. She has no wheezes. She has no rales.  Abdominal: She exhibits no distension.  Neurological: She is alert and oriented to person, place, and time.  Skin: Skin is warm and dry.  Left upper hip area has 1.3 x 0.5 cm erythematous, slightly enlarged area with a 2-3 mm central ulceration; clear fluid centrally. Small amount of exudate in central wound. No induration or fluctuance. No deep involvement. Cultured obtained from central ulceration.   Psychiatric: She has a normal mood and affect.      Filed Vitals:   03/13/14 1131  BP: 122/80  Pulse: 93  Temp: 98.3 F (36.8 C)  TempSrc: Oral  Resp: 16  Height: 5' 5.5" (1.664 m)  Weight: 234 lb (106.142 kg)  SpO2: 99%       Assessment & Plan:    Gloria Rodriguez is a 38 y.o. female Abscess of hip, left - Plan: doxycycline (VIBRA-TABS) 100 MG tablet, Wound culture  Fever, unspecified - Plan: B. Burgdorfi Antibodies  Night sweats - Plan: B. Burgdorfi Antibodies  Chronic headaches - Plan: B. Burgdorfi Antibodies  Healing L hip abscess,with central granulation in small central opening and no induration. Continued sx care with daily cleansing with soap and water, and if not continuing to improve in next few days - can start doxy as below.   Possible tick  exposure and subacute sx's of HA, night sweats. Discussed unlikely lyme dz, but will check antibodies. Discussed continued follow up with neuro, PCP and gyn to further eval these sx's.  RTC precautions.   Meds ordered this encounter  Medications  . doxycycline (VIBRA-TABS) 100 MG tablet    Sig: Take 1 tablet (100 mg total) by mouth 2 (two) times daily.    Dispense:  14 tablet    Refill:  0   Patient Instructions  Soap and water, bandage to wound each day.  Heat if needed - see below. If not continuing to improve in next 3-4 days - can start antibiotic. Return to the clinic or go to the nearest emergency room if any of your symptoms worsen or new symptoms occur.  We will check lyme antibodies, but unlikely.  Follow up with your other care providers regarding the headaches and night sweats/fever symptoms as discussed.   Return to the clinic or go to the nearest emergency room if any of your symptoms worsen or new symptoms occur.     I personally performed the services described in this documentation, which was scribed in my presence. The recorded information has been reviewed and considered, and addended by me as needed.

## 2014-03-15 LAB — WOUND CULTURE
Gram Stain: NONE SEEN
Organism ID, Bacteria: NO GROWTH

## 2014-03-16 ENCOUNTER — Telehealth: Payer: Self-pay

## 2014-03-16 LAB — B. BURGDORFI ANTIBODIES: B burgdorferi Ab IgG+IgM: 0.35 {ISR}

## 2014-03-16 NOTE — Telephone Encounter (Signed)
Patient notified and will start her Doxy today. She will watch the reddened area around the wound if it is not getting better in 48 hours she will RTC.

## 2014-03-16 NOTE — Telephone Encounter (Signed)
Spoke with pt. She says the rash started developing yesterday. It is right around where she was bit by the tick. She says it is about the size of a small peach. It itches sometimes, but has not been painful.  Please advise.

## 2014-03-16 NOTE — Telephone Encounter (Signed)
We did not think this was definitively from a tick and her Lyme tests were normal/negative. Her wound culture also did not show any specific infection at the time it was performed. Did she start the doxycycline?  If so, then would return to clinic to eval reddened area to make sure no change in treatment is indicated.   If she has not started the doxycycline, can do so today, and watch for improvement in redness over next 24-48 hours. If not improving in that time, or continues to enlarge - needs office visit.

## 2014-03-16 NOTE — Telephone Encounter (Signed)
PT STATES WE DID A BLOOD TEST ALONG WITH A SWAB TEST TO SEE IF SHE MAY HAVE LYME DISEASE , HAVE NOW DEVELOPED A RASH AND IS REALLY CONCERNED ABOUT IT PLEASE CALL [phone]

## 2014-03-17 ENCOUNTER — Telehealth: Payer: Self-pay

## 2014-03-17 DIAGNOSIS — L02416 Cutaneous abscess of left lower limb: Secondary | ICD-10-CM

## 2014-03-17 NOTE — Telephone Encounter (Signed)
PATIENT STATES SHE WAS IN THE OFFICE TO SEE DR. GREENE ON Saturday FOR A ABSCESS ON HER HIP. HE WROTE HER A PRESCRIPTION FOR DOXYCYCLINE. HOWEVER, SHE HAS LOST IT AND NEEDS HIM TO WRITE HER ANOTHER ONE.  BEST PHONE [phone] (CELL)  PHARMACY CHOICE IS [facility] [address].  [name]

## 2014-03-18 MED ORDER — DOXYCYCLINE HYCLATE 100 MG PO TABS: 100.0000 mg | ORAL_TABLET | Freq: Two times a day (BID) | ORAL | Status: DC

## 2014-03-18 NOTE — Telephone Encounter (Signed)
I have resent abx to her pharmacy.  Take as directed

## 2014-03-18 NOTE — Telephone Encounter (Signed)
Patient advised.

## 2014-04-07 ENCOUNTER — Other Ambulatory Visit: Payer: Self-pay | Admitting: Neurology

## 2014-04-07 DIAGNOSIS — R519 Headache, unspecified: Secondary | ICD-10-CM

## 2014-04-07 DIAGNOSIS — R51 Headache: Principal | ICD-10-CM

## 2014-04-08 ENCOUNTER — Ambulatory Visit
Admission: RE | Admit: 2014-04-08 | Discharge: 2014-04-08 | Disposition: A | Discharge status: Home / Self Care | Payer: BC Managed Care – PPO | Source: Ambulatory Visit | Attending: Neurology | Admitting: Neurology

## 2014-04-08 DIAGNOSIS — R51 Headache: Principal | ICD-10-CM

## 2014-04-08 DIAGNOSIS — R519 Headache, unspecified: Secondary | ICD-10-CM

## 2014-04-27 ENCOUNTER — Ambulatory Visit (INDEPENDENT_AMBULATORY_CARE_PROVIDER_SITE_OTHER): Payer: BC Managed Care – PPO | Admitting: Internal Medicine

## 2014-04-27 ENCOUNTER — Encounter: Payer: Self-pay | Admitting: Internal Medicine

## 2014-04-27 VITALS — BP 104/68 | HR 97 | Temp 98.5°F | Resp 12 | Ht 65.5 in | Wt 235.0 lb

## 2014-04-27 DIAGNOSIS — Z8349 Family history of other endocrine, nutritional and metabolic diseases: Secondary | ICD-10-CM

## 2014-04-27 NOTE — Patient Instructions (Signed)
We will schedule another appt if the labs are abnormal. We will schedule the thyroid U/S after the labs are back.

## 2014-04-27 NOTE — Progress Notes (Signed)
Patient ID: Gloria Rodriguez, female   DOB: 08-19-1976, 38 y.o.   MRN: [id number]   HPI  Gloria Rodriguez is a 38 y.o.-year-old female, referred by her PCP, Dr. Benjie Karvonen (will see Dellis Filbert), for evaluation for FH of Hashimoto hypothyroidism. Previously seeing Dr Osborne Casco.  I reviewed pt's thyroid tests: TSH (03/31/2014): 2.74 (0.4-4.0) TSH (2013): TSH 1.85, fT4 1.0   Pt is feeling 1 nodule in neck, no hoarseness, no dysphagia/odynophagia, no SOB with lying down.  Pt describes: - + migraines and daily chronic HA (sees neurology) - + chronic fatigue and low energy - + anxiety/+ depression ([place]) - no cold intolerance; + heat intolerance, + flushed - + weight gain: in 2002 187 lbs >> now 235 lbs (in 2009 she intentionally lost 45 lbs in a year)  - + constipation/+ diarrhea - + dry skin, itchy - + hair falling - in last year - + irreg. menses  She has + FH of Hashimoto thyroid disorders in mother, MGM. Also, suspicion of hyperthyroidism in PGM. No FH of thyroid cancer.  No h/o radiation tx to head or neck.  No recent use of iodine supplements. No steroids.   She also has a h/o interstitial cystitis, GERD,   ROS: Constitutional: see HPI, + excessive urination, + nocturia Eyes: + blurry vision, no xerophthalmia ENT: no sore throat, + nodules palpated in throat, no dysphagia/odynophagia, no hoarseness; + tinnitus, + hypoacusis Cardiovascular: no CP/+ SOB/no palpitations/leg swelling Respiratory: no cough/+ SOB Gastrointestinal: + N/no V/+ D/+ C Musculoskeletal: no muscle/joint aches Skin: no rashes Neurological: no tremors/numbness/tingling/dizziness, + HA Psychiatric: + depression/+ anxiety + Low libido  Past Medical History  Diagnosis Date  . Headache(784.0)   . HSV infection     on Valtrex suppression  . Postpartum care and examination of lactating mother 07/13/2011  . Hypotension     normal rens 80/60  . Chronic kidney disease     kindney stones,  interstitial cystitis history  . Sleep apnea     NO SLEEP APNEA BUT STATES HAS PERIODIC LIMB MOVEMENT DISORDER_ WAS IN MIRAPEX PRE PREGNANCY  . Anxiety     hx bipolar, ADHD  . No pertinent past medical history     states with gall bladder attacks with chest pain, vomiting and diarrhes    EKG  in [facility]  . Depression     states well controlled  . GERD (gastroesophageal reflux disease)     hx gallstones and pancreatitis per pt  . Sleep disorder 03/03/2014 Previously seeing Dr Osborne Casco.  I reviewed pt's thyroid tests: TSH (03/31/2014): 2.74 (0.4-4.0) TSH (2013): TSH 1.85, fT4 1.0   Pt is feeling 1 nodule in neck, no hoarseness, no dysphagia/odynophagia, no SOB with lying down.  Pt describes: - + migraines and daily chronic HA (sees neurology) - + chronic fatigue and low energy - + anxiety/+ depression ([place]) - no cold intolerance; + heat intolerance, + flushed - + weight gain: in 2002 187 lbs >> now 235 lbs (in 2009 she intentionally lost 45 lbs in a year)  - + constipation/+ diarrhea - + dry skin, itchy - + hair falling - in last year - + irreg. menses  She has + FH of Hashimoto thyroid disorders in mother, MGM. Also, suspicion of hyperthyroidism in PGM. No FH of thyroid cancer.  No h/o radiation tx to head or neck.  No recent use of iodine supplements. No steroids.   She also has a h/o interstitial cystitis, GERD,   ROS: Constitutional: see HPI, + excessive urination, + nocturia Eyes: + blurry vision, no xerophthalmia ENT: no sore throat, + nodules palpated in throat, no dysphagia/odynophagia, no hoarseness; + tinnitus, + hypoacusis Cardiovascular: no CP/+ SOB/no palpitations/leg swelling Respiratory: no cough/+ SOB Gastrointestinal: + N/no V/+ D/+ C Musculoskeletal: no muscle/joint aches Skin: no rashes Neurological: no tremors/numbness/tingling/dizziness, + HA Psychiatric: + depression/+ anxiety + Low libido  Past Medical History  Diagnosis Date  . Headache(784.0)   . HSV infection     on Valtrex suppression  . Postpartum care and examination of lactating mother 07/13/2011  . Hypotension     normal rens 80/60  . Chronic kidney disease     kindney stones,  interstitial cystitis history  . Sleep apnea     NO SLEEP APNEA BUT STATES HAS PERIODIC LIMB MOVEMENT DISORDER_ WAS IN MIRAPEX PRE PREGNANCY  . Anxiety     hx bipolar, ADHD  . No pertinent past medical history     states with gall bladder attacks with chest pain, vomiting and diarrhes    EKG  in [facility]  . Depression     states well controlled  . GERD (gastroesophageal reflux disease)     hx gallstones and pancreatitis per pt  . Sleep disorder 03/03/2014   Past Surgical History  Procedure Laterality Date  . Cesarean section    . Cesarean section  07/11/2011    Procedure: CESAREAN SECTION;  Surgeon: Princess Bruins;  Location: [facility];  Service: Gynecology;  Laterality: N/A;  Repeat  . Lasik      2007  . Cholecystectomy  11/14/2011    Procedure: LAPAROSCOPIC CHOLECYSTECTOMY WITH INTRAOPERATIVE CHOLANGIOGRAM;  Surgeon: Joyice Faster. Cornett, MD;  Location: [facility];  Service: General;  Laterality: N/A;  Laparoscopic Cholecystectomy with cholangiogram, c-arm  . Tee without cardioversion N/A 11/18/2013    Procedure: TRANSESOPHAGEAL ECHOCARDIOGRAM (TEE);  Surgeon: Laverda Page, MD;  Location: [facility];  Service: Cardiovascular;  Laterality: N/A;   History   Social History  . Marital Status: Married    Spouse Name: N/A    Number of Children: 2   Occupational History  . administrator   Social History Main Topics  . Smoking status: Former Smoker -- 0.25 packs/day for 1.5 years  Types: Cigarettes    Quit date: 07/31/2007  . Smokeless tobacco: Never Used  . Alcohol Use: 0.6 oz/week    1 Cans of beer per week     Comment: socially before pregnancy  . Drug Use: Yes    Special: Other-see comments, Marijuana     Comment: in college  . Sexual Activity: Yes   Current Outpatient Prescriptions on File Prior to Visit  Medication Sig Dispense Refill  . ALPRAZolam (XANAX) 0.25 MG tablet Take 0.25 mg by mouth 2 (two) times daily as needed for anxiety.      Marland Kitchen amphetamine-dextroamphetamine  (ADDERALL XR) 25 MG 24 hr capsule Take 25 mg by mouth every morning.      . cetirizine (ZYRTEC) 10 MG tablet Take 10 mg by mouth daily.      . clomiPRAMINE (ANAFRANIL) 50 MG capsule Take 150 mg by mouth at bedtime.      Marland Kitchen doxycycline (VIBRA-TABS) 100 MG tablet Take 1 tablet (100 mg total) by mouth 2 (two) times daily.  14 tablet  0  . esomeprazole (NEXIUM) 40 MG capsule Take 40 mg by mouth daily at 12 noon.      . lamoTRIgine (LAMICTAL) 150 MG tablet Take 150 mg by mouth daily.      . naproxen (NAPROSYN) 500 MG tablet Take 1 tablet by mouth as needed.      . norethindrone-ethinyl estradiol (JUNEL FE,GILDESS FE,LOESTRIN FE) 1-20 MG-MCG tablet Take 1 tablet by mouth daily.      . pramipexole (MIRAPEX) 1 MG tablet Take 1 mg by mouth at bedtime.      . promethazine (PHENERGAN) 25 MG tablet Take 25 mg by mouth every 6 (six) hours as needed for nausea or vomiting.      . rizatriptan (MAXALT) 10 MG tablet Take 10 mg by mouth as needed for migraine. May repeat in 2 hours if needed      . sertraline (ZOLOFT) 100 MG tablet Take 100 mg by mouth every morning.       . valACYclovir (VALTREX) 500 MG tablet Take 500 mg by mouth 2 (two) times daily as needed. OUTBREAK      . verapamil (VERELAN PM) 120 MG 24 hr capsule Take 2 capsules by mouth daily.        No current facility-administered medications on file prior to visit.   Allergies  Allergen Reactions  . Other Itching and Other (See Comments)    TREE NUT.Marland KitchenMOUTH SORES  . Codeine Other (See Comments)    migraines  . Hydrocodone Other (See Comments)    migraines  . Oxycodone Other (See Comments)    migraines  . Ultram [Tramadol Hcl] Other (See Comments)    Migraines/nightmares/ STATES ANY SODIUM BINDING TABLET CAUSES ALLERGIC REACTION  . Vicodin [Hydrocodone-Acetaminophen] Other (See Comments)    migraines   Family History  Problem Relation Age of Onset  . Heart disease Maternal Grandmother    PE: BP 104/68  Pulse 97  Temp(Src) 98.5 F (36.9  C) (Oral)  Resp 12  Ht 5' 5.5" (1.664 m)  Wt 235 lb (106.595 kg)  BMI 38.50 kg/m2  SpO2 98% Wt Readings from Last 3 Encounters:  04/27/14 235 lb (106.595 kg)  03/13/14 234 lb (106.142 kg)  03/03/14 235 lb (106.595 kg)   Constitutional: obese - class II, in NAD Eyes: PERRLA, EOMI, no exophthalmos ENT: moist mucous membranes, no thyromegaly, no cervical lymphadenopathy Cardiovascular: RRR, No MRG Respiratory: CTA B Gastrointestinal: abdomen soft, NT, ND, BS+ Musculoskeletal: no  deformities, strength intact in all 4 Skin: moist, warm, no rashes Neurological: no tremor with outstretched hands, DTR normal in all 4 ASSESSMENT: 1. FH of Hashimoto's thyroiditis  PLAN:  1. Patient with FH of Hashimoto's Hypothyroidism. She has had normal TSH levels but has many complaints.  - She does not appear to have a goiter, thyroid nodules, or neck compression symptoms. She feels a lump on the front of her neck, which I do not feel, but will get a thyroid U/S to clarify - we'll check thyroid tests today: TSH, free T4, free T3 and add TPO antibodies - If these are normal, she will continue to follow with ObGyn or PCP - she will return to see me if TSH becomes abnormal in the future of if she has thyroid nodules  Office Visit on 04/27/2014  Component Date Value Ref Range Status  . TSH 04/27/2014 1.16  0.35 - 4.50 uIU/mL Final  . Free T4 04/27/2014 0.63  0.60 - 1.60 ng/dL Final  . T3, Free 04/27/2014 3.6  2.3 - 4.2 pg/mL Final  . Thyroid Peroxidase Antibody 04/27/2014 <10.0  <35.0 IU/mL Final   Comment:                            The thyroid microsomal antigen has been shown to be Thyroid                          Peroxidase (TPO).  This assay detects anti-TPO antibodies.   No Hashimoto's thyroiditis.

## 2014-04-28 LAB — T4, FREE: Free T4: 0.63 ng/dL (ref 0.60–1.60)

## 2014-04-28 LAB — T3, FREE: T3, Free: 3.6 pg/mL (ref 2.3–4.2)

## 2014-04-28 LAB — TSH: TSH: 1.16 u[IU]/mL (ref 0.35–4.50)

## 2014-04-29 ENCOUNTER — Encounter: Payer: Self-pay | Admitting: Internal Medicine

## 2014-04-29 LAB — THYROID PEROXIDASE ANTIBODY: Thyroperoxidase Ab SerPl-aCnc: <10 IU/mL (ref <35.0)

## 2014-10-12 ENCOUNTER — Encounter: Payer: Self-pay | Admitting: Internal Medicine

## 2014-10-19 ENCOUNTER — Ambulatory Visit: Payer: BC Managed Care – PPO | Admitting: Family Medicine

## 2014-10-23 ENCOUNTER — Ambulatory Visit: Payer: BC Managed Care – PPO | Admitting: Family Medicine

## 2014-11-17 ENCOUNTER — Ambulatory Visit: Payer: BC Managed Care – PPO | Admitting: Family Medicine

## 2014-12-11 NOTE — Telephone Encounter (Signed)
Refill was denied Feb 2015, pharmacy was notified at that time. Cleaning out MD in basket.

## 2014-12-28 ENCOUNTER — Encounter: Payer: Self-pay | Admitting: Physician Assistant

## 2014-12-28 ENCOUNTER — Ambulatory Visit (INDEPENDENT_AMBULATORY_CARE_PROVIDER_SITE_OTHER): Payer: BC Managed Care – PPO

## 2014-12-28 ENCOUNTER — Ambulatory Visit (INDEPENDENT_AMBULATORY_CARE_PROVIDER_SITE_OTHER): Payer: BC Managed Care – PPO | Admitting: Family Medicine

## 2014-12-28 VITALS — BP 120/78 | HR 86 | Temp 98.0°F | Resp 16 | Ht 65.5 in | Wt 257.0 lb

## 2014-12-28 DIAGNOSIS — S20212A Contusion of left front wall of thorax, initial encounter: Secondary | ICD-10-CM

## 2014-12-28 DIAGNOSIS — R0781 Pleurodynia: Secondary | ICD-10-CM

## 2014-12-28 DIAGNOSIS — R0789 Other chest pain: Secondary | ICD-10-CM

## 2014-12-28 DIAGNOSIS — S5002XA Contusion of left elbow, initial encounter: Secondary | ICD-10-CM

## 2014-12-28 DIAGNOSIS — S298XXA Other specified injuries of thorax, initial encounter: Secondary | ICD-10-CM

## 2014-12-28 DIAGNOSIS — G43909 Migraine, unspecified, not intractable, without status migrainosus: Secondary | ICD-10-CM | POA: Insufficient documentation

## 2014-12-28 DIAGNOSIS — R5383 Other fatigue: Secondary | ICD-10-CM

## 2014-12-28 DIAGNOSIS — R42 Dizziness and giddiness: Secondary | ICD-10-CM

## 2014-12-28 LAB — POCT CBC
Granulocyte percent: 71.6 %G (ref 37–80)
HCT, POC: 39.3 % (ref 37.7–47.9)
HEMOGLOBIN: 12.7 g/dL (ref 12.2–16.2)
Lymph, poc: 2 (ref 0.6–3.4)
MCH, POC: 27.3 pg (ref 27–31.2)
MCHC: 32.3 g/dL (ref 31.8–35.4)
MCV: 84.4 fL (ref 80–97)
MID (CBC): 0.6 (ref 0–0.9)
MPV: 8.6 fL (ref 0–99.8)
POC Granulocyte: 6.6 (ref 2–6.9)
POC LYMPH %: 22.2 % (ref 10–50)
POC MID %: 6.2 % (ref 0–12)
Platelet Count, POC: 258 10*3/uL (ref 142–424)
RBC: 4.65 M/uL (ref 4.04–5.48)
RDW, POC: 16.8 %
WBC: 9.2 10*3/uL (ref 4.6–10.2)

## 2014-12-28 LAB — TSH: TSH: 2.623 u[IU]/mL (ref 0.350–4.500)

## 2014-12-28 NOTE — Patient Instructions (Signed)
Return in 7-10 days if not getting any better. Continue with naproxen for pain. Rest and drink plenty of water.

## 2014-12-28 NOTE — Progress Notes (Signed)
Subjective:    Patient ID: Gloria Rodriguez, female    DOB: [birth year], 39 y.o.   MRN: [id number]  HPI  This is a 39 year old female who is  presenting with 4 days of left rib pain and left elbow pain following an injury. She reports her daughter was chasing her around the house and she tripped and fell onto the corner of a coffee table. She reports her elbow pain is improving but her rib pain is not. The area is tender to the touch and when she moves certain ways. She denies pleuritic pains but does feel she is "having a hard time catching my breath". She is also having some medial elbow pain but reports this is improving. Her movement is not limited. She has tried naproxen, Has tried naproxen, arnica gel and ice with some relief.  She is also endorsing for the past 4 days feeling more fatigued than normal. She has had a slight sore throat in the mornings. Twice she has felt lightheaded with standing. She is trying to drink plenty of water. She is on atenolol 25 mg QD for migraine headaches. She denies fever, chills, vertigo, nasal congestion, otalgia, cough, N/V/D, urinary symptoms.  Review of Systems  Constitutional: Positive for fatigue. Negative for fever and chills.  HENT: Positive for sore throat. Negative for congestion, ear pain and sinus pressure.   Eyes: Negative for redness.  Respiratory: Negative for cough, shortness of breath and wheezing.   Gastrointestinal: Negative for nausea, vomiting, abdominal pain and diarrhea.  Genitourinary: Negative for dysuria and difficulty urinating.  Musculoskeletal: Positive for myalgias.  Skin: Positive for color change.  Allergic/Immunologic: Positive for environmental allergies.  Neurological: Positive for light-headedness. Negative for syncope.  Psychiatric/Behavioral: Negative for sleep disturbance.    Patient Active Problem List   Diagnosis Date Noted  . Migraine 12/28/2014  . Family history of Hashimoto thyroiditis 04/27/2014  . Sleep  disorder 03/03/2014   Prior to Admission medications   Medication Sig Start Date End Date Taking? Authorizing Provider  ALPRAZolam (XANAX) 0.25 MG tablet Take 0.25 mg by mouth 2 (two) times daily as needed for anxiety.   Yes Historical Provider, MD  amphetamine-dextroamphetamine (ADDERALL XR) 25 MG 24 hr capsule Take 25 mg by mouth every morning.   Yes Historical Provider, MD  atenolol (TENORMIN) 25 MG tablet Take 25 mg by mouth daily.  04/10/14  Yes Historical Provider, MD  cetirizine (ZYRTEC) 10 MG tablet Take 10 mg by mouth daily.   Yes Historical Provider, MD  clomiPRAMINE (ANAFRANIL) 50 MG capsule Take 150 mg by mouth at bedtime.   Yes Historical Provider, MD  dihydroergotamine (MIGRANAL) 4 MG/ML nasal spray Place 1 spray into the nose as needed.  03/26/14  Yes Historical Provider, MD  esomeprazole (NEXIUM) 40 MG capsule Take 40 mg by mouth daily at 12 noon.   Yes Historical Provider, MD  lamoTRIgine (LAMICTAL) 150 MG tablet Take 150 mg by mouth daily.   Yes Historical Provider, MD  naproxen (NAPROSYN) 500 MG tablet Take 1 tablet by mouth as needed. 12/08/13  Yes Historical Provider, MD  pramipexole (MIRAPEX) 1 MG tablet Take 1 mg by mouth at bedtime.   Yes Historical Provider, MD  promethazine (PHENERGAN) 25 MG tablet Take 25 mg by mouth every 6 (six) hours as needed for nausea or vomiting.   Yes Historical Provider, MD  rizatriptan (MAXALT) 10 MG tablet Take 10 mg by mouth as needed for migraine. May repeat in 2 hours if needed  Yes Historical Provider, MD  sertraline (ZOLOFT) 100 MG tablet Take 100 mg by mouth every morning.    Yes Historical Provider, MD  valACYclovir (VALTREX) 500 MG tablet Take 500 mg by mouth 2 (two) times daily as needed. OUTBREAK 07/14/11  Yes Darleen Crocker, NP       Objective:   Physical Exam  Constitutional: She is oriented to person, place, and time. She appears well-developed and well-nourished. No distress.  HENT:  Head: Normocephalic and atraumatic.  Right  Ear: Hearing, tympanic membrane, external ear and ear canal normal.  Left Ear: Hearing, tympanic membrane, external ear and ear canal normal.  Nose: Nose normal. Right sinus exhibits no maxillary sinus tenderness and no frontal sinus tenderness. Left sinus exhibits no maxillary sinus tenderness and no frontal sinus tenderness.  Mouth/Throat: Uvula is midline, oropharynx is clear and moist and mucous membranes are normal.  Eyes: Conjunctivae and lids are normal. Right eye exhibits no discharge. Left eye exhibits no discharge. No scleral icterus.  Cardiovascular: Normal rate, regular rhythm, normal heart sounds, intact distal pulses and normal pulses.   No murmur heard. Pulmonary/Chest: Effort normal and breath sounds normal. No respiratory distress. She has no wheezes. She has no rhonchi. She has no rales. She exhibits tenderness.    6x4 cm ecchymotic area on left lateral chest.  Abdominal: Soft. Normal appearance. There is no tenderness.  Musculoskeletal: Normal range of motion.       Left elbow: She exhibits normal range of motion. Tenderness (medial elbow soft tissue, no significant bony tenderness) found. Medial epicondyle (slight) tenderness noted. No radial head, no lateral epicondyle and no olecranon process tenderness noted.  Ecchymosis over medial elbow  Lymphadenopathy:       Head (right side): No submental, no submandibular and no tonsillar adenopathy present.       Head (left side): No submental, no submandibular and no tonsillar adenopathy present.    She has no cervical adenopathy.  Neurological: She is alert and oriented to person, place, and time.  Skin: Skin is warm, dry and intact. Ecchymosis noted.  Psychiatric: She has a normal mood and affect. Her speech is normal and behavior is normal. Thought content normal.   BP 120/78 mmHg  Pulse 86  Temp(Src) 98 F (36.7 C) (Oral)  Resp 16  Ht 5' 5.5" (1.664 m)  Wt 257 lb (116.574 kg)  BMI 42.10 kg/m2  SpO2 100%  LMP    Results for orders placed or performed in visit on 12/28/14  POCT CBC  Result Value Ref Range   WBC 9.2 4.6 - 10.2 K/uL   Lymph, poc 2.0 0.6 - 3.4   POC LYMPH PERCENT 22.2 10 - 50 %L   MID (cbc) 0.6 0 - 0.9   POC MID % 6.2 0 - 12 %M   POC Granulocyte 6.6 2 - 6.9   Granulocyte percent 71.6 37 - 80 %G   RBC 4.65 4.04 - 5.48 M/uL   Hemoglobin 12.7 12.2 - 16.2 g/dL   HCT, POC 39.3 37.7 - 47.9 %   MCV 84.4 80 - 97 fL   MCH, POC 27.3 27 - 31.2 pg   MCHC 32.3 31.8 - 35.4 g/dL   RDW, POC 16.8 %   Platelet Count, POC 258 142 - 424 K/uL   MPV 8.6 0 - 99.8 fL   [facility] reading (PRIMARY) by  Dr. Lorelei Pont: negative     Assessment & Plan:  1. Other fatigue 2. Light headedness CBC normal. TSH pending.  She is possibly fighting a subclinical viral illness. If her symptoms continue in 7-10 days she will return for further evaluation. - POCT CBC - TSH  3. Rib pain on left side 4. Rib contusion, left 5. Elbow contusion, left Radiograph negative for acute abnormality. Likely a rib contusion. Elbow contusion likely as well - elbow pain has improved and only very slight bony tenderness over medial epicondyle and so did not obtain radiograph. Pt will continue with naproxen and ice. She will return if symptoms are not improving in 7-10 days. - DG Ribs Unilateral W/Chest Left; Future   Benjaman Pott. Drenda Freeze, M[facility]  12/28/2014

## 2015-03-25 ENCOUNTER — Ambulatory Visit (INDEPENDENT_AMBULATORY_CARE_PROVIDER_SITE_OTHER): Payer: BC Managed Care – PPO | Admitting: Family Medicine

## 2015-03-25 VITALS — BP 110/72 | HR 70 | Temp 98.0°F | Resp 18 | Ht 65.5 in | Wt 254.0 lb

## 2015-03-25 DIAGNOSIS — R197 Diarrhea, unspecified: Secondary | ICD-10-CM | POA: Diagnosis not present

## 2015-03-25 LAB — POCT CBC
GRANULOCYTE PERCENT: 66.3 % (ref 37–80)
HCT, POC: 38.3 % (ref 37.7–47.9)
Hemoglobin: 12.2 g/dL (ref 12.2–16.2)
Lymph, poc: 2.5 (ref 0.6–3.4)
MCH, POC: 26.9 pg — AB (ref 27–31.2)
MCHC: 31.9 g/dL (ref 31.8–35.4)
MCV: 84.2 fL (ref 80–97)
MID (cbc): 0.2 (ref 0–0.9)
MPV: 7.6 fL (ref 0–99.8)
POC Granulocyte: 5.4 (ref 2–6.9)
POC LYMPH PERCENT: 31 %L (ref 10–50)
POC MID %: 2.7 %M (ref 0–12)
Platelet Count, POC: 267 10*3/uL (ref 142–424)
RBC: 4.54 M/uL (ref 4.04–5.48)
RDW, POC: 18 %
WBC: 8.1 10*3/uL (ref 4.6–10.2)

## 2015-03-25 LAB — POCT URINE PREGNANCY: PREG TEST UR: NEGATIVE

## 2015-03-25 MED ORDER — CIPROFLOXACIN HCL 500 MG PO TABS: 500.0000 mg | ORAL_TABLET | Freq: Two times a day (BID) | ORAL | Status: DC

## 2015-03-25 MED ORDER — METRONIDAZOLE 500 MG PO TABS: 500.0000 mg | ORAL_TABLET | Freq: Two times a day (BID) | ORAL | Status: DC

## 2015-03-25 NOTE — Progress Notes (Addendum)
[facility] [address] [place] [address] [phone]  Date:  03/25/2015   Name:  Gloria Rodriguez   DOB:  1976-06-05   MRN:  [id number]  PCP:  Princess Bruins, MD    Chief Complaint: GI Problem; Diarrhea; Nausea; Fatigue; Fever; hotflashes; and Abdominal Pain   History of Present Illness:  Gloria Rodriguez is a 39 y.o. very pleasant female patient who presents with the following:  She is here today with some abd sx that she has noted over a couple of weeks. She first noted a "quivering" feeling in her belly that seemed to move around.  Then about 2 weeks ago she noted onset of watery diarrhea and malaise.  They thought this might be due to a viral illness of some sort.  Diarrhea appeared yellow, then as the diarrhea got better her stools turned gray.  This am she went to the bathroom and noted a "combination of several types" of stool, but it was loose and appeared yellow in color again.   She also feels that her muscles hurt.  Her sx seem worse if she eats sugar or other complex carbs No vomiting but she did have some nausea No blood in her stools, no black stools.   She does tend to have kind of a sensitive stomach and had some diarrhea, but it seemed that this was due to lactose intolerance and she got better when they switched to soy milk. She had a temp of 99 a couple of days ago.   She also has noted that her cheeks look flushed.   She does tend to be a heavy sweater.  Her gallbladder was removed a few years ago  No history of thyroid problems.  She tested normal and has seen endocrinology- been told all ok.  She last had her thryroid tested about 6 months ago She has a Mirena IUD, irregular menses   Patient Active Problem List   Diagnosis Date Noted  . Migraine 12/28/2014  . Family history of Hashimoto thyroiditis 04/27/2014  . Sleep disorder 03/03/2014    Past Medical History  Diagnosis Date  . Headache(784.0)   . HSV infection     on Valtrex  suppression  . Postpartum care and examination of lactating mother 07/13/2011  . Hypotension     normal rens 80/60  . Chronic kidney disease     kindney stones, interstitial cystitis history  . Sleep apnea     NO SLEEP APNEA BUT STATES HAS PERIODIC LIMB MOVEMENT DISORDER_ WAS IN MIRAPEX PRE PREGNANCY  . Anxiety     hx bipolar, ADHD  . No pertinent past medical history     states with gall bladder attacks with chest pain, vomiting and diarrhes    EKG  in EPIC  . Depression     states well controlled  . GERD (gastroesophageal reflux disease)     hx gallstones and pancreatitis per pt  . Sleep disorder 03/03/2014    Past Surgical History  Procedure Laterality Date  . Cesarean section    . Cesarean section  07/11/2011    Procedure: CESAREAN SECTION;  Surgeon: Princess Bruins;  Location: [facility];  Service: Gynecology;  Laterality: N/A;  Repeat  . Lasik      2007  . Cholecystectomy  11/14/2011    Procedure: LAPAROSCOPIC CHOLECYSTECTOMY WITH INTRAOPERATIVE CHOLANGIOGRAM;  Surgeon: Joyice Faster. Cornett, MD;  Location: [facility];  Service: General;  Laterality: N/A;  Laparoscopic Cholecystectomy with cholangiogram, c-arm  .  Tee without cardioversion N/A 11/18/2013    Procedure: TRANSESOPHAGEAL ECHOCARDIOGRAM (TEE);  Surgeon: Laverda Page, MD;  Location: [facility];  Service: Cardiovascular;  Laterality: N/A;    History  Substance Use Topics  . Smoking status: Former Smoker -- 0.25 packs/day for 1.5 years    Types: Cigarettes    Quit date: 07/31/2007  . Smokeless tobacco: Never Used  . Alcohol Use: 0.6 oz/week    1 Cans of beer per week     Comment: socially before pregnancy    Family History  Problem Relation Age of Onset  . Heart disease Maternal Grandmother     Allergies  Allergen Reactions  . Other Itching and Other (See Comments)    TREE NUT.Marland KitchenMOUTH SORES  . Codeine Other (See Comments)    migraines  . Hydrocodone Other (See Comments)    migraines  . Oxycodone Other (See  Comments)    migraines  . Ultram [Tramadol Hcl] Other (See Comments)    Migraines/nightmares/ STATES ANY SODIUM BINDING TABLET CAUSES ALLERGIC REACTION  . Vicodin [Hydrocodone-Acetaminophen] Other (See Comments)    migraines    Medication list has been reviewed and updated.  Current Outpatient Prescriptions on File Prior to Visit  Medication Sig Dispense Refill  . ALPRAZolam (XANAX) 0.25 MG tablet Take 0.25 mg by mouth 2 (two) times daily as needed for anxiety.    Marland Kitchen amphetamine-dextroamphetamine (ADDERALL XR) 25 MG 24 hr capsule Take 25 mg by mouth every morning.    Marland Kitchen atenolol (TENORMIN) 25 MG tablet Take 25 mg by mouth 2 (two) times daily.     . cetirizine (ZYRTEC) 10 MG tablet Take 10 mg by mouth 2 (two) times daily.     . clomiPRAMINE (ANAFRANIL) 50 MG capsule Take 75 mg by mouth at bedtime.     . dihydroergotamine (MIGRANAL) 4 MG/ML nasal spray Place 1 spray into the nose as needed.     Marland Kitchen esomeprazole (NEXIUM) 40 MG capsule Take 40 mg by mouth daily at 12 noon.    . lamoTRIgine (LAMICTAL) 150 MG tablet Take 150 mg by mouth daily.    . naproxen (NAPROSYN) 500 MG tablet Take 1 tablet by mouth as needed.    . pramipexole (MIRAPEX) 1 MG tablet Take 1 mg by mouth at bedtime.    . promethazine (PHENERGAN) 25 MG tablet Take 25 mg by mouth every 6 (six) hours as needed for nausea or vomiting.    . rizatriptan (MAXALT) 10 MG tablet Take 10 mg by mouth as needed for migraine. May repeat in 2 hours if needed    . sertraline (ZOLOFT) 100 MG tablet Take 100 mg by mouth every morning.     . valACYclovir (VALTREX) 500 MG tablet Take 500 mg by mouth 2 (two) times daily as needed. OUTBREAK     No current facility-administered medications on file prior to visit.    Review of Systems:  As per HPI- otherwise negative. She denies any use of abx in the last 6 months  There is a family history of IBS but not of IBD  Physical Examination: Filed Vitals:   03/25/15 1144  BP: 110/72  Pulse: 100   Temp: 98 F (36.7 C)  Resp: 18   Filed Vitals:   03/25/15 1144  Height: 5' 5.5" (1.664 m)  Weight: 254 lb (115.214 kg)   Body mass index is 41.61 kg/(m^2). Ideal Body Weight: Weight in (lb) to have BMI = 25: 152.2  GEN: WDWN, NAD, Non-toxic, A & O x  3, obese, looks well HEENT: Atraumatic, Normocephalic. Neck supple. No masses, No LAD.  Bilateral TM wnl, oropharynx normal.  PEERL,EOMI.   Ears and Nose: No external deformity. CV: RRR, No M/G/R. No JVD. No thrill. No extra heart sounds. PULM: CTA B, no wheezes, crackles, rhonchi. No retractions. No resp. distress. No accessory muscle use. ABD: S, NT, ND, +BS. No rebound. No HSM.  Belly is benign to exam.  Somewhat increased bowel sounds EXTR: No c/c/e NEURO Normal gait.  PSYCH: Normally interactive. Conversant. Not depressed or anxious appearing.  Calm demeanor.   Results for orders placed or performed in visit on 03/25/15  POCT CBC  Result Value Ref Range   WBC 8.1 4.6 - 10.2 K/uL   Lymph, poc 2.5 0.6 - 3.4   POC LYMPH PERCENT 31.0 10 - 50 %L   MID (cbc) 0.2 0 - 0.9   POC MID % 2.7 0 - 12 %M   POC Granulocyte 5.4 2 - 6.9   Granulocyte percent 66.3 37 - 80 %G   RBC 4.54 4.04 - 5.48 M/uL   Hemoglobin 12.2 12.2 - 16.2 g/dL   HCT, POC 38.3 37.7 - 47.9 %   MCV 84.2 80 - 97 fL   MCH, POC 26.9 (A) 27 - 31.2 pg   MCHC 31.9 31.8 - 35.4 g/dL   RDW, POC 18.0 %   Platelet Count, POC 267 142 - 424 K/uL   MPV 7.6 0 - 99.8 fL  POCT urine pregnancy  Result Value Ref Range   Preg Test, Ur Negative    Assessment and Plan: Diarrhea - Plan: POCT CBC, POCT urine pregnancy, Comprehensive metabolic panel, Stool culture, Ova and parasite examination, ciprofloxacin (CIPRO) 500 MG tablet, metroNIDAZOLE (FLAGYL) 500 MG tablet  Persistent diarrhea and GI discomfort, benign exam today.  Await stool tests and CMP, will start treatment with cipro and flagyl for likley colitis.  If this does not help consider IBS.   Cautioned regarding drug  interactions with cipro- she rarely uses the xanax, phenergan or migranal She will let me know if not feeling better in the next couple of days Collect stool sample prior to starting abx  Signed Lamar Blinks, MD  Received all of her labs below on 4/19- gave her a call.  LMOM with labs, please call if not better  Results for orders placed or performed in visit on 03/25/15  Stool culture  Result Value Ref Range   Preliminary Report No Suspicious Colonies, Continuing to Hold   Ova and parasite examination  Result Value Ref Range   OP No Ova or Parasites Seen    Comprehensive metabolic panel  Result Value Ref Range   Sodium 137 135 - 145 mEq/L   Potassium 4.9 3.5 - 5.3 mEq/L   Chloride 103 96 - 112 mEq/L   CO2 27 19 - 32 mEq/L   Glucose, Bld 79 70 - 99 mg/dL   BUN 10 6 - 23 mg/dL   Creat 0.73 0.50 - 1.10 mg/dL   Total Bilirubin 0.3 0.2 - 1.2 mg/dL   Alkaline Phosphatase 77 39 - 117 U/L   AST 19 0 - 37 U/L   ALT 21 0 - 35 U/L   Total Protein 6.8 6.0 - 8.3 g/dL   Albumin 3.8 3.5 - 5.2 g/dL   Calcium 9.3 8.4 - 10.5 mg/dL  POCT CBC  Result Value Ref Range   WBC 8.1 4.6 - 10.2 K/uL   Lymph, poc 2.5 0.6 - 3.4  POC LYMPH PERCENT 31.0 10 - 50 %L   MID (cbc) 0.2 0 - 0.9   POC MID % 2.7 0 - 12 %M   POC Granulocyte 5.4 2 - 6.9   Granulocyte percent 66.3 37 - 80 %G   RBC 4.54 4.04 - 5.48 M/uL   Hemoglobin 12.2 12.2 - 16.2 g/dL   HCT, POC 38.3 37.7 - 47.9 %   MCV 84.2 80 - 97 fL   MCH, POC 26.9 (A) 27 - 31.2 pg   MCHC 31.9 31.8 - 35.4 g/dL   RDW, POC 18.0 %   Platelet Count, POC 267 142 - 424 K/uL   MPV 7.6 0 - 99.8 fL  POCT urine pregnancy  Result Value Ref Range   Preg Test, Ur Negative

## 2015-03-25 NOTE — Patient Instructions (Signed)
Use the cipro and flagyl as directed for your GI symptoms.   Let me know if you are not feeling better soon- Sooner if worse.   I will be in touch with the rest of your labs

## 2015-03-26 LAB — COMPREHENSIVE METABOLIC PANEL
ALBUMIN: 3.8 g/dL (ref 3.5–5.2)
ALK PHOS: 77 U/L (ref 39–117)
ALT: 21 U/L (ref 0–35)
AST: 19 U/L (ref 0–37)
BUN: 10 mg/dL (ref 6–23)
CALCIUM: 9.3 mg/dL (ref 8.4–10.5)
CO2: 27 meq/L (ref 19–32)
Chloride: 103 mEq/L (ref 96–112)
Creat: 0.73 mg/dL (ref 0.50–1.10)
Glucose, Bld: 79 mg/dL (ref 70–99)
Potassium: 4.9 mEq/L (ref 3.5–5.3)
Sodium: 137 mEq/L (ref 135–145)
TOTAL PROTEIN: 6.8 g/dL (ref 6.0–8.3)
Total Bilirubin: 0.3 mg/dL (ref 0.2–1.2)

## 2015-03-30 LAB — OVA AND PARASITE EXAMINATION: OP: NONE SEEN

## 2015-03-31 LAB — STOOL CULTURE

## 2015-04-09 ENCOUNTER — Ambulatory Visit: Payer: BC Managed Care – PPO | Admitting: Family Medicine

## 2015-06-25 ENCOUNTER — Ambulatory Visit (INDEPENDENT_AMBULATORY_CARE_PROVIDER_SITE_OTHER): Payer: BC Managed Care – PPO | Admitting: Emergency Medicine

## 2015-06-25 VITALS — BP 112/72 | HR 99 | Temp 98.6°F | Resp 18 | Ht 66.0 in | Wt 251.0 lb

## 2015-06-25 DIAGNOSIS — L723 Sebaceous cyst: Secondary | ICD-10-CM | POA: Diagnosis not present

## 2015-06-25 NOTE — Patient Instructions (Signed)
Epidermal Cyst An epidermal cyst is sometimes called a sebaceous cyst, epidermal inclusion cyst, or infundibular cyst. These cysts usually contain a substance that looks "pasty" or "cheesy" and may have a bad smell. This substance is a protein called keratin. Epidermal cysts are usually found on the face, neck, or trunk. They may also occur in the vaginal area or other parts of the genitalia of both men and women. Epidermal cysts are usually small, painless, slow-growing bumps or lumps that move freely under the skin. It is important not to try to pop them. This may cause an infection and lead to tenderness and swelling. CAUSES  Epidermal cysts may be caused by a deep penetrating injury to the skin or a plugged hair follicle, often associated with acne. SYMPTOMS  Epidermal cysts can become inflamed and cause:  Redness.  Tenderness.  Increased temperature of the skin over the bumps or lumps.  Grayish-white, bad smelling material that drains from the bump or lump. DIAGNOSIS  Epidermal cysts are easily diagnosed by your caregiver during an exam. Rarely, a tissue sample (biopsy) may be taken to rule out other conditions that may resemble epidermal cysts. TREATMENT   Epidermal cysts often get better and disappear on their own. They are rarely ever cancerous.  If a cyst becomes infected, it may become inflamed and tender. This may require opening and draining the cyst. Treatment with antibiotics may be necessary. When the infection is gone, the cyst may be removed with minor surgery.  Small, inflamed cysts can often be treated with antibiotics or by injecting steroid medicines.  Sometimes, epidermal cysts become large and bothersome. If this happens, surgical removal in your caregiver's office may be necessary. HOME CARE INSTRUCTIONS  Only take over-the-counter or prescription medicines as directed by your caregiver.  Take your antibiotics as directed. Finish them even if you start to feel  better. SEEK MEDICAL CARE IF:   Your cyst becomes tender, red, or swollen.  Your condition is not improving or is getting worse.  You have any other questions or concerns. MAKE SURE YOU:  Understand these instructions.  Will watch your condition.  Will get help right away if you are not doing well or get worse. Document Released: 10/28/2004 Document Revised: 02/19/2012 Document Reviewed: 06/05/2011 ExitCare Patient Information 2015 [employer]. This information is not intended to replace advice given to you by your health care provider. Make sure you discuss any questions you have with your health care provider.  

## 2015-06-25 NOTE — Progress Notes (Signed)
Subjective:  Patient ID: Gloria Rodriguez, female    DOB: 08/10/1976  Age: 39 y.o. MRN: [id number]  CC: Mass   HPI Haru C Gasparro presents  with a small "knot" on the right posterior neck. She said she recently discovered that it's not tender freely mobile. She has no fever chills no nasal congestion postnasal drainage or sore throat. No redness  History Mazey has a past medical history of Headache(784.0); HSV infection; Postpartum care and examination of lactating mother (07/13/2011); Hypotension; Chronic kidney disease; Sleep apnea; Anxiety; No pertinent past medical history; Depression; GERD (gastroesophageal reflux disease); and Sleep disorder (03/03/2014).   She has past surgical history that includes Cesarean section; Cesarean section (07/11/2011); LASIK; Cholecystectomy (11/14/2011); and TEE without cardioversion (N/A, 11/18/2013).   Her  family history includes Heart disease in her maternal grandmother.  She   reports that she quit smoking about 7 years ago. Her smoking use included Cigarettes. She has a .375 pack-year smoking history. She has never used smokeless tobacco. She reports that she drinks about 0.6 oz of alcohol per week. She reports that she uses illicit drugs (Other-see comments and Marijuana).  Outpatient Prescriptions Prior to Visit  Medication Sig Dispense Refill  . ALPRAZolam (XANAX) 0.25 MG tablet Take 0.25 mg by mouth 2 (two) times daily as needed for anxiety.    Marland Kitchen amphetamine-dextroamphetamine (ADDERALL XR) 25 MG 24 hr capsule Take 25 mg by mouth every morning.    Marland Kitchen atenolol (TENORMIN) 25 MG tablet Take 25 mg by mouth 2 (two) times daily.     . cetirizine (ZYRTEC) 10 MG tablet Take 10 mg by mouth 2 (two) times daily.     Marland Kitchen esomeprazole (NEXIUM) 40 MG capsule Take 40 mg by mouth daily at 12 noon.    . lamoTRIgine (LAMICTAL) 150 MG tablet Take 150 mg by mouth daily.    Marland Kitchen levonorgestrel (MIRENA) 20 MCG/24HR IUD 1 each by Intrauterine route once.    . naproxen (NAPROSYN)  500 MG tablet Take 1 tablet by mouth as needed.    . pramipexole (MIRAPEX) 1 MG tablet Take 1 mg by mouth at bedtime.    . promethazine (PHENERGAN) 25 MG tablet Take 25 mg by mouth every 6 (six) hours as needed for nausea or vomiting.    . sertraline (ZOLOFT) 100 MG tablet Take 100 mg by mouth every morning.     . ciprofloxacin (CIPRO) 500 MG tablet Take 1 tablet (500 mg total) by mouth 2 (two) times daily. (Patient not taking: Reported on 06/25/2015) 14 tablet 0  . clomiPRAMINE (ANAFRANIL) 50 MG capsule Take 75 mg by mouth at bedtime.     . dihydroergotamine (MIGRANAL) 4 MG/ML nasal spray Place 1 spray into the nose as needed.     . metroNIDAZOLE (FLAGYL) 500 MG tablet Take 1 tablet (500 mg total) by mouth 2 (two) times daily. Take 1 pill twice daily for one week. NO alcohol (Patient not taking: Reported on 06/25/2015) 14 tablet 0  . rizatriptan (MAXALT) 10 MG tablet Take 10 mg by mouth as needed for migraine. May repeat in 2 hours if needed    . valACYclovir (VALTREX) 500 MG tablet Take 500 mg by mouth 2 (two) times daily as needed. OUTBREAK     No facility-administered medications prior to visit.    History   Social History  . Marital Status: Married    Spouse Name: N/A  . Number of Children: N/A  . Years of Education: N/A   Social History  Main Topics  . Smoking status: Former Smoker -- 0.25 packs/day for 1.5 years    Types: Cigarettes    Quit date: 07/31/2007  . Smokeless tobacco: Never Used  . Alcohol Use: 0.6 oz/week    1 Cans of beer per week     Comment: socially before pregnancy  . Drug Use: Yes    Special: Other-see comments, Marijuana     Comment: in college  . Sexual Activity: Yes   Other Topics Concern  . None   Social History Narrative     Review of Systems  Constitutional: Negative for fever, chills and appetite change.  HENT: Negative for congestion, ear pain, postnasal drip, sinus pressure and sore throat.   Eyes: Negative for pain and redness.    Respiratory: Negative for cough, shortness of breath and wheezing.   Cardiovascular: Negative for leg swelling.  Gastrointestinal: Negative for nausea, vomiting, abdominal pain, diarrhea, constipation and blood in stool.  Endocrine: Negative for polyuria.  Genitourinary: Negative for dysuria, urgency, frequency and flank pain.  Musculoskeletal: Negative for gait problem.  Skin: Negative for rash.  Neurological: Negative for weakness and headaches.  Psychiatric/Behavioral: Negative for confusion and decreased concentration. The patient is not nervous/anxious.     Objective:  BP 112/72 mmHg  Pulse 99  Temp(Src) 98.6 F (37 C) (Oral)  Resp 18  Ht 5\' 6"  (1.676 m)  Wt 251 lb (113.853 kg)  BMI 40.53 kg/m2  SpO2 97%  Physical Exam  Constitutional: She is oriented to person, place, and time. She appears well-developed and well-nourished.  HENT:  Head: Normocephalic and atraumatic.  Eyes: Conjunctivae are normal. Pupils are equal, round, and reactive to light.  Pulmonary/Chest: Effort normal.  Musculoskeletal: She exhibits no edema.  Neurological: She is alert and oriented to person, place, and time.  Skin: Skin is dry. Rash noted. Rash is papular.  Psychiatric: She has a normal mood and affect. Her behavior is normal. Thought content normal.   She has a small pea-sized sebaceous cyst it's on the right posterior neck.   Assessment & Plan:   Cyd was seen today for mass.  Diagnoses and all orders for this visit:  Sebaceous cyst   I am having Ms. Lassen maintain her sertraline, valACYclovir, cetirizine, lamoTRIgine, esomeprazole, clomiPRAMINE, amphetamine-dextroamphetamine, rizatriptan, promethazine, ALPRAZolam, naproxen, pramipexole, atenolol, dihydroergotamine, levonorgestrel, ciprofloxacin, and metroNIDAZOLE.  No orders of the defined types were placed in this encounter.   We discussed excision of the mass was not really firmly in favor of it has a benign lesion is not  likely to have any consequence.  Appropriate red flag conditions were discussed with the patient as well as actions that should be taken.  Patient expressed his understanding.  Follow-up: Return if symptoms worsen or fail to improve.  Roselee Culver, MD

## 2015-09-01 ENCOUNTER — Ambulatory Visit: Payer: BC Managed Care – PPO | Admitting: Family Medicine

## 2015-12-30 ENCOUNTER — Encounter: Payer: Self-pay | Admitting: Obstetrics & Gynecology

## 2016-04-26 DIAGNOSIS — Z0271 Encounter for disability determination: Secondary | ICD-10-CM

## 2016-07-07 DIAGNOSIS — Z0271 Encounter for disability determination: Secondary | ICD-10-CM

## 2017-04-12 ENCOUNTER — Encounter: Payer: BC Managed Care – PPO | Admitting: Obstetrics & Gynecology

## 2017-04-13 ENCOUNTER — Ambulatory Visit (INDEPENDENT_AMBULATORY_CARE_PROVIDER_SITE_OTHER): Payer: BC Managed Care – PPO | Admitting: Obstetrics & Gynecology

## 2017-04-13 ENCOUNTER — Encounter: Payer: Self-pay | Admitting: Obstetrics & Gynecology

## 2017-04-13 VITALS — BP 122/80 | Ht 65.0 in | Wt 245.0 lb

## 2017-04-13 DIAGNOSIS — A6004 Herpesviral vulvovaginitis: Secondary | ICD-10-CM | POA: Diagnosis not present

## 2017-04-13 DIAGNOSIS — R300 Dysuria: Secondary | ICD-10-CM

## 2017-04-13 DIAGNOSIS — Z1151 Encounter for screening for human papillomavirus (HPV): Secondary | ICD-10-CM

## 2017-04-13 DIAGNOSIS — N898 Other specified noninflammatory disorders of vagina: Secondary | ICD-10-CM | POA: Diagnosis not present

## 2017-04-13 DIAGNOSIS — Z01419 Encounter for gynecological examination (general) (routine) without abnormal findings: Secondary | ICD-10-CM | POA: Diagnosis not present

## 2017-04-13 DIAGNOSIS — Z30431 Encounter for routine checking of intrauterine contraceptive device: Secondary | ICD-10-CM | POA: Diagnosis not present

## 2017-04-13 LAB — URINALYSIS W MICROSCOPIC + REFLEX CULTURE
BILIRUBIN URINE: NEGATIVE
CRYSTALS: NONE SEEN [HPF]
Casts: NONE SEEN [LPF]
GLUCOSE, UA: NEGATIVE
LEUKOCYTES UA: NEGATIVE
Nitrite: POSITIVE — AB
PH: 6.5 (ref 5.0–8.0)
SPECIFIC GRAVITY, URINE: 1.02 (ref 1.001–1.035)
Yeast: NONE SEEN [HPF]

## 2017-04-13 LAB — WET PREP FOR TRICH, YEAST, CLUE
Clue Cells Wet Prep HPF POC: NONE SEEN
TRICH WET PREP: NONE SEEN
Yeast Wet Prep HPF POC: NONE SEEN

## 2017-04-13 MED ORDER — SULFAMETHOXAZOLE-TRIMETHOPRIM 800-160 MG PO TABS
1.0000 | ORAL_TABLET | Freq: Two times a day (BID) | ORAL | 0 refills | Status: AC
Start: 2017-04-13 — End: 2017-04-16

## 2017-04-13 MED ORDER — NITROFURANTOIN MONOHYD MACRO 100 MG PO CAPS: 100.0000 mg | ORAL_CAPSULE | ORAL | 1 refills | Status: DC | PRN

## 2017-04-13 MED ORDER — VALACYCLOVIR HCL 1 G PO TABS: 1000.0000 mg | ORAL_TABLET | Freq: Every day | ORAL | 2 refills | Status: AC

## 2017-04-13 NOTE — Addendum Note (Signed)
Addended by: Thurnell Garbe A on: 04/13/2017 03:43 PM   Modules accepted: Orders

## 2017-04-13 NOTE — Progress Notes (Signed)
Gloria Rodriguez 03-18-1976 [id number]   History:    41 y.o. Married.  G2P2  Oldest daughter 82 yo.  Established patient for annual gyn exam   Will obtain Med Records.  Mirena IUD x about 2+ yrs.  Menses light normal.  No pelvic pain.  Mild increase in Vaginal d/c.  No itching.  Mild pain with miction.  Frequent Cystitis post IC.  Breasts wnl.   Past medical history,surgical history, family history and social history were all reviewed and documented in the EPIC chart.  Gynecologic History No LMP recorded. Patient is not currently having periods (Reason: IUD). Contraception: IUD Last Pap: 2017. Results were: normal Last mammogram: 2017. Results were: normal  Obstetric History OB History  Gravida Para Term Preterm AB Living  2 2 2  0 0 2  SAB TAB Ectopic Multiple Live Births  0 0 0 0 2    # Outcome Date GA Lbr Len/2nd Weight Sex Delivery Anes PTL Lv  2 Term 07/11/11 [redacted]w[redacted]d  5 lb 3.4 oz (2.365 kg) F CS-LTranv EPI  LIV  1 Term 01/2001 [redacted]w[redacted]d  7 lb 13 oz (3.544 kg) F CS-LTranv Spinal N LIV     Birth Comments: c/s for HSV outbreak       ROS: A ROS was performed and pertinent positives and negatives are included in the history.  GENERAL: No fevers or chills. HEENT: No change in vision, no earache, sore throat or sinus congestion. NECK: No pain or stiffness. CARDIOVASCULAR: No chest pain or pressure. No palpitations. PULMONARY: No shortness of breath, cough or wheeze. GASTROINTESTINAL: No abdominal pain, nausea, vomiting or diarrhea, melena or bright red blood per rectum. GENITOURINARY: No urinary frequency, urgency, hesitancy or dysuria. MUSCULOSKELETAL: No joint or muscle pain, no back pain, no recent trauma. DERMATOLOGIC: No rash, no itching, no lesions. ENDOCRINE: No polyuria, polydipsia, no heat or cold intolerance. No recent change in weight. HEMATOLOGICAL: No anemia or easy bruising or bleeding. NEUROLOGIC: No headache, seizures, numbness, tingling or weakness. PSYCHIATRIC: No depression,  no loss of interest in normal activity or change in sleep pattern.     Exam:   BP 122/80   Ht 5\' 5"  (1.651 m)   Wt 245 lb (111.1 kg)   BMI 40.77 kg/m   Body mass index is 40.77 kg/m.  General appearance : Well developed well nourished female. No acute distress HEENT: Eyes: no retinal hemorrhage or exudates,  Neck supple, trachea midline, no carotid bruits, no thyroidmegaly Lungs: Clear to auscultation, no rhonchi or wheezes, or rib retractions  Heart: Regular rate and rhythm, no murmurs or gallops Breast:Examined in sitting and supine position were symmetrical in appearance, no palpable masses or tenderness,  no skin retraction, no nipple inversion, no nipple discharge, no skin discoloration, no axillary or supraclavicular lymphadenopathy Abdomen: no palpable masses or tenderness, no rebound or guarding Extremities: no edema or skin discoloration or tenderness  Pelvic:  Bartholin, Urethra, Skene Glands: Within normal limits             Vagina: No gross lesions or discharge  Cervix: No gross lesions or discharge.  Pap/HPV done.  Wet prep.  Uterus  AV, normal size, shape and consistency, non-tender and mobile  Adnexa  Without masses or tenderness  Anus and perineum  normal   Assessment/Plan:  41 y.o. female for annual exam  1. Encounter for routine gynecological examination with Papanicolaou smear of cervix Normal Gyn exam.  Pap/HPV HR done.  Schedule screening Mammo when due in 2018.  2. Encounter for routine checking of intrauterine contraceptive device (IUD) Mirena IUD in good position, well tolerated.    3. Morbid obesity (HCC) BMI 40.8 today.  Per patient lost 9 Lbs in last month on low Carb diet.  Encouraged to continue.  Add regular Physical activity, walking qday discussed.  4. Vaginal discharge Wet prep neg. - WET PREP FOR [place], YEAST, CLUE  5. Dysuria U/A Nit pos.  Rx with Bactrim, prescription sent.  Will prevent Cystitis with MacroBID prior to IC.   Prescription sent.  6. Herpes simplex vulvovaginitis About 5 recurrences per year.  Prefers treating each episode.  Valacyclovir prescription sent.  Counseling on above issues >50% x 15 minutes.   Princess Bruins MD, 2:27 PM 04/13/2017

## 2017-04-13 NOTE — Addendum Note (Signed)
Addended by: Thurnell Garbe A on: 04/13/2017 03:49 PM   Modules accepted: Orders

## 2017-04-13 NOTE — Patient Instructions (Signed)
1. Encounter for routine gynecological examination with Papanicolaou smear of cervix Normal Gyn exam.  Pap/HPV HR done.  Schedule screening Mammo when due in 2018.  2. Encounter for routine checking of intrauterine contraceptive device (IUD) Mirena IUD in good position, well tolerated.    3. Morbid obesity (HCC) BMI 40.8 today.  Per patient lost 9 Lbs in last month on low Carb diet.  Encouraged to continue.  Add regular Physical activity, walking qday discussed.  4. Vaginal discharge Wet prep neg. - WET PREP FOR [place], YEAST, CLUE  5. Dysuria U/A Nit pos.  Rx with Bactrim, prescription sent.  Will prevent Cystitis with MacroBID prior to IC.  Prescription sent.  6. Herpes simplex vulvovaginitis About 5 recurrences per year.  Prefers treating each episode.  Valacyclovir prescription sent.  Driana, it was a pleasure to see you today!  I will let you know your results as soon as available.  Happy Birthday!

## 2017-04-15 LAB — URINE CULTURE

## 2017-04-16 LAB — PAP, TP IMAGING W/ HPV RNA, RFLX HPV TYPE 16,18/45: HPV MRNA, HIGH RISK: NOT DETECTED

## 2017-06-18 ENCOUNTER — Telehealth: Payer: Self-pay | Admitting: *Deleted

## 2017-06-18 NOTE — Telephone Encounter (Signed)
Pt called c/o possible UTI pressure, has IC as well hard to tell the difference, take couple pills of Macrobid 100 mg that she has on had for after intercourse. I explained OV for urine best to confirm, pt said she feels better, not 100% will watch symptoms and schedule OV if no improve.

## 2017-07-24 ENCOUNTER — Ambulatory Visit
Admission: RE | Admit: 2017-07-24 | Discharge: 2017-07-24 | Disposition: A | Discharge status: Home / Self Care | Payer: BC Managed Care – PPO | Source: Ambulatory Visit | Attending: Family Medicine | Admitting: Family Medicine

## 2017-07-24 ENCOUNTER — Other Ambulatory Visit: Payer: Self-pay | Admitting: Family Medicine

## 2017-07-24 DIAGNOSIS — Z77090 Contact with and (suspected) exposure to asbestos: Secondary | ICD-10-CM

## 2018-03-20 ENCOUNTER — Other Ambulatory Visit: Payer: BC Managed Care – PPO

## 2018-03-20 ENCOUNTER — Ambulatory Visit
Admission: RE | Admit: 2018-03-20 | Discharge: 2018-03-20 | Disposition: A | Discharge status: Home / Self Care | Payer: BC Managed Care – PPO | Source: Ambulatory Visit | Attending: Family Medicine | Admitting: Family Medicine

## 2018-03-20 ENCOUNTER — Other Ambulatory Visit: Payer: Self-pay | Admitting: Family Medicine

## 2018-03-20 DIAGNOSIS — R52 Pain, unspecified: Secondary | ICD-10-CM

## 2018-04-08 ENCOUNTER — Ambulatory Visit
Admission: RE | Admit: 2018-04-08 | Discharge: 2018-04-08 | Disposition: A | Discharge status: Home / Self Care | Payer: BC Managed Care – PPO | Source: Ambulatory Visit | Attending: Family Medicine | Admitting: Family Medicine

## 2018-07-23 DIAGNOSIS — K529 Noninfective gastroenteritis and colitis, unspecified: Secondary | ICD-10-CM | POA: Diagnosis not present

## 2018-07-25 ENCOUNTER — Encounter: Payer: Self-pay | Admitting: Physician Assistant

## 2018-07-25 DIAGNOSIS — R110 Nausea: Secondary | ICD-10-CM | POA: Diagnosis not present

## 2018-07-25 DIAGNOSIS — M542 Cervicalgia: Secondary | ICD-10-CM | POA: Diagnosis not present

## 2018-07-25 DIAGNOSIS — M791 Myalgia, unspecified site: Secondary | ICD-10-CM | POA: Diagnosis not present

## 2018-07-25 DIAGNOSIS — F319 Bipolar disorder, unspecified: Secondary | ICD-10-CM | POA: Diagnosis not present

## 2018-07-25 DIAGNOSIS — R531 Weakness: Secondary | ICD-10-CM | POA: Diagnosis not present

## 2018-07-25 DIAGNOSIS — R69 Illness, unspecified: Secondary | ICD-10-CM | POA: Diagnosis not present

## 2018-07-25 DIAGNOSIS — G43719 Chronic migraine without aura, intractable, without status migrainosus: Secondary | ICD-10-CM | POA: Diagnosis not present

## 2018-07-25 DIAGNOSIS — G518 Other disorders of facial nerve: Secondary | ICD-10-CM | POA: Diagnosis not present

## 2018-07-25 DIAGNOSIS — K529 Noninfective gastroenteritis and colitis, unspecified: Secondary | ICD-10-CM | POA: Diagnosis not present

## 2018-07-25 DIAGNOSIS — G43909 Migraine, unspecified, not intractable, without status migrainosus: Secondary | ICD-10-CM | POA: Diagnosis not present

## 2018-08-14 ENCOUNTER — Encounter: Payer: Self-pay | Admitting: Physician Assistant

## 2018-08-15 ENCOUNTER — Ambulatory Visit: Payer: BC Managed Care – PPO | Admitting: Physician Assistant

## 2018-08-20 DIAGNOSIS — G5600 Carpal tunnel syndrome, unspecified upper limb: Secondary | ICD-10-CM | POA: Diagnosis not present

## 2018-08-20 DIAGNOSIS — G629 Polyneuropathy, unspecified: Secondary | ICD-10-CM | POA: Diagnosis not present

## 2018-08-21 DIAGNOSIS — R0602 Shortness of breath: Secondary | ICD-10-CM | POA: Diagnosis not present

## 2018-08-21 DIAGNOSIS — E669 Obesity, unspecified: Secondary | ICD-10-CM | POA: Diagnosis not present

## 2018-08-21 DIAGNOSIS — M25012 Hemarthrosis, left shoulder: Secondary | ICD-10-CM | POA: Diagnosis not present

## 2018-08-23 ENCOUNTER — Other Ambulatory Visit: Payer: Self-pay | Admitting: Family Medicine

## 2018-08-23 ENCOUNTER — Ambulatory Visit
Admission: RE | Admit: 2018-08-23 | Discharge: 2018-08-23 | Disposition: A | Discharge status: Home / Self Care | Payer: Medicare HMO | Source: Ambulatory Visit | Attending: Family Medicine | Admitting: Family Medicine

## 2018-08-23 DIAGNOSIS — R0602 Shortness of breath: Secondary | ICD-10-CM

## 2018-08-23 DIAGNOSIS — R0789 Other chest pain: Secondary | ICD-10-CM | POA: Diagnosis not present

## 2018-09-03 ENCOUNTER — Other Ambulatory Visit: Payer: Self-pay | Admitting: Family Medicine

## 2018-09-03 DIAGNOSIS — Z77090 Contact with and (suspected) exposure to asbestos: Secondary | ICD-10-CM

## 2018-09-03 DIAGNOSIS — M25512 Pain in left shoulder: Secondary | ICD-10-CM

## 2018-09-04 ENCOUNTER — Encounter (INDEPENDENT_AMBULATORY_CARE_PROVIDER_SITE_OTHER): Payer: Self-pay

## 2018-09-04 ENCOUNTER — Ambulatory Visit: Payer: Medicare HMO | Admitting: Physician Assistant

## 2018-09-04 ENCOUNTER — Encounter: Payer: Self-pay | Admitting: Physician Assistant

## 2018-09-04 VITALS — BP 96/70 | HR 96 | Ht 65.0 in | Wt 264.0 lb

## 2018-09-04 DIAGNOSIS — K9089 Other intestinal malabsorption: Secondary | ICD-10-CM

## 2018-09-04 DIAGNOSIS — M791 Myalgia, unspecified site: Secondary | ICD-10-CM | POA: Diagnosis not present

## 2018-09-04 DIAGNOSIS — R197 Diarrhea, unspecified: Secondary | ICD-10-CM

## 2018-09-04 DIAGNOSIS — G518 Other disorders of facial nerve: Secondary | ICD-10-CM | POA: Diagnosis not present

## 2018-09-04 DIAGNOSIS — G43719 Chronic migraine without aura, intractable, without status migrainosus: Secondary | ICD-10-CM | POA: Diagnosis not present

## 2018-09-04 DIAGNOSIS — K219 Gastro-esophageal reflux disease without esophagitis: Secondary | ICD-10-CM | POA: Diagnosis not present

## 2018-09-04 DIAGNOSIS — M542 Cervicalgia: Secondary | ICD-10-CM | POA: Diagnosis not present

## 2018-09-04 MED ORDER — HYOSCYAMINE SULFATE SL 0.125 MG SL SUBL: SUBLINGUAL_TABLET | SUBLINGUAL | 6 refills | Status: DC

## 2018-09-04 MED ORDER — CHOLESTYRAMINE 4 G PO PACK: PACK | ORAL | 4 refills | Status: DC

## 2018-09-04 MED ORDER — ESOMEPRAZOLE MAGNESIUM 40 MG PO CPDR: DELAYED_RELEASE_CAPSULE | ORAL | 11 refills | Status: DC

## 2018-09-04 NOTE — Progress Notes (Signed)
Subjective:    Patient ID: Gloria Rodriguez, female    DOB: [birth year], 42 y.o.   MRN: [id number]  HPI Gloria Rodriguez is a pleasant 42 year old white female, new to GI today referred by Dr. Claris Gower for evaluation of persistent diarrhea and chronic GERD. Patient has history of chronic daily migraines, she status post cholecystectomy in 2012, has history of obesity, interstitial cystitis, anxiety and depression. She believes she had remote colonoscopy done somewhere in [place] prior to 2007 for rectal bleeding was found only to have internal hemorrhoids. She reports that she has had diarrhea ever since her gallbladder was removed in 2012.  He also had increase in acid reflux since that time.  She has been on Nexium 40 mg daily over the past 7 years. She says long as she takes the Nexium she feels fine but if she misses even one dose she will have heartburn and indigestion.  She has no complaints of dysphasia or odynophagia. She is generally having 1-2 loose bowel movements per day which are manageable however if she eats a meal higher in fat or oil she will have episodes of urgent diarrhea which can last for the remainder of the day.  On She also feels she had a recent gastroenteritis which lasted for about 7 days and was miserable. Never taken anything for her chronic diarrhea. Family history is negative for colon cancer and IBD.  Review of Systems;Pertinent positive and negative review of systems were noted in the above HPI section.  All other review of systems was otherwise negative.  Outpatient Encounter Medications as of 09/04/2018  Medication Sig  . ALPRAZolam (XANAX) 0.25 MG tablet Take 0.25 mg by mouth 2 (two) times daily as needed for anxiety.  Marland Kitchen amphetamine-dextroamphetamine (ADDERALL) 10 MG tablet Take 10 mg by mouth daily with breakfast.  . cetirizine (ZYRTEC) 10 MG tablet Take 10 mg by mouth 2 (two) times daily.   . clomiPRAMINE (ANAFRANIL) 50 MG capsule Take 75 mg by mouth at bedtime.     Marland Kitchen esomeprazole (NEXIUM) 40 MG capsule Take 1 capsule by mouth every morning.  . lamoTRIgine (LAMICTAL) 150 MG tablet Take 150 mg by mouth daily.  Marland Kitchen levETIRAcetam (KEPPRA) 250 MG tablet Take 250 mg by mouth 2 (two) times daily.  Marland Kitchen levonorgestrel (MIRENA) 20 MCG/24HR IUD 1 each by Intrauterine route once.  . naproxen (NAPROSYN) 500 MG tablet Take 1 tablet by mouth as needed.  . nitrofurantoin, macrocrystal-monohydrate, (MACROBID) 100 MG capsule Take 1 capsule (100 mg total) by mouth as needed. Prior to Intercourse  . pramipexole (MIRAPEX) 1 MG tablet Take 1 mg by mouth at bedtime.  . promethazine (PHENERGAN) 25 MG tablet Take 25 mg by mouth every 6 (six) hours as needed for nausea or vomiting.  . sertraline (ZOLOFT) 100 MG tablet Take 100 mg by mouth every morning.   . [DISCONTINUED] esomeprazole (NEXIUM) 40 MG capsule Take 40 mg by mouth daily at 12 noon.  . cholestyramine (QUESTRAN) 4 g packet Take 1/2 to 1 pack in juice or water daily as needed for diarrhea. Take at least 2 hours away from other medications.  . Hyoscyamine Sulfate SL (LEVSIN/SL) 0.125 MG SUBL Dissolve 1 tablet on the tongue every 6 hours as needed for cramping, diarrhea.   No facility-administered encounter medications on file as of 09/04/2018.    Allergies  Allergen Reactions  . Other Itching and Other (See Comments)    TREE NUT.Marland KitchenMOUTH SORES  . Codeine Other (See Comments)    migraines  .  Hydrocodone Other (See Comments)    migraines  . Oxycodone Other (See Comments)    migraines  . Ultram [Tramadol Hcl] Other (See Comments)    Migraines/nightmares/ STATES ANY SODIUM BINDING TABLET CAUSES ALLERGIC REACTION  . Vicodin [Hydrocodone-Acetaminophen] Other (See Comments)    migraines   Patient Active Problem List   Diagnosis Date Noted  . Migraine 12/28/2014  . Family history of Hashimoto thyroiditis 04/27/2014  . Sleep disorder 03/03/2014   Social History   Socioeconomic History  . Marital status: Married     Spouse name: Not on file  . Number of children: Not on file  . Years of education: Not on file  . Highest education level: Not on file  Occupational History  . Not on file  Social Needs  . Financial resource strain: Not on file  . Food insecurity:    Worry: Not on file    Inability: Not on file  . Transportation needs:    Medical: Not on file    Non-medical: Not on file  Tobacco Use  . Smoking status: Former Smoker    Packs/day: 0.25    Years: 1.50    Pack years: 0.37    Types: Cigarettes    Last attempt to quit: 07/31/2007    Years since quitting: 11.1  . Smokeless tobacco: Never Used  Substance and Sexual Activity  . Alcohol use: Yes    Alcohol/week: 1.0 standard drinks    Types: 1 Cans of beer per week    Comment: socially   . Drug use: Yes    Types: Other-see comments, Marijuana    Comment: in college  . Sexual activity: Yes    Birth control/protection: IUD  Lifestyle  . Physical activity:    Days per week: Not on file    Minutes per session: Not on file  . Stress: Not on file  Relationships  . Social connections:    Talks on phone: Not on file    Gets together: Not on file    Attends religious service: Not on file    Active member of club or organization: Not on file    Attends meetings of clubs or organizations: Not on file    Relationship status: Not on file  . Intimate partner violence:    Fear of current or ex partner: Not on file    Emotionally abused: Not on file    Physically abused: Not on file    Forced sexual activity: Not on file  Other Topics Concern  . Not on file  Social History Narrative  . Not on file    Gloria Rodriguez family history includes Heart disease in her maternal grandmother; Hypertension in her mother; Prostate cancer in her maternal grandfather.      Objective:    Vitals:   09/04/18 0908  BP: 96/70  Pulse: 96    Physical Exam; well-developed white female in no acute distress, pleasant blood pressure 96/70 pulse 96,  height 5 foot 5, weight 264, BMI 43.9 and HEENT; nontraumatic normocephalic EOMI PERRLA sclera anicteric oropharynx moist, Cardiovascular; regular rate and rhythm with S1-S2 no murmur rub or gallop, Pulmonary; clear bilaterally, Abdomen; obese, soft nontender nondistended bowel sounds are active no palpable mass or hepatosplenomegaly, Rectal; exam not done, Extremities; no clubbing cyanosis or edema skin warm dry, Neuro psych ;alert and oriented, grossly nonfocal mood and affect appropriate       Assessment & Plan:   #59 42 year old white female with chronic GERD, requiring daily PPI  over the past 7 years, stable on Nexium Rule out Barrett's  #2 chronic diarrhea, present since cholecystectomy in 2012 and aggravated by fatty meals Diarrhea is most consistent with bile salt induced diarrhea.  #3 obesity #4.  Interstitial cystitis #5.  Anxiety/depression #6.  Chronic migraine  Plan; Continue Nexium 40 mg p.o. every morning.  We reviewed a strict antireflux regimen including n.p.o. for 3 hours prior to bedtime and elevation of the head of the bed. EGD should be considered to rule out Barrett's.  We discussed this today.  She has an upcoming MRI and has been having problems with her shoulder.  She prefers to hold on the EGD in the short-term. Will start Levsin sublingual dissolve one on the tongue every 4 to 6 hours as needed for urgency/diarrhea. Start Questran 4 g 1/2 to 1 pack daily as needed to be taken at least 2 hours away from other meds.  She says she is very sensitive to antidiarrheals and does not want to get constipated. Patient will be established with [name] and I have asked her to follow-up with [name] in 6 to 8 weeks.  I am also is happy to see her at any point in follow-up.  Rudine Rieger S Nyx Keady PA-C 09/04/2018   Cc: Ferd Hibbs, NP

## 2018-09-04 NOTE — Patient Instructions (Signed)
We have sent the following medications to your pharmacy for you to pick up at your convenience: [place] 1. Nexium 40 mg 2. Levsin SL tablets 3. Questran powder  Follow up with Dr. [name] on 10-15-2018 at 1:30 PM.   Normal BMI (Body Mass Index- based on height and weight) is between 19 and 25. Your BMI today is Body mass index is 43.93 kg/m. Marland Kitchen Please consider follow up  regarding your BMI with your Primary Care Provider.

## 2018-09-05 NOTE — Progress Notes (Signed)
Reviewed note. Agree.

## 2018-09-12 ENCOUNTER — Ambulatory Visit
Admission: RE | Admit: 2018-09-12 | Discharge: 2018-09-12 | Disposition: A | Discharge status: Home / Self Care | Payer: Medicare HMO | Source: Ambulatory Visit | Attending: Family Medicine | Admitting: Family Medicine

## 2018-09-12 DIAGNOSIS — M19012 Primary osteoarthritis, left shoulder: Secondary | ICD-10-CM | POA: Diagnosis not present

## 2018-09-12 DIAGNOSIS — R0602 Shortness of breath: Secondary | ICD-10-CM | POA: Diagnosis not present

## 2018-09-12 DIAGNOSIS — Z77090 Contact with and (suspected) exposure to asbestos: Secondary | ICD-10-CM

## 2018-09-12 DIAGNOSIS — M25512 Pain in left shoulder: Secondary | ICD-10-CM

## 2018-09-17 DIAGNOSIS — M25512 Pain in left shoulder: Secondary | ICD-10-CM | POA: Diagnosis not present

## 2018-09-19 ENCOUNTER — Encounter: Payer: Self-pay | Admitting: Internal Medicine

## 2018-09-19 ENCOUNTER — Ambulatory Visit (INDEPENDENT_AMBULATORY_CARE_PROVIDER_SITE_OTHER): Payer: Medicare HMO | Admitting: Internal Medicine

## 2018-09-19 VITALS — BP 116/80 | HR 100 | Ht 64.25 in | Wt 264.0 lb

## 2018-09-19 DIAGNOSIS — M25512 Pain in left shoulder: Secondary | ICD-10-CM | POA: Diagnosis not present

## 2018-09-19 DIAGNOSIS — M25612 Stiffness of left shoulder, not elsewhere classified: Secondary | ICD-10-CM | POA: Diagnosis not present

## 2018-09-19 DIAGNOSIS — M6281 Muscle weakness (generalized): Secondary | ICD-10-CM | POA: Diagnosis not present

## 2018-09-19 DIAGNOSIS — R0609 Other forms of dyspnea: Secondary | ICD-10-CM

## 2018-09-19 DIAGNOSIS — S46012D Strain of muscle(s) and tendon(s) of the rotator cuff of left shoulder, subsequent encounter: Secondary | ICD-10-CM | POA: Diagnosis not present

## 2018-09-19 MED ORDER — PANTOPRAZOLE SODIUM 40 MG PO TBEC: DELAYED_RELEASE_TABLET | ORAL | 2 refills | Status: DC

## 2018-09-19 NOTE — Patient Instructions (Addendum)
Pantoprazole (protonix) 40 mg   Take 30- 60 min before your first and last meals of the day  - this is the best way to tell whether stomach acid is contributing to your problem.     GERD (REFLUX)  is an extremely common cause of respiratory symptoms just like yours , many times with no obvious heartburn at all.    It can be treated with medication, but also with lifestyle changes including elevation of the head of your bed (ideally with 6 inch  bed blocks),  Smoking cessation, avoidance of late meals, excessive alcohol, and avoid fatty foods, chocolate, peppermint, colas, red wine, and acidic juices such as orange juice.  NO MINT OR MENTHOL PRODUCTS SO NO COUGH DROPS   USE SUGARLESS CANDY INSTEAD (Jolley ranchers or Stover's or Life Savers) or even ice chips will also do - the key is to swallow to prevent all throat clearing. NO OIL BASED VITAMINS - use powdered substitutes.       To get the most out of exercise, you need to be continuously aware that you are short of breath, but never out of breath, building  Up to 30 minutes daily. As you improve, it will actually be easier for you to do the same amount of exercise  in  30 minutes so always push to the level where you are short of breath.     Please schedule a follow up office visit in 4 weeks, sooner if needed - bring your inhaler with you on return

## 2018-09-19 NOTE — Progress Notes (Signed)
Gloria Rodriguez, female    DOB: 1976-09-01,    MRN: [id number]     Brief patient profile:  19 yowf  quit smoking 07/2007 bothered by rhinitis spring > fall only better while at [facility] by Ardyth Harps / then [facility] allergist "all trees and grasses"  Took allegra / zyrtec daily gradually better then around 2017 noted onset of doe so referred to pulmonary clinic 09/19/2018 by Dr   Arelia Sneddon.   Baseline wt around 203 p last IUP (tol well) wt =12/18/11 then  5/4/ 2018= 245      History of Present Illness  09/19/2018  Pulmonary/ 1st office eval   Chief Complaint  Patient presents with  . Pulmonary Consult    Referred by Dr. Claris Gower. Pt c/o SOB for the past 1-2 yrs. She states she gets SOB walking from room to room at home.   Dyspnea: indolent onset/  gradually  Worse x 2 y to point of room to room - sometimes at rest just a few breaths with transient sense of chest tight/  Ex symptoms worse with heat/ anxiety  Cough: no much Sleep: on side/ flat bed / one pillow SABA use: not sure helps/ rarely uses heavy taking ppi hs only = otc nexium    No obvious day to day or daytime variability or assoc excess/ purulent sputum or mucus plugs or hemoptysis or cp  Or  subjective wheeze or overt sinus or hb symptoms.   Sleeping as above  without nocturnal  or early am exacerbation  of respiratory  c/o's or need for noct saba. Also denies any obvious fluctuation of symptoms with weather or environmental changes or other aggravating or alleviating factors except as outlined above   No unusual exposure hx or h/o childhood pna/ asthma or knowledge of premature birth.  Current Allergies, Complete Past Medical History, Past Surgical History, Family History, and Social History were reviewed in [employer] record.  ROS  The following are not active complaints unless bolded Hoarseness, sore throat, dysphagia, dental problems, itching, sneezing,  nasal congestion or discharge of excess mucus or  purulent secretions, ear ache,   fever, chills, sweats, unintended wt loss or wt gain, classically pleuritic or exertional cp,  orthopnea pnd or arm/hand swelling  or leg swelling, presyncope, palpitations, abdominal pain, anorexia, nausea, vomiting, diarrhea  or change in bowel habits or change in bladder habits, change in stools or change in urine, dysuria, hematuria,  rash, arthralgias, visual complaints, headache, numbness, weakness or ataxia or problems with walking or coordination,  change in mood or  memory.             Past Medical History:  Diagnosis Date  . Anxiety    hx bipolar, ADHD  . Chronic kidney disease    kindney stones, interstitial cystitis history  . Depression    states well controlled  . GERD (gastroesophageal reflux disease)    hx gallstones and pancreatitis per pt  . Headache(784.0)   . HSV infection    on Valtrex suppression  . Hypotension    normal rens 80/60  . No pertinent past medical history    states with gall bladder attacks with chest pain, vomiting and diarrhes    EKG  in [facility]  . Postpartum care and examination of lactating mother 07/13/2011  . Sleep apnea    NO SLEEP APNEA BUT STATES HAS PERIODIC LIMB MOVEMENT DISORDER_ WAS IN MIRAPEX PRE PREGNANCY  . Sleep disorder 03/03/2014  Outpatient Medications Prior to Visit  Medication Sig Dispense Refill  . albuterol (VENTOLIN HFA) 108 (90 Base) MCG/ACT inhaler Inhale 2 puffs into the lungs every 6 (six) hours as needed for wheezing or shortness of breath.    . ALPRAZolam (XANAX) 0.25 MG tablet Take 0.25 mg by mouth 2 (two) times daily as needed for anxiety.    Marland Kitchen amphetamine-dextroamphetamine (ADDERALL XR) 30 MG 24 hr capsule Take 30 mg by mouth daily.    Marland Kitchen amphetamine-dextroamphetamine (ADDERALL) 10 MG tablet Take 10 mg by mouth daily with breakfast.    . baclofen (LIORESAL) 10 MG tablet Take 10 mg by mouth 3 (three) times daily as needed for muscle spasms.    . cetirizine (ZYRTEC) 10 MG tablet Take 10  mg by mouth 2 (two) times daily.     . cholestyramine (QUESTRAN) 4 g packet Take 1/2 to 1 pack in juice or water daily as needed for diarrhea. Take at least 2 hours away from other medications. 30 each 4  . clomiPRAMINE (ANAFRANIL) 50 MG capsule Take 75 mg by mouth at bedtime.     Marland Kitchen esomeprazole (NEXIUM) 40 MG capsule Take 1 capsule by mouth every morning. 30 capsule 11  . Hyoscyamine Sulfate SL (LEVSIN/SL) 0.125 MG SUBL Dissolve 1 tablet on the tongue every 6 hours as needed for cramping, diarrhea. 60 each 6  . lamoTRIgine (LAMICTAL) 150 MG tablet Take 150 mg by mouth daily.    Marland Kitchen levonorgestrel (MIRENA) 20 MCG/24HR IUD 1 each by Intrauterine route once.    . naproxen (NAPROSYN) 500 MG tablet Take 1 tablet by mouth as needed.    . nitrofurantoin, macrocrystal-monohydrate, (MACROBID) 100 MG capsule Take 1 capsule (100 mg total) by mouth as needed. Prior to Intercourse 30 capsule 1  . pramipexole (MIRAPEX) 1 MG tablet Take 1 mg by mouth at bedtime.    . promethazine (PHENERGAN) 25 MG tablet Take 25 mg by mouth every 6 (six) hours as needed for nausea or vomiting.    . sertraline (ZOLOFT) 100 MG tablet Take 100 mg by mouth every morning.     . zonisamide (ZONEGRAN) 100 MG capsule Take 2 capsules by mouth daily.  1  . levETIRAcetam (KEPPRA) 250 MG tablet Take 250 mg by mouth 2 (two) times daily.     No facility-administered medications prior to visit.            Objective:     BP 116/80 (BP Location: Left Arm, Cuff Size: Normal)   Pulse 100   Ht 5' 4.25" (1.632 m)   Wt 264 lb (119.7 kg)   SpO2 97%   BMI 44.96 kg/m   SpO2: 97 % RA   Pleasant obese wf nad   HEENT: nl dentition, turbinates bilaterally, and oropharynx. Nl external ear canals without cough reflex   NECK :  without JVD/Nodes/TM/ nl carotid upstrokes bilaterally   LUNGS: no acc muscle use,  Nl contour chest which is clear to A and P bilaterally without cough on insp or exp maneuvers   CV:  RRR  no s3 or murmur or  increase in P2, and no edema   ABD:  Obese soft and nontender with limited inspiratory excursion in the supine position. No bruits or organomegaly appreciated, bowel sounds nl  MS:  Nl gait/ ext warm without deformities, calf tenderness, cyanosis or clubbing No obvious joint restrictions   SKIN: warm and dry without lesions    NEURO:  alert, approp, nl sensorium with  no motor or  cerebellar deficits apparent.      I personally reviewed images and agree with radiology impression as follows:   Chest HRCT   09/13/18 1. No evidence of asbestos related pleural disease or interstitial lung disease. 2. Air trapping is indicative of small airways disease.       Assessment   DOE (dyspnea on exertion) Chest HRCT   09/13/18 1. No evidence of asbestos related pleural disease or interstitial lung disease. 2. Air trapping is indicative of small airways disease. -  09/19/2018   Walked RA x one lap @ 185 stopped due to Sob with sats 98% at fast pace  -  Spirometry 09/19/2018  FEV1 3.2 (106%)  Ratio 86 s any curvature s prior rx   -  09/19/2018  rec max rx for gerd/ rtc 4 weeks    Symptoms are markedly disproportionate to objective findings and not clear to what extent this is actually a pulmonary  problem but pt does appear to have difficult to sort out respiratory symptoms of unknown origin for which  DDX  = almost all start with A and  include Adherence, Ace Inhibitors, Acid Reflux, Active Sinus Disease, Alpha 1 Antitripsin deficiency, Anxiety masquerading as Airways dz,  ABPA,  Allergy(esp in young), Aspiration (esp in elderly), Adverse effects of meds,  Active smoking or Vaping, A bunch of PE's/clot burden (a few small clots can't cause this syndrome unless there is already severe underlying pulm or vascular dz with poor reserve),  Anemia or thyroid disorder, plus two Bs  = Bronchiectasis and Beta blocker use..and one C= CHF     Adherence is always the initial "prime suspect" and is a  multilayered concern that requires a "trust but verify" approach in every patient - starting with knowing how to use medications, especially inhalers, correctly, keeping up with refills and understanding the fundamental difference between maintenance and prns vs those medications only taken for a very short course and then stopped and not refilled.  - return in 4 weeks with all meds in hand using a trust but verify approach to confirm accurate Medication  Reconciliation The principal here is that until we are certain that the  patients are doing what we've asked, it makes no sense to ask them to do more.   ? Acid (or non-acid) GERD > always difficult to exclude as up to 75% of pts in some series report no assoc GI/ Heartburn symptoms and this might explain some of her atypical chest tighness which is not directly related to ex > rec max (24h)  acid suppression and diet restrictions/ reviewed and instructions given in writing.   ? Anxiety/wt gain/ deconditioning  > usually at the bottom of this list of usual suspects but should be   higher on this pt's based on H and P and note already on psychotropics and may interfere with adherence and also interpretation of response or lack thereof to symptom management which can be quite subjective.   ? Allergy / asthma > lack of variability or noct symptoms or cough rule against this though note remote hx or poorly controlled rhinitis does put her at risk and nl pfts don't exclude the dx- ideally need cpst to sort out with spirometry before and after    ? Anemia/ thyroid dz > she tells me this was all checked out recently by Dr Arelia Sneddon   ? Adverse drug effects > concerned about use of macrodantin in this setting but she only uses rarely prn post coital  cystitis and no ILD on hrct is reassuring in this setting so ok to continue for now   ? A bunch of PE's > at risk due to wt, min risk assoc with Merena IUD noted, but chronicity (steady decline ex tol  as wt  increased)  and ex sats today do not support this dx    F/u in 4 weeks to regroup p reconditioning     Morbid obesity due to excess calories (HCC) Body mass index is 44.96 kg/m.    Lab Results  Component Value Date   TSH 2.623 12/28/2014     Contributing to gerd risk/ doe/reviewed the need and the process to achieve and maintain neg calorie balance > defer f/u primary care including intermittently monitoring thyroid status       Total time devoted to counseling  > 50 % of initial 60 min office visit:  review case with pt/husband discussion of options/alternatives/ personally creating written customized instructions  in presence of pt  then going over those specific  Instructions directly with the pt including how to use all of the meds but in particular covering each new medication in detail and the difference between the maintenance= "automatic" meds and the prns using an action plan format for the latter (If this problem/symptom => do that organization reading Left to right).  Please see AVS from this visit for a full list of these instructions which I personally wrote for this pt and  are unique to this visit.      Christinia Gully, MD 09/19/2018

## 2018-09-20 ENCOUNTER — Encounter: Payer: Self-pay | Admitting: Internal Medicine

## 2018-09-20 DIAGNOSIS — E669 Obesity, unspecified: Secondary | ICD-10-CM | POA: Insufficient documentation

## 2018-09-20 NOTE — Assessment & Plan Note (Signed)
Body mass index is 44.96 kg/m.   Lab Results  Component Value Date   TSH 2.623 12/28/2014     Contributing to gerd risk/ doe/reviewed the need and the process to achieve and maintain neg calorie balance > defer f/u primary care including intermittently monitoring thyroid status       Total time devoted to counseling  > 50 % of initial 60 min office visit:  review case with pt/husband discussion of options/alternatives/ personally creating written customized instructions  in presence of pt  then going over those specific  Instructions directly with the pt including how to use all of the meds but in particular covering each new medication in detail and the difference between the maintenance= "automatic" meds and the prns using an action plan format for the latter (If this problem/symptom => do that organization reading Left to right).  Please see AVS from this visit for a full list of these instructions which I personally wrote for this pt and  are unique to this visit.

## 2018-09-20 NOTE — Assessment & Plan Note (Addendum)
Chest HRCT   09/13/18 1. No evidence of asbestos related pleural disease or interstitial lung disease. 2. Air trapping is indicative of small airways disease. -  09/19/2018   Walked RA x one lap @ 185 stopped due to Sob with sats 98% at fast pace  -  Spirometry 09/19/2018  FEV1 3.2 (106%)  Ratio 86 s any curvature s prior rx   -  09/19/2018  rec max rx for gerd/ rtc 4 weeks    Symptoms are markedly disproportionate to objective findings and not clear to what extent this is actually a pulmonary  problem but pt does appear to have difficult to sort out respiratory symptoms of unknown origin for which  DDX  = almost all start with A and  include Adherence, Ace Inhibitors, Acid Reflux, Active Sinus Disease, Alpha 1 Antitripsin deficiency, Anxiety masquerading as Airways dz,  ABPA,  Allergy(esp in young), Aspiration (esp in elderly), Adverse effects of meds,  Active smoking or Vaping, A bunch of PE's/clot burden (a few small clots can't cause this syndrome unless there is already severe underlying pulm or vascular dz with poor reserve),  Anemia or thyroid disorder, plus two Bs  = Bronchiectasis and Beta blocker use..and one C= CHF     Adherence is always the initial "prime suspect" and is a multilayered concern that requires a "trust but verify" approach in every patient - starting with knowing how to use medications, especially inhalers, correctly, keeping up with refills and understanding the fundamental difference between maintenance and prns vs those medications only taken for a very short course and then stopped and not refilled.  - return in 4 weeks with all meds in hand using a trust but verify approach to confirm accurate Medication  Reconciliation The principal here is that until we are certain that the  patients are doing what we've asked, it makes no sense to ask them to do more.   ? Acid (or non-acid) GERD > always difficult to exclude as up to 75% of pts in some series report no assoc GI/  Heartburn symptoms and this might explain some of her atypical chest tighness which is not directly related to ex > rec max (24h)  acid suppression and diet restrictions/ reviewed and instructions given in writing.   ? Anxiety/wt gain/ deconditioning  > usually at the bottom of this list of usual suspects but should be   higher on this pt's based on H and P and note already on psychotropics and may interfere with adherence and also interpretation of response or lack thereof to symptom management which can be quite subjective.   ? Allergy / asthma > lack of variability or noct symptoms or cough rule against this though note remote hx or poorly controlled rhinitis does put her at risk and nl pfts don't exclude the dx- ideally need cpst to sort out with spirometry before and after    ? Anemia/ thyroid dz > she tells me this was all checked out recently by Dr Arelia Sneddon   ? Adverse drug effects > concerned about use of macrodantin in this setting but she only uses rarely prn post coital cystitis and no ILD on hrct is reassuring in this setting so ok to continue for now   ? A bunch of PE's > at risk due to wt, min risk assoc with Merena IUD noted, but chronicity (steady decline ex tol  as wt increased)  and ex sats today do not support this dx  F/u in 4 weeks to regroup p reconditioning

## 2018-09-24 DIAGNOSIS — M25512 Pain in left shoulder: Secondary | ICD-10-CM | POA: Diagnosis not present

## 2018-10-04 DIAGNOSIS — M6281 Muscle weakness (generalized): Secondary | ICD-10-CM | POA: Diagnosis not present

## 2018-10-04 DIAGNOSIS — M25512 Pain in left shoulder: Secondary | ICD-10-CM | POA: Diagnosis not present

## 2018-10-04 DIAGNOSIS — M25612 Stiffness of left shoulder, not elsewhere classified: Secondary | ICD-10-CM | POA: Diagnosis not present

## 2018-10-15 ENCOUNTER — Ambulatory Visit: Payer: Medicare HMO | Admitting: Gastroenterology

## 2018-10-17 ENCOUNTER — Encounter: Payer: Self-pay | Admitting: Gastroenterology

## 2018-10-17 ENCOUNTER — Ambulatory Visit: Payer: Medicare HMO | Admitting: Gastroenterology

## 2018-10-17 VITALS — BP 112/70 | HR 97 | Resp 16 | Ht 65.0 in | Wt 260.4 lb

## 2018-10-17 DIAGNOSIS — K219 Gastro-esophageal reflux disease without esophagitis: Secondary | ICD-10-CM | POA: Diagnosis not present

## 2018-10-17 DIAGNOSIS — R197 Diarrhea, unspecified: Secondary | ICD-10-CM

## 2018-10-17 NOTE — Progress Notes (Signed)
k  Referring Provider: Leonard Downing, * Primary Care Physician:  Leonard Downing, MD   Reason for Consultation:  Diarrhea and GERD   IMPRESSION:  GERD    - previously on Nexium 40 mg daily x 7 years    - controlled on Protonix diagnosed by Dr. Melvyn Novas Diarrhea aggravated by fatty meals    - developed after cholecystectomy    - gets constipated with antidiarrheal agents Cholecystectomy for cholelithiasis 2012 Obesity with BMI of 43 Internal hemorrhoids on colononoscopy for bleeding prior to 2007 No family history of colon cancer or polyps  Clinically improved on pantoprazole. Will continue 40 mg daily. Although, bile acid diarrhea remains high on the differential. Reattempt to get insurance coverage for cholestyramine or cholestipol if diarrhea worsens.   PLAN: Continue Protonix Reviewed lifestyle modifications Brochure on GERD diet provided She is encouraged to work to maintain a healthy weight Referral to [facility] (Dr. Dennard Nip) at the patient's request Return to this clinic in 3 months or earlier as needed   HPI: Gloria Rodriguez is a 42 y.o. female returns in follow-up after her initial consultation appointment 09/04/18. This is my first appointment with the patient. Interval history obtained through review of her electronic health record including a recent consultation note with Dr. Melvyn Novas.  Recently disabled due to migraines. She has interstitial cystitis. 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   Was unable to obtain Questran due to her insurance plan. Otherwise it was $400. She had to pay $100 for the Levsin SL. She has used them three times since she was last year.   Referral to Dr. Melvyn Novas for possible COPD. He told her that she has reflux and not COPD. He prescribed pantoprazole 40 mg daily. The diarrhea, "runniness," urgency, and cramping has actually improved with pantroprazole. Her shortness of breath has improved and energy has  improved. She does not feel that additional treatment or evaluation is needed at this time.   She is very interested in weight loss and following a diet that's helpful. She wonders if weight loss might otherwise improve her symptoms.   Past Medical History:  Diagnosis Date  . Anxiety    hx bipolar, ADHD  . Chronic kidney disease    kindney stones, interstitial cystitis history  . Depression    states well controlled  . GERD (gastroesophageal reflux disease)    hx gallstones and pancreatitis per pt  . Headache(784.0)   . HSV infection    on Valtrex suppression  . Hypotension    normal rens 80/60  . No pertinent past medical history    states with gall bladder attacks with chest pain, vomiting and diarrhes    EKG  in EPIC  . Postpartum care and examination of lactating mother 07/13/2011  . Sleep apnea    NO SLEEP APNEA BUT STATES HAS PERIODIC LIMB MOVEMENT DISORDER_ WAS IN MIRAPEX PRE PREGNANCY  . Sleep disorder 03/03/2014    Past Surgical History:  Procedure Laterality Date  . CESAREAN SECTION    . CESAREAN SECTION  07/11/2011   Procedure: CESAREAN SECTION;  Surgeon: Princess Bruins;  Location: [facility];  Service: Gynecology;  Laterality: N/A;  Repeat  . CHOLECYSTECTOMY  11/14/2011   Procedure: LAPAROSCOPIC CHOLECYSTECTOMY WITH INTRAOPERATIVE CHOLANGIOGRAM;  Surgeon: Joyice Faster. Cornett, MD;  Location: [facility];  Service: General;  Laterality: N/A;  Laparoscopic Cholecystectomy with cholangiogram, c-arm  . LASIK     2007  . TEE WITHOUT CARDIOVERSION N/A 11/18/2013  Procedure: TRANSESOPHAGEAL ECHOCARDIOGRAM (TEE);  Surgeon: Laverda Page, MD;  Location: [facility];  Service: Cardiovascular;  Laterality: N/A;    Current Outpatient Medications  Medication Sig Dispense Refill  . albuterol (VENTOLIN HFA) 108 (90 Base) MCG/ACT inhaler Inhale 2 puffs into the lungs every 6 (six) hours as needed for wheezing or shortness of breath.    . ALPRAZolam (XANAX) 0.25 MG tablet Take 0.25 mg by  mouth 2 (two) times daily as needed for anxiety.    Marland Kitchen amphetamine-dextroamphetamine (ADDERALL XR) 30 MG 24 hr capsule Take 30 mg by mouth daily.    Marland Kitchen amphetamine-dextroamphetamine (ADDERALL) 10 MG tablet Take 10 mg by mouth daily with breakfast.    . baclofen (LIORESAL) 10 MG tablet Take 10 mg by mouth 3 (three) times daily as needed for muscle spasms.    . cetirizine (ZYRTEC) 10 MG tablet Take 10 mg by mouth 2 (two) times daily.     . cholestyramine (QUESTRAN) 4 g packet Take 1/2 to 1 pack in juice or water daily as needed for diarrhea. Take at least 2 hours away from other medications. 30 each 4  . clomiPRAMINE (ANAFRANIL) 50 MG capsule Take 75 mg by mouth at bedtime.     Marland Kitchen Hyoscyamine Sulfate SL (LEVSIN/SL) 0.125 MG SUBL Dissolve 1 tablet on the tongue every 6 hours as needed for cramping, diarrhea. 60 each 6  . lamoTRIgine (LAMICTAL) 150 MG tablet Take 150 mg by mouth daily.    Marland Kitchen levonorgestrel (MIRENA) 20 MCG/24HR IUD 1 each by Intrauterine route once.    . naproxen (NAPROSYN) 500 MG tablet Take 1 tablet by mouth as needed.    . nitrofurantoin, macrocrystal-monohydrate, (MACROBID) 100 MG capsule Take 1 capsule (100 mg total) by mouth as needed. Prior to Intercourse 30 capsule 1  . pantoprazole (PROTONIX) 40 MG tablet Take 30- 60 min before your first and last meals of the day 60 tablet 2  . pramipexole (MIRAPEX) 1 MG tablet Take 1 mg by mouth at bedtime.    . promethazine (PHENERGAN) 25 MG tablet Take 25 mg by mouth every 6 (six) hours as needed for nausea or vomiting.    . sertraline (ZOLOFT) 100 MG tablet Take 100 mg by mouth every morning.     . zonisamide (ZONEGRAN) 100 MG capsule Take 2 capsules by mouth daily.  1   No current facility-administered medications for this visit.     Allergies as of 10/17/2018 - Review Complete 09/20/2018  Allergen Reaction Noted  . Other Itching and Other (See Comments) 11/01/2011  . Codeine Other (See Comments) 07/11/2011  . Hydrocodone Other (See  Comments) 07/11/2011  . Oxycodone Other (See Comments) 07/11/2011  . Ultram [tramadol hcl] Other (See Comments) 07/11/2011  . Vicodin [hydrocodone-acetaminophen] Other (See Comments) 07/11/2011    Family History  Problem Relation Age of Onset  . Hypertension Mother   . Heart disease Maternal Grandmother   . Prostate cancer Maternal Grandfather   . Stomach cancer Neg Hx   . Rectal cancer Neg Hx     Social History   Socioeconomic History  . Marital status: Married    Spouse name: Not on file  . Number of children: Not on file  . Years of education: Not on file  . Highest education level: Not on file  Occupational History  . Not on file  Social Needs  . Financial resource strain: Not on file  . Food insecurity:    Worry: Not on file    Inability: Not  on file  . Transportation needs:    Medical: Not on file    Non-medical: Not on file  Tobacco Use  . Smoking status: Former Smoker    Packs/day: 0.25    Years: 1.50    Pack years: 0.37    Types: Cigarettes    Last attempt to quit: 07/31/2007    Years since quitting: 11.2  . Smokeless tobacco: Never Used  Substance and Sexual Activity  . Alcohol use: Yes    Alcohol/week: 1.0 standard drinks    Types: 1 Cans of beer per week    Comment: socially   . Drug use: Yes    Types: Other-see comments, Marijuana    Comment: in college  . Sexual activity: Yes    Birth control/protection: IUD  Lifestyle  . Physical activity:    Days per week: Not on file    Minutes per session: Not on file  . Stress: Not on file  Relationships  . Social connections:    Talks on phone: Not on file    Gets together: Not on file    Attends religious service: Not on file    Active member of club or organization: Not on file    Attends meetings of clubs or organizations: Not on file    Relationship status: Not on file  . Intimate partner violence:    Fear of current or ex partner: Not on file    Emotionally abused: Not on file    Physically  abused: Not on file    Forced sexual activity: Not on file  Other Topics Concern  . Not on file  Social History Narrative  . Not on file    Physical Exam: Vital signs were reviewed. General:   Alert, well-nourished, pleasant and cooperative in NAD Lungs:  Clear throughout to auscultation.   No wheezes. Heart:  Regular rate and rhythm; no murmurs Abdomen:  Soft, nontender, normal bowel sounds. No rebound or guarding. No hepatosplenomegaly Rectal:  Deferred  Skin:  No rash or bruise. Normal nail beds. Psych:  Alert and cooperative. Normal mood and affect.   Kayelyn Lemon L. Tarri Glenn Md, MPH [facility] 10/17/2018, 10:06 AM

## 2018-10-17 NOTE — Patient Instructions (Addendum)
If you are age 43 or older, your body mass index should be between 23-30. Your Body mass index is 43.33 kg/m. If this is out of the aforementioned range listed, please consider follow up with your Primary Care Provider.  If you are age 33 or younger, your body mass index should be between 19-25. Your Body mass index is 43.33 kg/m. If this is out of the aformentioned range listed, please consider follow up with your Primary Care Provider.   - Continue to take pantroprazole 40 mg daily (30-60 minutes before breakfast) - Please call if the diarrhea returns so that we can try to get your cholestyramine approved - Work to obtain a healthy weight - Brochure on GERD diet provided - Continue with [facility] as you are weight for the referral to the [facility]  - I will refer you to Dr. Leafy Ro.  We will contact you with an appointment.  - Return in 3 months or earlier as needed.  The schedule is not available at this time.  Please contact the office in a month to set up a follow up appointment.  Thank you for choosing me and [facility].   [name], MD

## 2018-10-22 ENCOUNTER — Ambulatory Visit: Payer: Medicare HMO | Admitting: Internal Medicine

## 2018-10-22 DIAGNOSIS — M791 Myalgia, unspecified site: Secondary | ICD-10-CM | POA: Diagnosis not present

## 2018-10-22 DIAGNOSIS — G518 Other disorders of facial nerve: Secondary | ICD-10-CM | POA: Diagnosis not present

## 2018-10-22 DIAGNOSIS — R51 Headache: Secondary | ICD-10-CM | POA: Diagnosis not present

## 2018-10-22 DIAGNOSIS — G43719 Chronic migraine without aura, intractable, without status migrainosus: Secondary | ICD-10-CM | POA: Diagnosis not present

## 2018-10-22 DIAGNOSIS — M542 Cervicalgia: Secondary | ICD-10-CM | POA: Diagnosis not present

## 2018-10-23 ENCOUNTER — Ambulatory Visit: Payer: Medicare HMO | Admitting: Internal Medicine

## 2018-10-29 ENCOUNTER — Encounter: Payer: Self-pay | Admitting: Internal Medicine

## 2018-10-29 ENCOUNTER — Ambulatory Visit: Payer: Medicare HMO | Admitting: Internal Medicine

## 2018-10-29 VITALS — BP 130/82 | HR 102 | Ht 64.25 in | Wt 259.8 lb

## 2018-10-29 DIAGNOSIS — R0609 Other forms of dyspnea: Secondary | ICD-10-CM | POA: Diagnosis not present

## 2018-10-29 NOTE — Assessment & Plan Note (Signed)
Chest HRCT   09/13/18 1. No evidence of asbestos related pleural disease or interstitial lung disease. 2. Air trapping is indicative of small airways disease. -  09/19/2018   Walked RA x one lap @ 185 stopped due to Sob with sats 98% at fast pace  -  Spirometry 09/19/2018  FEV1 3.2 (106%)  Ratio 86 s any curvature s prior rx   -  09/19/2018  rec max rx for gerd/ rtc 4 weeks > much improved doe   Clearly improved with aggressive rx for gerd despite absence of wt loss   I had an extended final summary discussion with the patient reviewing all relevant studies completed to date and  lasting 15 to 20 minutes of a 25 minute visit on the following issues:     the  ramp to expected improvement in symptoms from an empiric trial of PPI (and for that matter, worsening, if a chronic effective medication is stopped)  can be measured in weeks, not days, a common misconception because this is not the same as treating heartburn (no immediate cause and effect relationship)  so that response to therapy or lack thereof can be very difficult to assess especially if the patient is not adherent to the treatment plan which includes dietary restrictions.   rec 3 m ppi bid then one month ppi ac and h2 hs then maintain on h2 bid and f/u with pcp/GI prn flare as taper.

## 2018-10-29 NOTE — Progress Notes (Signed)
Gloria Rodriguez, female    DOB: 10-17-1976,    MRN: [id number]     Brief patient profile:  19 yowf  quit smoking 07/2007 bothered by rhinitis spring > fall only better while at [facility] by Ardyth Harps / then [facility] allergist "all trees and grasses"  Took allegra / zyrtec daily gradually better then around 2017 noted onset of doe so referred to pulmonary clinic 09/19/2018 by Dr   Arelia Sneddon.   Baseline wt around 203 p last IUP (tol well) wt =12/18/11 then  5/4/ 2018= 245      History of Present Illness  09/19/2018  Pulmonary/ 1st office eval   Chief Complaint  Patient presents with  . Pulmonary Consult    Referred by Dr. Claris Gower. Pt c/o SOB for the past 1-2 yrs. She states she gets SOB walking from room to room at home.   Dyspnea: indolent onset/  gradually  Worse x 2 y to point of room to room - sometimes at rest just a few breaths with transient sense of chest tight/  Ex symptoms worse with heat/ anxiety  Cough: no much Sleep: on side/ flat bed / one pillow SABA use: not sure helps/ rarely uses  taking ppi hs only = otc nexium  rec Pantoprazole (protonix) 40 mg   Take 30- 60 min before your first and last meals of the day  - this is the best way to tell whether stomach acid is contributing to your problem.   GERD diet  To get the most out of exercise, you need to be continuously aware that you are short of breath, but never out of breath, building  Up to 30 minutes daily.  Please schedule a follow up office visit in 4 weeks, sooner if needed - bring your inhaler with you on return     10/29/2018  f/u ov/Li Bobo re:  Doe improved on gerd rx/ diet  Chief Complaint  Patient presents with  . Follow-up  Dyspnea:  Shopping fine - no aerobics   Cough: no Sleeping: on side/ bed flat  SABA use: none now 02: none    No obvious day to day or daytime variability or assoc excess/ purulent sputum or mucus plugs or hemoptysis or cp or chest tightness, subjective wheeze or overt sinus or hb  symptoms.   Sleeping as above  without nocturnal  or early am exacerbation  of respiratory  c/o's or need for noct saba. Also denies any obvious fluctuation of symptoms with weather or environmental changes or other aggravating or alleviating factors except as outlined above   No unusual exposure hx or h/o childhood pna/ asthma or knowledge of premature birth.  Current Allergies, Complete Past Medical History, Past Surgical History, Family History, and Social History were reviewed in [employer] record.  ROS  The following are not active complaints unless bolded Hoarseness, sore throat, dysphagia, dental problems, itching, sneezing,  nasal congestion or discharge of excess mucus or purulent secretions, ear ache,   fever, chills, sweats, unintended wt loss or wt gain, classically pleuritic or exertional cp,  orthopnea pnd or arm/hand swelling  or leg swelling, presyncope, palpitations, abdominal pain, anorexia, nausea, vomiting, diarrhea  or change in bowel habits or change in bladder habits, change in stools or change in urine, dysuria, hematuria,  rash, arthralgias, visual complaints, headache, numbness, weakness or ataxia or problems with walking or coordination,  change in mood or  memory.        Current  Meds  Medication Sig  . albuterol (VENTOLIN HFA) 108 (90 Base) MCG/ACT inhaler Inhale 2 puffs into the lungs every 6 (six) hours as needed for wheezing or shortness of breath.  . ALPRAZolam (XANAX) 0.25 MG tablet Take 0.25 mg by mouth 2 (two) times daily as needed for anxiety.  Marland Kitchen amphetamine-dextroamphetamine (ADDERALL XR) 30 MG 24 hr capsule Take 30 mg by mouth daily with breakfast.   . amphetamine-dextroamphetamine (ADDERALL) 10 MG tablet Take 10 mg by mouth 2 (two) times daily.   . cetirizine (ZYRTEC) 10 MG tablet Take 10 mg by mouth 2 (two) times daily.   . clomiPRAMINE (ANAFRANIL) 75 MG capsule Take 150 mg by mouth at bedtime.  Marland Kitchen Hyoscyamine Sulfate SL (LEVSIN/SL)  0.125 MG SUBL Dissolve 1 tablet on the tongue every 6 hours as needed for cramping, diarrhea.  . lamoTRIgine (LAMICTAL) 150 MG tablet Take 150 mg by mouth daily.  Marland Kitchen levonorgestrel (MIRENA) 20 MCG/24HR IUD 1 each by Intrauterine route once.  . methocarbamol (ROBAXIN) 500 MG tablet TAKE 1 TABLET TWICE A DAY AS NEEDED, LIMIT 1-2 TREATMENTS PER WEEK  . naproxen (NAPROSYN) 500 MG tablet Take 1 tablet by mouth as needed (migraine headaches).   . nitrofurantoin, macrocrystal-monohydrate, (MACROBID) 100 MG capsule Take 1 capsule (100 mg total) by mouth as needed. Prior to Intercourse  . pantoprazole (PROTONIX) 40 MG tablet Take 30- 60 min before your first and last meals of the day  . pramipexole (MIRAPEX) 1 MG tablet Take 1 mg by mouth at bedtime.  . promethazine (PHENERGAN) 25 MG tablet Take 25 mg by mouth every 6 (six) hours as needed for nausea or vomiting (for migraine headaches).   . sertraline (ZOLOFT) 100 MG tablet Take 100 mg by mouth every morning.   . zonisamide (ZONEGRAN) 100 MG capsule Take 2 capsules by mouth daily.             Objective:    amb obese wf nad - much brighter affect, in fact = all smiles    10/29/2018     259     10/29/18 259 lb 12.8 oz (117.8 kg)  10/17/18 260 lb 6.4 oz (118.1 kg)  09/19/18 264 lb (119.7 kg)     Vital signs reviewed - Note on arrival 02 sats  99% on RA       HEENT: nl dentition, turbinates bilaterally, and oropharynx. Nl external ear canals without cough reflex   NECK :  without JVD/Nodes/TM/ nl carotid upstrokes bilaterally   LUNGS: no acc muscle use,  Nl contour chest which is clear to A and P bilaterally without cough on insp or exp maneuvers   CV:  RRR  no s3 or murmur or increase in P2, and no edema   ABD:  Obese/ soft and nontender with nl inspiratory excursion in the supine position. No bruits or organomegaly appreciated, bowel sounds nl  MS:  Nl gait/ ext warm without deformities, calf tenderness, cyanosis or clubbing No  obvious joint restrictions   SKIN: warm and dry without lesions    NEURO:  alert, approp, nl sensorium with  no motor or cerebellar deficits apparent.         Assessment

## 2018-10-29 NOTE — Patient Instructions (Addendum)
Continue your pantoprazole 40 mg Take 30- 60 min before your first and last meals of the day x 2 more months then try pepcid 20 mg after supper or at bedtime and for a month and then change to pepcid 20 mg after bfast and supper and follow up with Dr Arelia Sneddon or Beever if you flare.    If you are satisfied with your treatment plan,  let your doctor know and he/she can either refill your medications or you can return here when your prescription runs out.     If in any way you are not 100% satisfied,  please tell us.  If 100% better, tell your friends!  Pulmonary follow up is as needed

## 2018-10-29 NOTE — Assessment & Plan Note (Signed)
Body mass index is 44.25 kg/m.  -  Trending no change  Lab Results  Component Value Date   TSH 2.623 12/28/2014     Contributing to gerd risk/ doe/reviewed the need and the process to achieve and maintain neg calorie balance > defer f/u primary care including intermittently monitoring thyroid status

## 2018-11-23 DIAGNOSIS — K219 Gastro-esophageal reflux disease without esophagitis: Secondary | ICD-10-CM | POA: Diagnosis not present

## 2018-11-23 DIAGNOSIS — G43909 Migraine, unspecified, not intractable, without status migrainosus: Secondary | ICD-10-CM | POA: Diagnosis not present

## 2018-11-23 DIAGNOSIS — F429 Obsessive-compulsive disorder, unspecified: Secondary | ICD-10-CM | POA: Diagnosis not present

## 2018-11-23 DIAGNOSIS — F419 Anxiety disorder, unspecified: Secondary | ICD-10-CM | POA: Diagnosis not present

## 2018-11-23 DIAGNOSIS — Z6841 Body Mass Index (BMI) 40.0 and over, adult: Secondary | ICD-10-CM | POA: Diagnosis not present

## 2018-11-23 DIAGNOSIS — B009 Herpesviral infection, unspecified: Secondary | ICD-10-CM | POA: Diagnosis not present

## 2018-11-23 DIAGNOSIS — F909 Attention-deficit hyperactivity disorder, unspecified type: Secondary | ICD-10-CM | POA: Diagnosis not present

## 2018-11-23 DIAGNOSIS — R69 Illness, unspecified: Secondary | ICD-10-CM | POA: Diagnosis not present

## 2018-11-23 DIAGNOSIS — F331 Major depressive disorder, recurrent, moderate: Secondary | ICD-10-CM | POA: Diagnosis not present

## 2018-12-12 ENCOUNTER — Telehealth: Payer: Self-pay

## 2018-12-12 MED ORDER — PANTOPRAZOLE SODIUM 40 MG PO TBEC: DELAYED_RELEASE_TABLET | ORAL | 2 refills | Status: DC

## 2018-12-12 NOTE — Telephone Encounter (Signed)
Spoke with patient per [name] last note she is to continue taking Protonix. rx sent to pharmacy and instructed patient to keep her follow up.

## 2018-12-12 NOTE — Telephone Encounter (Signed)
Pt is wanting a refill ? Protonix that was filled by her pulmonologist.  She has a follow up appt 01/16/19. thanks

## 2018-12-16 NOTE — Telephone Encounter (Signed)
Erroneous encounter

## 2018-12-18 ENCOUNTER — Telehealth: Payer: Self-pay | Admitting: Gastroenterology

## 2018-12-18 MED ORDER — COLESTIPOL HCL 1 G PO TABS: 1.0000 g | ORAL_TABLET | Freq: Every day | ORAL | 1 refills | Status: DC

## 2018-12-18 NOTE — Telephone Encounter (Signed)
Clarification with [name] Colestipol 1 gm daily  Patient notified of the results and recommendations.

## 2018-12-18 NOTE — Telephone Encounter (Signed)
I would prefer cholestipol if insurance allows. Otherwise, cholestyramine. Thank you.

## 2018-12-18 NOTE — Telephone Encounter (Signed)
Pt states that she has been having diarrhea again, it reocurred before christmas and has not stop, she needs some advise on what to do

## 2018-12-18 NOTE — Telephone Encounter (Signed)
Patient's diarrhea has returned.  Did you want to prescribe cholestyramine as indicated in the last office note?

## 2018-12-26 ENCOUNTER — Telehealth: Payer: Self-pay | Admitting: Gastroenterology

## 2018-12-26 ENCOUNTER — Encounter (INDEPENDENT_AMBULATORY_CARE_PROVIDER_SITE_OTHER): Payer: Medicare HMO

## 2018-12-26 NOTE — Telephone Encounter (Signed)
Patient reports that she is having increased diarrhea.  Much worse after she eats fatty or meals with larger amounts of protein.  She did start the colestipol 1 gm daily last week initially helped, but now essentially the same as she was prior to starting.  Multiple diarrhea episodes a day.  [name] please advise

## 2018-12-26 NOTE — Telephone Encounter (Signed)
Patient notified of the new recommendations.  She will call me next week and will send in a new rx based on how many she is taking a day,  She verbalized understanding.

## 2018-12-26 NOTE — Telephone Encounter (Signed)
Pt has been having diarrhea after she eats anything, she needs some advise.

## 2018-12-26 NOTE — Telephone Encounter (Signed)
That is a very small dose. We have room to titrate with the hope that she will have more relief. She may increase the Colestipol to 1 gram BID. If no improvement after 5 days, may increase to 2 grams BID. Thanks.

## 2019-01-01 ENCOUNTER — Encounter (INDEPENDENT_AMBULATORY_CARE_PROVIDER_SITE_OTHER): Payer: Self-pay | Admitting: Family Medicine

## 2019-01-01 ENCOUNTER — Ambulatory Visit (INDEPENDENT_AMBULATORY_CARE_PROVIDER_SITE_OTHER): Payer: Medicare HMO | Admitting: Family Medicine

## 2019-01-01 VITALS — BP 111/77 | HR 94 | Temp 98.0°F | Ht 65.0 in | Wt 256.0 lb

## 2019-01-01 DIAGNOSIS — G43809 Other migraine, not intractable, without status migrainosus: Secondary | ICD-10-CM | POA: Diagnosis not present

## 2019-01-01 DIAGNOSIS — Z6841 Body Mass Index (BMI) 40.0 and over, adult: Secondary | ICD-10-CM | POA: Diagnosis not present

## 2019-01-01 DIAGNOSIS — R0602 Shortness of breath: Secondary | ICD-10-CM | POA: Diagnosis not present

## 2019-01-01 DIAGNOSIS — E559 Vitamin D deficiency, unspecified: Secondary | ICD-10-CM | POA: Diagnosis not present

## 2019-01-01 DIAGNOSIS — F3289 Other specified depressive episodes: Secondary | ICD-10-CM | POA: Diagnosis not present

## 2019-01-01 DIAGNOSIS — F319 Bipolar disorder, unspecified: Secondary | ICD-10-CM | POA: Diagnosis not present

## 2019-01-01 DIAGNOSIS — E639 Nutritional deficiency, unspecified: Secondary | ICD-10-CM | POA: Diagnosis not present

## 2019-01-01 DIAGNOSIS — R69 Illness, unspecified: Secondary | ICD-10-CM | POA: Diagnosis not present

## 2019-01-01 DIAGNOSIS — Z0289 Encounter for other administrative examinations: Secondary | ICD-10-CM

## 2019-01-01 DIAGNOSIS — R5383 Other fatigue: Secondary | ICD-10-CM

## 2019-01-01 DIAGNOSIS — E8881 Metabolic syndrome: Secondary | ICD-10-CM | POA: Diagnosis not present

## 2019-01-02 LAB — COMPREHENSIVE METABOLIC PANEL WITH GFR
ALT: 25 IU/L (ref 0–32)
AST: 25 IU/L (ref 0–40)
Albumin/Globulin Ratio: 1.6 (ref 1.2–2.2)
Albumin: 4.4 g/dL (ref 3.8–4.8)
Alkaline Phosphatase: 103 IU/L (ref 39–117)
BUN/Creatinine Ratio: 12 (ref 9–23)
BUN: 10 mg/dL (ref 6–24)
Bilirubin Total: 0.3 mg/dL (ref 0.0–1.2)
CO2: 23 mmol/L (ref 20–29)
Calcium: 9.3 mg/dL (ref 8.7–10.2)
Chloride: 99 mmol/L (ref 96–106)
Creatinine, Ser: 0.86 mg/dL (ref 0.57–1.00)
GFR calc Af Amer: 96 mL/min/1.73
GFR calc non Af Amer: 83 mL/min/1.73
Globulin, Total: 2.8 g/dL (ref 1.5–4.5)
Glucose: 83 mg/dL (ref 65–99)
Potassium: 4.2 mmol/L (ref 3.5–5.2)
Sodium: 138 mmol/L (ref 134–144)
Total Protein: 7.2 g/dL (ref 6.0–8.5)

## 2019-01-02 LAB — HEMOGLOBIN A1C
Est. average glucose Bld gHb Est-mCnc: 111 mg/dL
HEMOGLOBIN A1C: 5.5 % (ref 4.8–5.6)

## 2019-01-02 LAB — LIPID PANEL WITH LDL/HDL RATIO
Cholesterol, Total: 162 mg/dL (ref 100–199)
HDL: 66 mg/dL
LDL Calculated: 70 mg/dL (ref 0–99)
LDl/HDL Ratio: 1.1 ratio (ref 0.0–3.2)
Triglycerides: 129 mg/dL (ref 0–149)
VLDL Cholesterol Cal: 26 mg/dL (ref 5–40)

## 2019-01-02 LAB — CBC WITH DIFFERENTIAL
BASOS ABS: 0.1 10*3/uL (ref 0.0–0.2)
Basos: 1 %
EOS (ABSOLUTE): 0 10*3/uL (ref 0.0–0.4)
Eos: 0 %
Hematocrit: 40.6 % (ref 34.0–46.6)
Hemoglobin: 12.9 g/dL (ref 11.1–15.9)
Immature Grans (Abs): 0 10*3/uL (ref 0.0–0.1)
Immature Granulocytes: 0 %
Lymphocytes Absolute: 2.3 10*3/uL (ref 0.7–3.1)
Lymphs: 26 %
MCH: 26.2 pg — ABNORMAL LOW (ref 26.6–33.0)
MCHC: 31.8 g/dL (ref 31.5–35.7)
MCV: 83 fL (ref 79–97)
MONOS ABS: 0.5 10*3/uL (ref 0.1–0.9)
Monocytes: 5 %
Neutrophils Absolute: 6 10*3/uL (ref 1.4–7.0)
Neutrophils: 68 %
RBC: 4.92 x10E6/uL (ref 3.77–5.28)
RDW: 14.8 % (ref 11.7–15.4)
WBC: 8.9 10*3/uL (ref 3.4–10.8)

## 2019-01-02 LAB — VITAMIN B12: VITAMIN B 12: 759 pg/mL (ref 232–1245)

## 2019-01-02 LAB — INSULIN, RANDOM: INSULIN: 7.2 u[IU]/mL (ref 2.6–24.9)

## 2019-01-02 LAB — VITAMIN D 25 HYDROXY (VIT D DEFICIENCY, FRACTURES): Vit D, 25-Hydroxy: 13.6 ng/mL — ABNORMAL LOW (ref 30.0–100.0)

## 2019-01-02 LAB — TSH: TSH: 3.44 u[IU]/mL (ref 0.450–4.500)

## 2019-01-02 LAB — FOLATE: FOLATE: 5.7 ng/mL (ref >3.0)

## 2019-01-02 LAB — T3: T3, Total: 104 ng/dL (ref 71–180)

## 2019-01-02 LAB — T4, FREE: Free T4: 0.89 ng/dL (ref 0.82–1.77)

## 2019-01-02 NOTE — Progress Notes (Signed)
Office: [phone]  /  Fax: [phone]   Dear [name],   Thank you for referring Gloria Rodriguez to our clinic. The following note includes my evaluation and treatment recommendations.  HPI:   Chief Complaint: OBESITY    Gloria Rodriguez has been referred by [name], MD for consultation regarding her obesity and obesity related comorbidities.    Gloria Rodriguez (MR# [id number]) is a 43 y.o. female who presents on 01/01/2019 for obesity evaluation and treatment. Current BMI is Body mass index is 42.6 kg/m.. Gloria Rodriguez has been struggling with her weight for many years and has been unsuccessful in either losing weight, maintaining weight loss, or reaching her healthy weight goal.     Gloria Rodriguez is lactose intolerant.     [name] attended our information session and states she is currently in the action stage of change and ready to dedicate time achieving and maintaining a healthier weight. Gloria Rodriguez is interested in becoming our patient and working on intensive lifestyle modifications including (but not limited to) diet, exercise and weight loss.    Gloria Rodriguez states her family eats meals together she thinks her family will eat healthier with  her she struggles with family and or coworkers weight loss sabotage her desired weight loss is 106 lbs she started gaining weight around 10 years ago, especially after she had her 15 year old daughter her heaviest weight ever was 260 lbs she is a picky eater and doesn't like to eat healthier foods  she has significant food cravings issues  she snacks frequently in the evenings she wakes up frquently in the middle of the night to eat she skips meals frequently she is frequently drinking liquids with calories she frequently makes poor food choices she has problems with excessive hunger  she frequently eats larger portions than normal  she has binge eating behaviors she struggles with emotional eating    Gloria Rodriguez feels her energy is lower than it should be.  This has worsened with weight gain and has not worsened recently. Gloria Rodriguez admits to daytime somnolence and  denies waking up still tired. Patient is at risk for obstructive sleep apnea. Patent has a history of symptoms of daytime Gloria and morning headache. Patient generally gets 6 hours of sleep per night, and states they generally have generally restful sleep. Snoring is present. Apneic episodes are not present. Epworth Sleepiness Score is 13.  Dyspnea on exertion Gloria Rodriguez notes increasing shortness of breath with exercising and seems to be worsening over time with weight gain. She notes getting out of breath sooner with activity than she used to. This has not gotten worse recently. EKG-Normal sinus rhythm with poor R wave progression. Gloria Rodriguez denies orthopnea.  Bipolar Disorder [name] has bipolar disorder. She is on Lamictal and Zoloft.  Migraines [name] notes migraines. She sees Dr. Domingo Cocking at [facility].  Depression with emotional eating behaviors Gloria Rodriguez is struggling with emotional eating and using food for comfort to the extent that it is negatively impacting her health. She often snacks when she is not hungry. Gloria Rodriguez sometimes feels she is out of control and then feels guilty that she made poor food choices. She has been working on behavior modification techniques to help reduce her emotional eating and has been somewhat successful. Gloria Rodriguez reports previous history of suicidal ideas, she shows no sign of suicidal or homicidal ideations currently.  Depression screen [facility] 2/9 01/01/2019 06/25/2015  Decreased Interest 3 0  Down, Depressed, Hopeless 3 0  PHQ - 2  Score 6 0  Altered sleeping 3 -  Tired, decreased energy 3 -  Change in appetite 3 -  Feeling bad or failure about yourself  3 -  Trouble concentrating 1 -  Moving slowly or fidgety/restless 2 -  Suicidal thoughts 1 -  PHQ-9 Score 22 -  Difficult doing work/chores Extremely dIfficult -    Depression Screen Gloria Rodriguez's Food and Mood  (modified PHQ-9) score was  Depression screen PHQ 2/9 01/01/2019  Decreased Interest 3  Down, Depressed, Hopeless 3  PHQ - 2 Score 6  Altered sleeping 3  Tired, decreased energy 3  Change in appetite 3  Feeling bad or failure about yourself  3  Trouble concentrating 1  Moving slowly or fidgety/restless 2  Suicidal thoughts 1  PHQ-9 Score 22  Difficult doing work/chores Extremely dIfficult    ASSESSMENT AND PLAN:  Other Gloria - Plan: EKG 12-Lead, Vitamin B12, CBC With Differential, Comprehensive metabolic panel, Folate, Hemoglobin A1c, Insulin, random, Lipid Panel With LDL/HDL Ratio, T3, T4, free, TSH, VITAMIN D 25 Hydroxy (Vit-D Deficiency, Fractures)  Shortness of breath on exertion  Bipolar affective disorder, remission status unspecified (HCC)  Other migraine without status migrainosus, not intractable  Other depression - with emotional eating  Class 3 severe obesity with serious comorbidity and body mass index (BMI) of 40.0 to 44.9 in adult, unspecified obesity type (HCC)  PLAN:  Gloria Gloria Rodriguez was informed that her Gloria may be related to obesity, depression or many other causes. Labs will be ordered, and in the meanwhile Gloria Rodriguez has agreed to work on diet, exercise and weight loss to help with Gloria. Proper sleep hygiene was discussed including the need for 7-8 hours of quality sleep each night. A sleep study was not ordered based on symptoms and Epworth score.  Dyspnea on exertion Gloria Rodriguez's shortness of breath appears to be obesity related and exercise induced. She has agreed to work on weight loss and gradually increase exercise to treat her exercise induced shortness of breath. If Gloria Rodriguez follows our instructions and loses weight without improvement of her shortness of breath, we will plan to refer to pulmonology. We will monitor this condition regularly. Gloria Rodriguez agrees to this plan.  Bipolar Disorder We will check Hgb A1c and insulin today. [name] agrees to follow up with our  clinic in 2 weeks.  Migraines We will check CMP today and Gloria Rodriguez agrees to follow up with our clinic in 2 weeks.  Depression with Emotional Eating Behaviors We discussed behavior modification techniques today to help Isla deal with her emotional eating and depression. We will refer to Dr. Mallie Mussel, our bariatric psychologist. Shia agrees to follow up with our clinic in 2 weeks.  Depression Screen Tiffine had a strongly positive depression screening. Depression is commonly associated with obesity and often results in emotional eating behaviors. We will monitor this closely and work on CBT to help improve the non-hunger eating patterns. Referral to Psychology may be required if no improvement is seen as she continues in our clinic.  Obesity Gabriellah is currently in the action stage of change and her goal is to continue with weight loss efforts. I recommend Shanta begin the structured treatment plan as follows:  She has agreed to follow the Category 3 plan + 200-350 calorie microwave meals Bellarose has been instructed to eventually work up to a goal of 150 minutes of combined cardio and strengthening exercise per week for weight loss and overall health benefits. We discussed the following Behavioral Modification Strategies today: increasing lean  protein intake, decreasing simple carbohydrates, increasing vegetables, work on meal planning and easy cooking plans, dealing with family or coworker sabotage, and planning for success   She was informed of the importance of frequent follow up visits to maximize her success with intensive lifestyle modifications for her multiple health conditions. She was informed we would discuss her lab results at her next visit unless there is a critical issue that needs to be addressed sooner. Luz agreed to keep her next visit at the agreed upon time to discuss these results.  ALLERGIES: Allergies  Allergen Reactions  . Other Itching and Other (See Comments)    Any pain med that is  in a tablet form. Causes Itching, vomiting,migraine  . Codeine Other (See Comments)    migraines  . Hydrocodone Other (See Comments)    migraines  . Oxycodone Other (See Comments)    migraines  . Ultram [Tramadol Hcl] Other (See Comments)    Migraines/nightmares/ STATES ANY SODIUM BINDING TABLET CAUSES ALLERGIC REACTION  . Vicodin [Hydrocodone-Acetaminophen] Other (See Comments)    migraines    MEDICATIONS: Current Outpatient Medications on File Prior to Visit  Medication Sig Dispense Refill  . ALPRAZolam (XANAX) 0.25 MG tablet Take 0.25 mg by mouth as needed for anxiety.    Marland Kitchen amphetamine-dextroamphetamine (ADDERALL XR) 30 MG 24 hr capsule Take 30 mg by mouth daily with breakfast.     . amphetamine-dextroamphetamine (ADDERALL) 10 MG tablet Take 10 mg by mouth as needed.    Marland Kitchen aspirin-acetaminophen-caffeine (EXCEDRIN MIGRAINE) 250-250-65 MG tablet Take by mouth every 6 (six) hours as needed for headache.    . AZO-CRANBERRY PO Take by mouth as needed.    . cetirizine (ZYRTEC) 10 MG tablet Take 10 mg by mouth 2 (two) times daily.     . clomiPRAMINE (ANAFRANIL) 75 MG capsule Take 150 mg by mouth at bedtime.  3  . colestipol (COLESTID) 1 g tablet Take 1 tablet (1 g total) by mouth daily. 30 tablet 1  . Hyoscyamine Sulfate SL (LEVSIN/SL) 0.125 MG SUBL Dissolve 1 tablet on the tongue every 6 hours as needed for cramping, diarrhea. 60 each 6  . lamoTRIgine (LAMICTAL) 150 MG tablet Take 150 mg by mouth daily.    Marland Kitchen levonorgestrel (MIRENA) 20 MCG/24HR IUD 1 each by Intrauterine route once.    . methocarbamol (ROBAXIN) 500 MG tablet TAKE 1 TABLET TWICE A DAY AS NEEDED, LIMIT 1-2 TREATMENTS PER WEEK  0  . naproxen (NAPROSYN) 500 MG tablet Take 1 tablet by mouth as needed (migraine headaches).     . nitrofurantoin, macrocrystal-monohydrate, (MACROBID) 100 MG capsule Take 100 mg by mouth as needed.    . pantoprazole (PROTONIX) 40 MG tablet Take 30- 60 min before your first and last meals of the day 60  tablet 2  . pramipexole (MIRAPEX) 1 MG tablet Take 1 mg by mouth at bedtime.    . promethazine (PHENERGAN) 25 MG tablet Take 25 mg by mouth every 6 (six) hours as needed for nausea or vomiting (for migraine headaches).     . sertraline (ZOLOFT) 100 MG tablet Take 100 mg by mouth every morning.     . valACYclovir (VALTREX) 1000 MG tablet Take 1,000 mg by mouth as needed.    . zonisamide (ZONEGRAN) 100 MG capsule Take 2 capsules by mouth daily.   1   No current facility-administered medications on file prior to visit.     PAST MEDICAL HISTORY: Past Medical History:  Diagnosis Date  . ADD (attention  deficit disorder)   . Anxiety    hx bipolar, ADHD  . Bruxism   . Chronic kidney disease    kindney stones, interstitial cystitis history  . Chronic migraine   . Depression    states well controlled  . GERD (gastroesophageal reflux disease)    hx gallstones and pancreatitis per pt  . Headache(784.0)   . HSV infection    on Valtrex suppression  . Hypotension    normal rens 80/60  . Kidney problem   . Lactose intolerance   . No pertinent past medical history    states with gall bladder attacks with chest pain, vomiting and diarrhes    EKG  in EP[facility]  . OCD (obsessive compulsive disorder)   . Periodic limb movement   . Postpartum care and examination of lactating mother 07/13/2011  . Sleep apnea    NO SLEEP APNEA BUT STATES HAS PERIODIC LIMB MOVEMENT DISORDER_ WAS IN MIRAPEX PRE PREGNANCY  . Sleep disorder 03/03/2014  . SOB (shortness of breath)   . Swelling     PAST SURGICAL HISTORY: Past Surgical History:  Procedure Laterality Date  . CESAREAN SECTION    . CESAREAN SECTION  07/11/2011   Procedure: CESAREAN SECTION;  Surgeon: Princess Bruins;  Location: [facility];  Service: Gynecology;  Laterality: N/A;  Repeat  . CHOLECYSTECTOMY  11/14/2011   Procedure: LAPAROSCOPIC CHOLECYSTECTOMY WITH INTRAOPERATIVE CHOLANGIOGRAM;  Surgeon: Joyice Faster. Cornett, MD;  Location: [facility];  Service:  General;  Laterality: N/A;  Laparoscopic Cholecystectomy with cholangiogram, c-arm  . LASIK     2007  . TEE WITHOUT CARDIOVERSION N/A 11/18/2013   Procedure: TRANSESOPHAGEAL ECHOCARDIOGRAM (TEE);  Surgeon: Laverda Page, MD;  Location: [facility];  Service: Cardiovascular;  Laterality: N/A;    SOCIAL HISTORY: Social History   Tobacco Use  . Smoking status: Former Smoker    Packs/day: 0.25    Years: 1.50    Pack years: 0.37    Types: Cigarettes    Last attempt to quit: 07/31/2007    Years since quitting: 11.4  . Smokeless tobacco: Never Used  Substance Use Topics  . Alcohol use: Yes    Alcohol/week: 1.0 standard drinks    Types: 1 Cans of beer per week    Comment: socially   . Drug use: Yes    Types: Other-see comments, Marijuana    Comment: in college    FAMILY HISTORY: Family History  Problem Relation Age of Onset  . Hypertension Mother   . Hyperlipidemia Mother   . Thyroid disease Mother   . Depression Mother   . Anxiety disorder Mother   . Obesity Mother   . Heart disease Maternal Grandmother   . Prostate cancer Maternal Grandfather   . Stomach cancer Neg Hx   . Rectal cancer Neg Hx     ROS: Review of Systems  Constitutional: Positive for malaise/Gloria. Negative for weight loss.       + Trouble sleeping  HENT: Positive for sinus pain and tinnitus.        + Decreased hearing + Nasal stuffiness + Nasal discharge + Hay fever + Dry mouth  Eyes: Positive for blurred vision, double vision, photophobia, pain (Sensitivity) and redness.       + Vision changes + Wear glasses or contacts   Respiratory: Positive for shortness of breath (with exertion).   Cardiovascular: Negative for orthopnea.  Gastrointestinal: Positive for diarrhea, heartburn and nausea.  Genitourinary: Positive for frequency.  Musculoskeletal: Positive for back pain.       +  Muscle or joint pain + Muscle stiffness + Neck stiffness  Skin:       + Dryness + Hair or nail changes    Neurological: Positive for headaches (Migraines).  Endo/Heme/Allergies: Positive for polydipsia.       + Heat/cold intolerance  Psychiatric/Behavioral: Positive for depression. Negative for suicidal ideas. The patient is nervous/anxious.        + Stress + Anxiety    PHYSICAL EXAM: Blood pressure 111/77, pulse 94, temperature 98 F (36.7 C), temperature source Oral, height 5\' 5"  (1.651 m), weight 256 lb (116.1 kg), SpO2 98 %. Body mass index is 42.6 kg/m. Physical Exam Vitals signs reviewed.  Constitutional:      Appearance: Normal appearance. She is obese.  HENT:     Head: Normocephalic and atraumatic.     Nose: Nose normal.  Eyes:     General: No scleral icterus.    Extraocular Movements: Extraocular movements intact.  Neck:     Musculoskeletal: Normal range of motion and neck supple.     Comments: No thyromegaly present Cardiovascular:     Rate and Rhythm: Normal rate and regular rhythm.     Pulses: Normal pulses.     Heart sounds: Normal heart sounds.  Pulmonary:     Effort: Pulmonary effort is normal. No respiratory distress.     Breath sounds: Normal breath sounds.  Abdominal:     Palpations: Abdomen is soft.     Tenderness: There is no abdominal tenderness.     Comments: + Obesity  Musculoskeletal: Normal range of motion.     Right lower leg: No edema.     Left lower leg: No edema.  Skin:    General: Skin is warm and dry.  Neurological:     Mental Status: She is alert and oriented to person, place, and time.     Coordination: Coordination normal.  Psychiatric:        Mood and Affect: Mood normal.     RECENT LABS AND TESTS: BMET    Component Value Date/Time   NA 138 01/01/2019 1315   K 4.2 01/01/2019 1315   CL 99 01/01/2019 1315   CO2 23 01/01/2019 1315   GLUCOSE 83 01/01/2019 1315   GLUCOSE 79 03/25/2015 1217   BUN 10 01/01/2019 1315   CREATININE 0.86 01/01/2019 1315   CREATININE 0.73 03/25/2015 1217   CALCIUM 9.3 01/01/2019 1315   GFRNONAA 83  01/01/2019 1315   GFRAA 96 01/01/2019 1315   Lab Results  Component Value Date   HGBA1C 5.5 01/01/2019   Lab Results  Component Value Date   INSULIN 7.2 01/01/2019   CBC    Component Value Date/Time   WBC 8.9 01/01/2019 1315   WBC 8.1 03/25/2015 1225   WBC 12.9 (H) 11/15/2011 0432   RBC 4.92 01/01/2019 1315   RBC 4.54 03/25/2015 1225   RBC 4.21 11/15/2011 0432   HGB 12.9 01/01/2019 1315   HCT 40.6 01/01/2019 1315   PLT 222 11/15/2011 0432   MCV 83 01/01/2019 1315   MCH 26.2 (L) 01/01/2019 1315   MCH 26.9 (A) 03/25/2015 1225   MCH 28.0 11/15/2011 0432   MCHC 31.8 01/01/2019 1315   MCHC 31.9 03/25/2015 1225   MCHC 32.4 11/15/2011 0432   RDW 14.8 01/01/2019 1315   LYMPHSABS 2.3 01/01/2019 1315   MONOABS 0.1 11/14/2011 1731   EOSABS 0.0 01/01/2019 1315   BASOSABS 0.1 01/01/2019 1315   Iron/TIBC/Ferritin/ %Sat No results found for: IRON, TIBC, FERRITIN, IRONPCTSAT  Lipid Panel     Component Value Date/Time   CHOL 162 01/01/2019 1315   TRIG 129 01/01/2019 1315   HDL 66 01/01/2019 1315   LDLCALC 70 01/01/2019 1315   Hepatic Function Panel     Component Value Date/Time   PROT 7.2 01/01/2019 1315   ALBUMIN 4.4 01/01/2019 1315   AST 25 01/01/2019 1315   ALT 25 01/01/2019 1315   ALKPHOS 103 01/01/2019 1315   BILITOT 0.3 01/01/2019 1315      Component Value Date/Time   TSH 3.440 01/01/2019 1315   TSH 2.623 12/28/2014 1156   TSH 1.16 04/27/2014 1701    ECG  shows NSR with a rate of 95 BPM INDIRECT CALORIMETER done today shows a VO2 of 350 and a REE of 2434.  Her calculated basal metabolic rate is 2423 thus her basal metabolic rate is better than expected.       OBESITY BEHAVIORAL INTERVENTION VISIT  Today's visit was # 1   Starting weight: 256 lbs Starting date: 01/01/2019 Today's weight : 256 lbs  Today's date: 01/01/2019 Total lbs lost to date: 0 At least 15 minutes were spent on discussing the following behavioral intervention visit.   ASK: We  discussed the diagnosis of obesity with Resa C Kinnett today and Vernida agreed to give [place] permission to discuss obesity behavioral modification therapy today.  ASSESS: Deaisa has the diagnosis of obesity and her BMI today is 42.6 Annica is in the action stage of change   ADVISE: Ailea was educated on the multiple health risks of obesity as well as the benefit of weight loss to improve her health. She was advised of the need for long term treatment and the importance of lifestyle modifications to improve her current health and to decrease her risk of future health problems.  AGREE: Multiple dietary modification options and treatment options were discussed and  Rushie agreed to follow the recommendations documented in the above note.  ARRANGE: Lucas was educated on the importance of frequent visits to treat obesity as outlined per CMS and USPSTF guidelines and agreed to schedule her next follow up appointment today.  I, Trixie Dredge, am acting as transcriptionist for Ilene Qua, MD   I have reviewed the above documentation for accuracy and completeness, and I agree with the above. - Ilene Qua, MD

## 2019-01-07 ENCOUNTER — Encounter (INDEPENDENT_AMBULATORY_CARE_PROVIDER_SITE_OTHER): Payer: Self-pay | Admitting: Family Medicine

## 2019-01-09 ENCOUNTER — Other Ambulatory Visit: Payer: Self-pay | Admitting: Gastroenterology

## 2019-01-09 ENCOUNTER — Telehealth: Payer: Self-pay | Admitting: Gastroenterology

## 2019-01-09 MED ORDER — COLESTIPOL HCL 1 G PO TABS: 2.0000 g | ORAL_TABLET | Freq: Two times a day (BID) | ORAL | 1 refills | Status: DC

## 2019-01-09 NOTE — Telephone Encounter (Signed)
Pt call in and stated that she needed to double the medcication  twice and the prescription called into the drug store

## 2019-01-09 NOTE — Telephone Encounter (Signed)
rx sent to pharmacy

## 2019-01-10 ENCOUNTER — Encounter (INDEPENDENT_AMBULATORY_CARE_PROVIDER_SITE_OTHER): Payer: Self-pay | Admitting: Family Medicine
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
## 2019-01-14 ENCOUNTER — Ambulatory Visit (INDEPENDENT_AMBULATORY_CARE_PROVIDER_SITE_OTHER): Payer: Medicare HMO | Admitting: Psychology

## 2019-01-14 DIAGNOSIS — F3289 Other specified depressive episodes: Secondary | ICD-10-CM

## 2019-01-14 DIAGNOSIS — R69 Illness, unspecified: Secondary | ICD-10-CM | POA: Diagnosis not present

## 2019-01-15 ENCOUNTER — Ambulatory Visit (INDEPENDENT_AMBULATORY_CARE_PROVIDER_SITE_OTHER): Payer: Medicare HMO | Admitting: Family Medicine

## 2019-01-15 VITALS — BP 125/83 | HR 101 | Temp 97.8°F | Ht 65.0 in | Wt 252.0 lb

## 2019-01-15 DIAGNOSIS — Z6841 Body Mass Index (BMI) 40.0 and over, adult: Secondary | ICD-10-CM

## 2019-01-15 DIAGNOSIS — Z9189 Other specified personal risk factors, not elsewhere classified: Secondary | ICD-10-CM

## 2019-01-15 DIAGNOSIS — E8881 Metabolic syndrome: Secondary | ICD-10-CM

## 2019-01-15 DIAGNOSIS — E559 Vitamin D deficiency, unspecified: Secondary | ICD-10-CM

## 2019-01-15 DIAGNOSIS — E88819 Insulin resistance, unspecified: Secondary | ICD-10-CM

## 2019-01-15 MED ORDER — VITAMIN D (ERGOCALCIFEROL) 1.25 MG (50000 UNIT) PO CAPS: 50000.0000 [IU] | ORAL_CAPSULE | ORAL | 0 refills | Status: DC

## 2019-01-16 ENCOUNTER — Encounter (INDEPENDENT_AMBULATORY_CARE_PROVIDER_SITE_OTHER): Payer: Self-pay | Admitting: Family Medicine

## 2019-01-16 ENCOUNTER — Encounter: Payer: Self-pay | Admitting: Gastroenterology

## 2019-01-16 ENCOUNTER — Ambulatory Visit: Payer: Medicare HMO | Admitting: Gastroenterology

## 2019-01-16 VITALS — BP 120/86 | HR 96 | Ht 64.75 in | Wt 257.1 lb

## 2019-01-16 DIAGNOSIS — R197 Diarrhea, unspecified: Secondary | ICD-10-CM | POA: Diagnosis not present

## 2019-01-16 DIAGNOSIS — K219 Gastro-esophageal reflux disease without esophagitis: Secondary | ICD-10-CM

## 2019-01-16 NOTE — Progress Notes (Signed)
k  Referring Provider: Leonard Downing, * Primary Care Physician:  Leonard Downing, MD   Reason for Consultation:  Diarrhea and GERD   IMPRESSION:  GERD    - previously on Nexium 40 mg daily x 7 years    - controlled on Protonix diagnosed by Dr. Melvyn Novas Diarrhea aggravated by fatty meals - ? Bile acid diarrhea +/- diarrhea-predominant IBS    - developed after cholecystectomy    - gets constipated with antidiarrheal agents    - insurance refused Questran    - trial of Cholestipol underway Lactose intolerance Cholecystectomy for cholelithiasis 2012 Obesity with BMI of 43 Internal hemorrhoids on colononoscopy for bleeding prior to 2007 No family history of colon cancer or polyps  Hoping for improvement on the higher dose of Colestipol given her suspected bile acid diarrhea.   Discussed FODMAP diet as this may provide some improved control of suspected diarrhea-IBS.   PLAN: Continue Protonix Reviewed Low FODMAP diet - as she may be able to minimize exposure to foods exacerbating    - brochures and websites provided to the patient Continue Colestipol 4 grams BID Return to clinic in 6 months or earlier as needed  HPI: Gloria Rodriguez is a 43 y.o. female returns in scheduled follow-up. Interval history obtained through review of her electronic health record.  Recently disabled due to migraines. 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   Was unable to obtain Questran due to her insurance plan. Otherwise it was $400. She had to pay $100 for the Levsin SL. She has used them three times since she was last year. Insurance approved Colestipol.  Tolerating oatmeal, cream of wheat, fried egg, cereal on Colestipol 1 gram BID. Oils and salads exacerbate the symptoms. Increased Colestipol to 2 grams BID after two weeks. Then increased to 4 grams BID four days ago. No significant improvement in her symptoms yet.    Stools more formed after the first few days of Cholestipol. She  is then more brave with her diet and ends up in a viscious cycle. Stools are currently more formed and less watery but with diarrhea they can still be formless. Having 5 BM daily before. Now, she is having 1-3 times daily although now she feels a little backed up before the BM actually start.   Working with the [employer] loss program. Challenged because she is really only able to tolerate carbs, and she knows this needs to be avoided.   On Protonix her shortness of breath had improved.  Energy has improved. Occassional migraines and shortness of breath.   Past Medical History:  Diagnosis Date  . ADD (attention deficit disorder)   . Anxiety    hx bipolar, ADHD  . Bruxism   . Chronic kidney disease    kindney stones, interstitial cystitis history  . Chronic migraine   . Depression    states well controlled  . GERD (gastroesophageal reflux disease)    hx gallstones and pancreatitis per pt  . Headache(784.0)   . HSV infection    on Valtrex suppression  . Hypotension    normal rens 80/60  . Kidney problem   . Lactose intolerance   . No pertinent past medical history    states with gall bladder attacks with chest pain, vomiting and diarrhes    EKG  in EPIC  . OCD (obsessive compulsive disorder)   . Periodic limb movement   . Postpartum care and examination of lactating mother 07/13/2011  . Sleep  apnea    NO SLEEP APNEA BUT STATES HAS PERIODIC LIMB MOVEMENT DISORDER_ WAS IN MIRAPEX PRE PREGNANCY  . Sleep disorder 03/03/2014  . SOB (shortness of breath)   . Swelling     Past Surgical History:  Procedure Laterality Date  . CESAREAN SECTION    . CESAREAN SECTION  07/11/2011   Procedure: CESAREAN SECTION;  Surgeon: Princess Bruins;  Location: [facility];  Service: Gynecology;  Laterality: N/A;  Repeat  . CHOLECYSTECTOMY  11/14/2011   Procedure: LAPAROSCOPIC CHOLECYSTECTOMY WITH INTRAOPERATIVE CHOLANGIOGRAM;  Surgeon: Joyice Faster. Cornett, MD;  Location: [facility];  Service: General;   Laterality: N/A;  Laparoscopic Cholecystectomy with cholangiogram, c-arm  . LASIK     2007  . TEE WITHOUT CARDIOVERSION N/A 11/18/2013   Procedure: TRANSESOPHAGEAL ECHOCARDIOGRAM (TEE);  Surgeon: Laverda Page, MD;  Location: [facility];  Service: Cardiovascular;  Laterality: N/A;    Current Outpatient Medications  Medication Sig Dispense Refill  . ALPRAZolam (XANAX) 0.25 MG tablet Take 0.25 mg by mouth as needed for anxiety.    Marland Kitchen amphetamine-dextroamphetamine (ADDERALL XR) 30 MG 24 hr capsule Take 30 mg by mouth daily with breakfast.     . amphetamine-dextroamphetamine (ADDERALL) 10 MG tablet Take 10 mg by mouth as needed.    Marland Kitchen aspirin-acetaminophen-caffeine (EXCEDRIN MIGRAINE) 250-250-65 MG tablet Take by mouth every 6 (six) hours as needed for headache.    . AZO-CRANBERRY PO Take by mouth as needed.    . cetirizine (ZYRTEC) 10 MG tablet Take 10 mg by mouth 2 (two) times daily.     . clomiPRAMINE (ANAFRANIL) 75 MG capsule Take 150 mg by mouth at bedtime.  3  . colestipol (COLESTID) 1 g tablet Take 2 tablets (2 g total) by mouth 2 (two) times daily. (Patient taking differently: Take 2 g by mouth 4 (four) times daily. ) 60 tablet 1  . Hyoscyamine Sulfate SL (LEVSIN/SL) 0.125 MG SUBL Dissolve 1 tablet on the tongue every 6 hours as needed for cramping, diarrhea. 60 each 6  . lamoTRIgine (LAMICTAL) 150 MG tablet Take 150 mg by mouth daily.    Marland Kitchen levonorgestrel (MIRENA) 20 MCG/24HR IUD 1 each by Intrauterine route once.    . methocarbamol (ROBAXIN) 500 MG tablet TAKE 1 TABLET TWICE A DAY AS NEEDED, LIMIT 1-2 TREATMENTS PER WEEK  0  . naproxen (NAPROSYN) 500 MG tablet Take 1 tablet by mouth as needed (migraine headaches).     . nitrofurantoin, macrocrystal-monohydrate, (MACROBID) 100 MG capsule Take 100 mg by mouth as needed.    . pantoprazole (PROTONIX) 40 MG tablet Take 30- 60 min before your first and last meals of the day 60 tablet 2  . pramipexole (MIRAPEX) 1 MG tablet Take 1 mg by mouth  at bedtime.    . promethazine (PHENERGAN) 25 MG tablet Take 25 mg by mouth every 6 (six) hours as needed for nausea or vomiting (for migraine headaches).     . sertraline (ZOLOFT) 100 MG tablet Take 100 mg by mouth every morning.     . valACYclovir (VALTREX) 1000 MG tablet Take 1,000 mg by mouth as needed.    . Vitamin D, Ergocalciferol, (DRISDOL) 1.25 MG (50000 UT) CAPS capsule Take 1 capsule (50,000 Units total) by mouth every 7 (seven) days. 4 capsule 0  . zonisamide (ZONEGRAN) 100 MG capsule Take 2 capsules by mouth daily.   1   No current facility-administered medications for this visit.     Allergies as of 01/16/2019 - Review Complete 01/01/2019  Allergen  Reaction Noted  . Other Itching and Other (See Comments) 11/01/2011  . Codeine Other (See Comments) 07/11/2011  . Hydrocodone Other (See Comments) 07/11/2011  . Oxycodone Other (See Comments) 07/11/2011  . Ultram [tramadol hcl] Other (See Comments) 07/11/2011  . Vicodin [hydrocodone-acetaminophen] Other (See Comments) 07/11/2011    Family History  Problem Relation Age of Onset  . Hypertension Mother   . Hyperlipidemia Mother   . Thyroid disease Mother   . Depression Mother   . Anxiety disorder Mother   . Obesity Mother   . Heart disease Maternal Grandmother   . Prostate cancer Maternal Grandfather   . Stomach cancer Neg Hx   . Rectal cancer Neg Hx     Social History   Socioeconomic History  . Marital status: Married    Spouse name: Luceal Hollibaugh  . Number of children: 2  . Years of education: Not on file  . Highest education level: Not on file  Occupational History  . Occupation: Disabled  Social Needs  . Financial resource strain: Not on file  . Food insecurity:    Worry: Not on file    Inability: Not on file  . Transportation needs:    Medical: Not on file    Non-medical: Not on file  Tobacco Use  . Smoking status: Former Smoker    Packs/day: 0.25    Years: 1.50    Pack years: 0.37    Types: Cigarettes      Last attempt to quit: 07/31/2007    Years since quitting: 11.4  . Smokeless tobacco: Never Used  Substance and Sexual Activity  . Alcohol use: Yes    Alcohol/week: 1.0 standard drinks    Types: 1 Cans of beer per week    Comment: socially   . Drug use: Yes    Types: Other-see comments, Marijuana    Comment: in college  . Sexual activity: Yes    Birth control/protection: I.U.D.  Lifestyle  . Physical activity:    Days per week: Not on file    Minutes per session: Not on file  . Stress: Not on file  Relationships  . Social connections:    Talks on phone: Not on file    Gets together: Not on file    Attends religious service: Not on file    Active member of club or organization: Not on file    Attends meetings of clubs or organizations: Not on file    Relationship status: Not on file  . Intimate partner violence:    Fear of current or ex partner: Not on file    Emotionally abused: Not on file    Physically abused: Not on file    Forced sexual activity: Not on file  Other Topics Concern  . Not on file  Social History Narrative  . Not on file    Physical Exam: Vital signs were reviewed. General:   Alert, well-nourished, pleasant and cooperative in NAD Lungs:  Clear throughout to auscultation.   No wheezes. Heart:  Regular rate and rhythm; no murmurs Abdomen:  Soft, nontender, normal bowel sounds. No rebound or guarding. No hepatosplenomegaly Rectal:  Deferred  Skin:  No rash or bruise. Normal nail beds. Psych:  Alert and cooperative. Normal mood and affect.   Jaton Eilers L. Tarri Glenn Md, MPH [facility] 01/16/2019, 8:40 AM

## 2019-01-16 NOTE — Patient Instructions (Addendum)
Continue Protonix.  Continue Colestipol 4 grams twice a day.   Reviewed FODMAP diet. Brochure provided.   Please check out the website for extensive information: [email or web address]

## 2019-01-16 NOTE — Progress Notes (Signed)
Office: [phone]  /  Fax: [phone]   HPI:   Chief Complaint: OBESITY Gloria Rodriguez is here to discuss her progress with her obesity treatment plan. She is on the Category 3 plan + 200-350 calorie microwave meals and is following her eating plan approximately 50 % of the time. She states she is exercising 0 minutes 0 times per week. Gloria Rodriguez is having fairly significant GI issues and barely tolerating cream of wheat or oatmeal. She has a GI appointment tomorrow. She is thinking perhaps the meal plan is too heavy in dairy.  Her weight is 252 lb (114.3 kg) today and has had a weight loss of 4 pounds over a period of 2 weeks since her last visit. She has lost 4 lbs since starting treatment with [place].  Vitamin D Deficiency Gloria Rodriguez has a diagnosis of vitamin D deficiency. She is not currently taking OTC Vit D. She notes fatigue and denies nausea, vomiting or muscle weakness.  Insulin Resistance Gloria Rodriguez has a diagnosis of insulin resistance based on her elevated fasting insulin level >5. Last insulin of 7.2 and Hgb A1c of 5.5. Although Gloria Rodriguez's blood glucose readings are still under good control, insulin resistance puts her at greater risk of metabolic syndrome and diabetes. She is not taking metformin currently and continues to work on diet and exercise to decrease risk of diabetes.  At risk for diabetes Gloria Rodriguez is at higher than average risk for developing diabetes due to her obesity and insulin resistance. She currently denies polyuria or polydipsia.  ASSESSMENT AND PLAN:  Vitamin D deficiency - Plan: Vitamin D, Ergocalciferol, (DRISDOL) 1.25 MG (50000 UT) CAPS capsule  Insulin resistance  At risk for diabetes mellitus  Class 3 severe obesity with serious comorbidity and body mass index (BMI) of 40.0 to 44.9 in adult, unspecified obesity type ([place])  PLAN:  Vitamin D Deficiency Gloria Rodriguez was informed that low vitamin D levels contributes to fatigue and are associated with obesity, breast, and colon cancer.  Gloria Rodriguez agrees to start prescription Vit D @50 ,000 IU every week #4 with no refills. She will follow up for routine testing of vitamin D, at least 2-3 times per year. She was informed of the risk of over-replacement of vitamin D and agrees to not increase her dose unless she discusses this with [place] first. Gloria Rodriguez agrees to follow up with our clinic in 2 weeks.  Insulin Resistance Gloria Rodriguez will continue to work on weight loss, exercise, and decreasing simple carbohydrates in her diet to help decrease the risk of diabetes. We dicussed metformin including benefits and risks. She was informed that eating too many simple carbohydrates or too many calories at one sitting increases the likelihood of GI side effects. Gloria Rodriguez declined metformin for now and prescription was not written today. We will repeat Hgb A1c and insulin in 3 months. Gloria Rodriguez agrees to follow up with our clinic in 2 weeks as directed to monitor her progress.  Diabetes risk counselling Gloria Rodriguez was given extended (30 minutes) diabetes prevention counseling today. She is 43 y.o. female and has risk factors for diabetes including obesity and insulin resistance. We discussed intensive lifestyle modifications today with an emphasis on weight loss as well as increasing exercise and decreasing simple carbohydrates in her diet.  Obesity Gloria Rodriguez is currently in the action stage of change. As such, her goal is to continue with weight loss efforts She has agreed to keep a food journal with 1500-1800 calories and 95-115 grams of protein daily Gloria Rodriguez has been instructed to work up  to a goal of 150 minutes of combined cardio and strengthening exercise per week for weight loss and overall health benefits. We discussed the following Behavioral Modification Strategies today: increasing lean protein intake, increasing vegetables, work on meal planning and easy cooking plans, and planning for success   Gloria Rodriguez has agreed to follow up with our clinic in 2 weeks. She was informed of the  importance of frequent follow up visits to maximize her success with intensive lifestyle modifications for her multiple health conditions.  ALLERGIES: Allergies  Allergen Reactions  . Other Itching and Other (See Comments)    Any pain med that is in a tablet form. Causes Itching, vomiting,migraine  . Codeine Other (See Comments)    migraines  . Hydrocodone Other (See Comments)    migraines  . Oxycodone Other (See Comments)    migraines  . Ultram [Tramadol Hcl] Other (See Comments)    Migraines/nightmares/ STATES ANY SODIUM BINDING TABLET CAUSES ALLERGIC REACTION  . Vicodin [Hydrocodone-Acetaminophen] Other (See Comments)    migraines    MEDICATIONS: Current Outpatient Medications on File Prior to Visit  Medication Sig Dispense Refill  . ALPRAZolam (XANAX) 0.25 MG tablet Take 0.25 mg by mouth as needed for anxiety.    Marland Kitchen amphetamine-dextroamphetamine (ADDERALL XR) 30 MG 24 hr capsule Take 30 mg by mouth daily with breakfast.     . amphetamine-dextroamphetamine (ADDERALL) 10 MG tablet Take 10 mg by mouth as needed.    Marland Kitchen aspirin-acetaminophen-caffeine (EXCEDRIN MIGRAINE) 250-250-65 MG tablet Take by mouth every 6 (six) hours as needed for headache.    . AZO-CRANBERRY PO Take by mouth as needed.    . cetirizine (ZYRTEC) 10 MG tablet Take 10 mg by mouth 2 (two) times daily.     . clomiPRAMINE (ANAFRANIL) 75 MG capsule Take 150 mg by mouth at bedtime.  3  . colestipol (COLESTID) 1 g tablet Take 2 tablets (2 g total) by mouth 2 (two) times daily. (Patient taking differently: Take 2 g by mouth 4 (four) times daily. ) 60 tablet 1  . Hyoscyamine Sulfate SL (LEVSIN/SL) 0.125 MG SUBL Dissolve 1 tablet on the tongue every 6 hours as needed for cramping, diarrhea. 60 each 6  . lamoTRIgine (LAMICTAL) 150 MG tablet Take 150 mg by mouth daily.    Marland Kitchen levonorgestrel (MIRENA) 20 MCG/24HR IUD 1 each by Intrauterine route once.    . methocarbamol (ROBAXIN) 500 MG tablet TAKE 1 TABLET TWICE A DAY AS NEEDED,  LIMIT 1-2 TREATMENTS PER WEEK  0  . naproxen (NAPROSYN) 500 MG tablet Take 1 tablet by mouth as needed (migraine headaches).     . nitrofurantoin, macrocrystal-monohydrate, (MACROBID) 100 MG capsule Take 100 mg by mouth as needed.    . pantoprazole (PROTONIX) 40 MG tablet Take 30- 60 min before your first and last meals of the day 60 tablet 2  . pramipexole (MIRAPEX) 1 MG tablet Take 1 mg by mouth at bedtime.    . promethazine (PHENERGAN) 25 MG tablet Take 25 mg by mouth every 6 (six) hours as needed for nausea or vomiting (for migraine headaches).     . sertraline (ZOLOFT) 100 MG tablet Take 100 mg by mouth every morning.     . valACYclovir (VALTREX) 1000 MG tablet Take 1,000 mg by mouth as needed.    . zonisamide (ZONEGRAN) 100 MG capsule Take 2 capsules by mouth daily.   1   No current facility-administered medications on file prior to visit.     PAST MEDICAL HISTORY: Past  Medical History:  Diagnosis Date  . ADD (attention deficit disorder)   . Anxiety    hx bipolar, ADHD  . Bruxism   . Chronic kidney disease    kindney stones, interstitial cystitis history  . Chronic migraine   . Depression    states well controlled  . GERD (gastroesophageal reflux disease)    hx gallstones and pancreatitis per pt  . Headache(784.0)   . HSV infection    on Valtrex suppression  . Hypotension    normal rens 80/60  . Kidney problem   . Lactose intolerance   . No pertinent past medical history    states with gall bladder attacks with chest pain, vomiting and diarrhes    EKG  in EPIC  . OCD (obsessive compulsive disorder)   . Periodic limb movement   . Postpartum care and examination of lactating mother 07/13/2011  . Sleep apnea    NO SLEEP APNEA BUT STATES HAS PERIODIC LIMB MOVEMENT DISORDER_ WAS IN MIRAPEX PRE PREGNANCY  . Sleep disorder 03/03/2014  . SOB (shortness of breath)   . Swelling     PAST SURGICAL HISTORY: Past Surgical History:  Procedure Laterality Date  . CESAREAN SECTION     . CESAREAN SECTION  07/11/2011   Procedure: CESAREAN SECTION;  Surgeon: Princess Bruins;  Location: [facility];  Service: Gynecology;  Laterality: N/A;  Repeat  . CHOLECYSTECTOMY  11/14/2011   Procedure: LAPAROSCOPIC CHOLECYSTECTOMY WITH INTRAOPERATIVE CHOLANGIOGRAM;  Surgeon: Joyice Faster. Cornett, MD;  Location: [facility];  Service: General;  Laterality: N/A;  Laparoscopic Cholecystectomy with cholangiogram, c-arm  . LASIK     2007  . TEE WITHOUT CARDIOVERSION N/A 11/18/2013   Procedure: TRANSESOPHAGEAL ECHOCARDIOGRAM (TEE);  Surgeon: Laverda Page, MD;  Location: [facility];  Service: Cardiovascular;  Laterality: N/A;    SOCIAL HISTORY: Social History   Tobacco Use  . Smoking status: Former Smoker    Packs/day: 0.25    Years: 1.50    Pack years: 0.37    Types: Cigarettes    Last attempt to quit: 07/31/2007    Years since quitting: 11.4  . Smokeless tobacco: Never Used  Substance Use Topics  . Alcohol use: Yes    Alcohol/week: 1.0 standard drinks    Types: 1 Cans of beer per week    Comment: socially   . Drug use: Yes    Types: Other-see comments, Marijuana    Comment: in college    FAMILY HISTORY: Family History  Problem Relation Age of Onset  . Hypertension Mother   . Hyperlipidemia Mother   . Thyroid disease Mother   . Depression Mother   . Anxiety disorder Mother   . Obesity Mother   . Heart disease Maternal Grandmother   . Prostate cancer Maternal Grandfather   . Stomach cancer Neg Hx   . Rectal cancer Neg Hx     ROS: Review of Systems  Constitutional: Positive for malaise/fatigue and weight loss.  Gastrointestinal: Negative for nausea and vomiting.  Genitourinary: Negative for frequency.  Musculoskeletal:       Negative muscle weakness  Endo/Heme/Allergies: Negative for polydipsia.    PHYSICAL EXAM: Blood pressure 125/83, pulse (!) 101, temperature 97.8 F (36.6 C), temperature source Oral, height 5\' 5"  (1.651 m), weight 252 lb (114.3 kg), SpO2 100  %. Body mass index is 41.93 kg/m. Physical Exam Vitals signs reviewed.  Constitutional:      Appearance: Normal appearance. She is obese.  Cardiovascular:     Rate and Rhythm: Normal  rate.     Pulses: Normal pulses.  Pulmonary:     Effort: Pulmonary effort is normal.     Breath sounds: Normal breath sounds.  Musculoskeletal: Normal range of motion.  Skin:    General: Skin is warm and dry.  Neurological:     Mental Status: She is alert and oriented to person, place, and time.  Psychiatric:        Mood and Affect: Mood normal.        Behavior: Behavior normal.     RECENT LABS AND TESTS: BMET    Component Value Date/Time   NA 138 01/01/2019 1315   K 4.2 01/01/2019 1315   CL 99 01/01/2019 1315   CO2 23 01/01/2019 1315   GLUCOSE 83 01/01/2019 1315   GLUCOSE 79 03/25/2015 1217   BUN 10 01/01/2019 1315   CREATININE 0.86 01/01/2019 1315   CREATININE 0.73 03/25/2015 1217   CALCIUM 9.3 01/01/2019 1315   GFRNONAA 83 01/01/2019 1315   GFRAA 96 01/01/2019 1315   Lab Results  Component Value Date   HGBA1C 5.5 01/01/2019   Lab Results  Component Value Date   INSULIN 7.2 01/01/2019   CBC    Component Value Date/Time   WBC 8.9 01/01/2019 1315   WBC 8.1 03/25/2015 1225   WBC 12.9 (H) 11/15/2011 0432   RBC 4.92 01/01/2019 1315   RBC 4.54 03/25/2015 1225   RBC 4.21 11/15/2011 0432   HGB 12.9 01/01/2019 1315   HCT 40.6 01/01/2019 1315   PLT 222 11/15/2011 0432   MCV 83 01/01/2019 1315   MCH 26.2 (L) 01/01/2019 1315   MCH 26.9 (A) 03/25/2015 1225   MCH 28.0 11/15/2011 0432   MCHC 31.8 01/01/2019 1315   MCHC 31.9 03/25/2015 1225   MCHC 32.4 11/15/2011 0432   RDW 14.8 01/01/2019 1315   LYMPHSABS 2.3 01/01/2019 1315   MONOABS 0.1 11/14/2011 1731   EOSABS 0.0 01/01/2019 1315   BASOSABS 0.1 01/01/2019 1315   Iron/TIBC/Ferritin/ %Sat No results found for: IRON, TIBC, FERRITIN, IRONPCTSAT Lipid Panel     Component Value Date/Time   CHOL 162 01/01/2019 1315   TRIG  129 01/01/2019 1315   HDL 66 01/01/2019 1315   LDLCALC 70 01/01/2019 1315   Hepatic Function Panel     Component Value Date/Time   PROT 7.2 01/01/2019 1315   ALBUMIN 4.4 01/01/2019 1315   AST 25 01/01/2019 1315   ALT 25 01/01/2019 1315   ALKPHOS 103 01/01/2019 1315   BILITOT 0.3 01/01/2019 1315      Component Value Date/Time   TSH 3.440 01/01/2019 1315   TSH 2.623 12/28/2014 1156   TSH 1.16 04/27/2014 1701      OBESITY BEHAVIORAL INTERVENTION VISIT  Today's visit was # 2   Starting weight: 256 lbs Starting date: 01/01/2019 Today's weight : 252 lbs  Today's date: 01/15/2019 Total lbs lost to date: 4    ASK: We discussed the diagnosis of obesity with Gloria Rodriguez today and Gloria Rodriguez agreed to give [place] permission to discuss obesity behavioral modification therapy today.  ASSESS: Gloria Rodriguez has the diagnosis of obesity and her BMI today is 41.93 Gloria Rodriguez is in the action stage of change   ADVISE: Gloria Rodriguez was educated on the multiple health risks of obesity as well as the benefit of weight loss to improve her health. She was advised of the need for long term treatment and the importance of lifestyle modifications to improve her current health and to decrease her risk of  future health problems.  AGREE: Multiple dietary modification options and treatment options were discussed and  Gloria Rodriguez agreed to follow the recommendations documented in the above note.  ARRANGE: Gloria Rodriguez was educated on the importance of frequent visits to treat obesity as outlined per CMS and USPSTF guidelines and agreed to schedule her next follow up appointment today.  I, Trixie Dredge, am acting as transcriptionist for Tiajuana Amass, MD  I have reviewed the above documentation for accuracy and completeness, and I agree with the above. - Ilene Qua, MD

## 2019-01-28 ENCOUNTER — Ambulatory Visit (INDEPENDENT_AMBULATORY_CARE_PROVIDER_SITE_OTHER): Payer: Self-pay | Admitting: Psychology

## 2019-01-28 ENCOUNTER — Ambulatory Visit (INDEPENDENT_AMBULATORY_CARE_PROVIDER_SITE_OTHER): Payer: Medicare HMO | Admitting: Physician Assistant

## 2019-01-28 VITALS — BP 120/83 | HR 73 | Temp 98.4°F | Ht 65.0 in | Wt 253.0 lb

## 2019-01-28 DIAGNOSIS — E8881 Metabolic syndrome: Secondary | ICD-10-CM | POA: Diagnosis not present

## 2019-01-28 DIAGNOSIS — Z6841 Body Mass Index (BMI) 40.0 and over, adult: Secondary | ICD-10-CM

## 2019-01-28 DIAGNOSIS — R69 Illness, unspecified: Secondary | ICD-10-CM | POA: Diagnosis not present

## 2019-01-28 DIAGNOSIS — F429 Obsessive-compulsive disorder, unspecified: Secondary | ICD-10-CM | POA: Diagnosis not present

## 2019-01-28 DIAGNOSIS — E559 Vitamin D deficiency, unspecified: Secondary | ICD-10-CM | POA: Diagnosis not present

## 2019-01-28 DIAGNOSIS — F900 Attention-deficit hyperactivity disorder, predominantly inattentive type: Secondary | ICD-10-CM | POA: Diagnosis not present

## 2019-01-28 MED ORDER — VITAMIN D (ERGOCALCIFEROL) 1.25 MG (50000 UNIT) PO CAPS
50000.0000 [IU] | ORAL_CAPSULE | ORAL | 0 refills | Status: DC
Start: 2019-01-28 — End: 2019-02-12

## 2019-01-28 NOTE — Progress Notes (Signed)
Office: [phone]  /  Fax: [phone]   HPI:   Chief Complaint: OBESITY Perry is here to discuss her progress with her obesity treatment plan. She is on the Category 3 plan with journaling and is following her eating plan approximately 0% of the time. She states she is exercising 0 minutes 0 times per week. Karson reports that she has been unable to follow the meal plan due to GI upset and diarrhea. Her GI doctor instructed her to go on a low FODMAP diet but she continues to have diarrhea. Her weight is 253 lb (114.8 kg) today and has had a weight gain of 1 lb since her last visit. She has lost 3 lbs since starting treatment with [place].  Vitamin D deficiency Emmely has a diagnosis of Vitamin D deficiency. She is currently taking prescription Vit D and denies nausea, vomiting or muscle weakness.  Insulin Resistance Wiktoria has a diagnosis of insulin resistance based on her elevated fasting insulin level >5. Although Annaka's blood glucose readings are still under good control, insulin resistance puts her at greater risk of metabolic syndrome and diabetes. She is on no medications and denies polyphagia. She continues to work on diet and exercise to decrease risk of diabetes.  ASSESSMENT AND PLAN:  Vitamin D deficiency - Plan: Vitamin D, Ergocalciferol, (DRISDOL) 1.25 MG (50000 UT) CAPS capsule  Insulin resistance  Class 3 severe obesity with serious comorbidity and body mass index (BMI) of 40.0 to 44.9 in adult, unspecified obesity type ([place])  PLAN:  Vitamin D Deficiency Sativa was informed that low Vitamin D levels contributes to fatigue and are associated with obesity, breast, and colon cancer. She agrees to continue to take prescription Vit D @ 50,000 IU every week #4 with no refills and will follow-up for routine testing of Vitamin D, at least 2-3 times per year. She was informed of the risk of over-replacement of Vitamin D and agrees to not increase her dose unless she discusses this with [place]  first. Maitland agrees to follow-up with our clinic in 2 weeks.  Insulin Resistance Emelina will continue to work on weight loss, exercise, and decreasing simple carbohydrates in her diet to help decrease the risk of diabetes. We dicussed metformin including benefits and risks. She was informed that eating too many simple carbohydrates or too many calories at one sitting increases the likelihood of GI side effects. Xandrea agreed to follow-up with [place] as directed to monitor her progress.  Obesity Mycala is currently in the action stage of change. As such, her goal is to continue with weight loss efforts. She has agreed to follow the Category 3 plan and a low FODMAP diet was given that coincides with Category 3 plan. Anglia has been instructed to work up to a goal of 150 minutes of combined cardio and strengthening exercise per week for weight loss and overall health benefits. We discussed the following Behavioral Modification Strategies today: no skipping meals and work on meal planning and easy cooking plans.  Fatou has agreed to follow-up with our clinic in 2 weeks. She was informed of the importance of frequent follow-up visits to maximize her success with intensive lifestyle modifications for her multiple health conditions.  ALLERGIES: Allergies  Allergen Reactions  . Other Itching and Other (See Comments)    Any pain med that is in a tablet form. Causes Itching, vomiting,migraine  . Codeine Other (See Comments)    migraines  . Hydrocodone Other (See Comments)    migraines  .  Oxycodone Other (See Comments)    migraines  . Ultram [Tramadol Hcl] Other (See Comments)    Migraines/nightmares/ STATES ANY SODIUM BINDING TABLET CAUSES ALLERGIC REACTION  . Vicodin [Hydrocodone-Acetaminophen] Other (See Comments)    migraines    MEDICATIONS: Current Outpatient Medications on File Prior to Visit  Medication Sig Dispense Refill  . ALPRAZolam (XANAX) 0.25 MG tablet Take 0.25 mg by mouth as needed for  anxiety.    Marland Kitchen amphetamine-dextroamphetamine (ADDERALL XR) 30 MG 24 hr capsule Take 30 mg by mouth daily with breakfast.     . amphetamine-dextroamphetamine (ADDERALL) 10 MG tablet Take 10 mg by mouth as needed.    Marland Kitchen aspirin-acetaminophen-caffeine (EXCEDRIN MIGRAINE) 250-250-65 MG tablet Take by mouth every 6 (six) hours as needed for headache.    . AZO-CRANBERRY PO Take by mouth as needed.    . cetirizine (ZYRTEC) 10 MG tablet Take 10 mg by mouth 2 (two) times daily.     . clomiPRAMINE (ANAFRANIL) 75 MG capsule Take 150 mg by mouth at bedtime.  3  . colestipol (COLESTID) 1 g tablet Take 2 tablets (2 g total) by mouth 2 (two) times daily. (Patient taking differently: Take 2 g by mouth 4 (four) times daily. ) 60 tablet 1  . Hyoscyamine Sulfate SL (LEVSIN/SL) 0.125 MG SUBL Dissolve 1 tablet on the tongue every 6 hours as needed for cramping, diarrhea. 60 each 6  . lamoTRIgine (LAMICTAL) 150 MG tablet Take 150 mg by mouth daily.    Marland Kitchen levonorgestrel (MIRENA) 20 MCG/24HR IUD 1 each by Intrauterine route once.    . methocarbamol (ROBAXIN) 500 MG tablet TAKE 1 TABLET TWICE A DAY AS NEEDED, LIMIT 1-2 TREATMENTS PER WEEK  0  . naproxen (NAPROSYN) 500 MG tablet Take 1 tablet by mouth as needed (migraine headaches).     . nitrofurantoin, macrocrystal-monohydrate, (MACROBID) 100 MG capsule Take 100 mg by mouth as needed.    . pantoprazole (PROTONIX) 40 MG tablet Take 30- 60 min before your first and last meals of the day 60 tablet 2  . pramipexole (MIRAPEX) 1 MG tablet Take 1 mg by mouth at bedtime.    . promethazine (PHENERGAN) 25 MG tablet Take 25 mg by mouth every 6 (six) hours as needed for nausea or vomiting (for migraine headaches).     . sertraline (ZOLOFT) 100 MG tablet Take 100 mg by mouth every morning.     . valACYclovir (VALTREX) 1000 MG tablet Take 1,000 mg by mouth as needed.    . zonisamide (ZONEGRAN) 100 MG capsule Take 2 capsules by mouth daily.   1   No current facility-administered  medications on file prior to visit.     PAST MEDICAL HISTORY: Past Medical History:  Diagnosis Date  . ADD (attention deficit disorder)   . Anxiety    hx bipolar, ADHD  . Bruxism   . Chronic kidney disease    kindney stones, interstitial cystitis history  . Chronic migraine   . Depression    states well controlled  . GERD (gastroesophageal reflux disease)    hx gallstones and pancreatitis per pt  . Headache(784.0)   . HSV infection    on Valtrex suppression  . Hypotension    normal rens 80/60  . Kidney problem   . Lactose intolerance   . No pertinent past medical history    states with gall bladder attacks with chest pain, vomiting and diarrhes    EKG  in [facility]  . OCD (obsessive compulsive disorder)   .  Periodic limb movement   . Postpartum care and examination of lactating mother 07/13/2011  . Sleep apnea    NO SLEEP APNEA BUT STATES HAS PERIODIC LIMB MOVEMENT DISORDER_ WAS IN MIRAPEX PRE PREGNANCY  . Sleep disorder 03/03/2014  . SOB (shortness of breath)   . Swelling     PAST SURGICAL HISTORY: Past Surgical History:  Procedure Laterality Date  . CESAREAN SECTION    . CESAREAN SECTION  07/11/2011   Procedure: CESAREAN SECTION;  Surgeon: Princess Bruins;  Location: [facility];  Service: Gynecology;  Laterality: N/A;  Repeat  . CHOLECYSTECTOMY  11/14/2011   Procedure: LAPAROSCOPIC CHOLECYSTECTOMY WITH INTRAOPERATIVE CHOLANGIOGRAM;  Surgeon: Joyice Faster. Cornett, MD;  Location: [facility];  Service: General;  Laterality: N/A;  Laparoscopic Cholecystectomy with cholangiogram, c-arm  . LASIK     2007  . TEE WITHOUT CARDIOVERSION N/A 11/18/2013   Procedure: TRANSESOPHAGEAL ECHOCARDIOGRAM (TEE);  Surgeon: Laverda Page, MD;  Location: [facility];  Service: Cardiovascular;  Laterality: N/A;    SOCIAL HISTORY: Social History   Tobacco Use  . Smoking status: Former Smoker    Packs/day: 0.25    Years: 1.50    Pack years: 0.37    Types: Cigarettes    Last attempt to quit:  07/31/2007    Years since quitting: 11.5  . Smokeless tobacco: Never Used  Substance Use Topics  . Alcohol use: Yes    Alcohol/week: 1.0 standard drinks    Types: 1 Cans of beer per week    Comment: socially   . Drug use: Yes    Types: Other-see comments, Marijuana    Comment: in college    FAMILY HISTORY: Family History  Problem Relation Age of Onset  . Hypertension Mother   . Hyperlipidemia Mother   . Thyroid disease Mother   . Depression Mother   . Anxiety disorder Mother   . Obesity Mother   . Heart disease Maternal Grandmother   . Prostate cancer Maternal Grandfather   . Stomach cancer Neg Hx   . Rectal cancer Neg Hx    ROS: Review of Systems  Constitutional: Negative for weight loss.  Gastrointestinal: Negative for nausea and vomiting.  Musculoskeletal:       Negative for muscle weakness.  Endo/Heme/Allergies:       Negative for polyphagia. Negative for hypoglycemia.   PHYSICAL EXAM: Blood pressure 120/83, pulse 73, temperature 98.4 F (36.9 C), height 5\' 5"  (1.651 m), weight 253 lb (114.8 kg), SpO2 94 %. Body mass index is 42.1 kg/m. Physical Exam Vitals signs reviewed.  Constitutional:      Appearance: Normal appearance. She is obese.  Cardiovascular:     Rate and Rhythm: Normal rate.     Pulses: Normal pulses.  Pulmonary:     Effort: Pulmonary effort is normal.     Breath sounds: Normal breath sounds.  Musculoskeletal: Normal range of motion.  Skin:    General: Skin is warm and dry.  Neurological:     Mental Status: She is alert and oriented to person, place, and time.  Psychiatric:        Behavior: Behavior normal.   RECENT LABS AND TESTS: BMET    Component Value Date/Time   NA 138 01/01/2019 1315   K 4.2 01/01/2019 1315   CL 99 01/01/2019 1315   CO2 23 01/01/2019 1315   GLUCOSE 83 01/01/2019 1315   GLUCOSE 79 03/25/2015 1217   BUN 10 01/01/2019 1315   CREATININE 0.86 01/01/2019 1315   CREATININE 0.73 03/25/2015  1217   CALCIUM 9.3  01/01/2019 1315   GFRNONAA 83 01/01/2019 1315   GFRAA 96 01/01/2019 1315   Lab Results  Component Value Date   HGBA1C 5.5 01/01/2019   Lab Results  Component Value Date   INSULIN 7.2 01/01/2019   CBC    Component Value Date/Time   WBC 8.9 01/01/2019 1315   WBC 8.1 03/25/2015 1225   WBC 12.9 (H) 11/15/2011 0432   RBC 4.92 01/01/2019 1315   RBC 4.54 03/25/2015 1225   RBC 4.21 11/15/2011 0432   HGB 12.9 01/01/2019 1315   HCT 40.6 01/01/2019 1315   PLT 222 11/15/2011 0432   MCV 83 01/01/2019 1315   MCH 26.2 (L) 01/01/2019 1315   MCH 26.9 (A) 03/25/2015 1225   MCH 28.0 11/15/2011 0432   MCHC 31.8 01/01/2019 1315   MCHC 31.9 03/25/2015 1225   MCHC 32.4 11/15/2011 0432   RDW 14.8 01/01/2019 1315   LYMPHSABS 2.3 01/01/2019 1315   MONOABS 0.1 11/14/2011 1731   EOSABS 0.0 01/01/2019 1315   BASOSABS 0.1 01/01/2019 1315   Iron/TIBC/Ferritin/ %Sat No results found for: IRON, TIBC, FERRITIN, IRONPCTSAT Lipid Panel     Component Value Date/Time   CHOL 162 01/01/2019 1315   TRIG 129 01/01/2019 1315   HDL 66 01/01/2019 1315   LDLCALC 70 01/01/2019 1315   Hepatic Function Panel     Component Value Date/Time   PROT 7.2 01/01/2019 1315   ALBUMIN 4.4 01/01/2019 1315   AST 25 01/01/2019 1315   ALT 25 01/01/2019 1315   ALKPHOS 103 01/01/2019 1315   BILITOT 0.3 01/01/2019 1315      Component Value Date/Time   TSH 3.440 01/01/2019 1315   TSH 2.623 12/28/2014 1156   TSH 1.16 04/27/2014 1701    Ref. Range 01/01/2019 13:15  Vitamin D, 25-Hydroxy Latest Ref Range: 30.0 - 100.0 ng/mL 13.6 (L)   OBESITY BEHAVIORAL INTERVENTION VISIT  Today's visit was #3   Starting weight: 256 lbs Starting date: 01/01/2019 Today's weight: 253 lbs  Today's date: 01/28/2019 Total lbs lost to date: 3 At least 15 minutes were spent on discussing the following behavioral intervention visit.  ASK: We discussed the diagnosis of obesity with Ayra C Economou today and Jacqulene agreed to give [place]  permission to discuss obesity behavioral modification therapy today.  ASSESS: Isabellah has the diagnosis of obesity and her BMI today is 42.10. Aloha is in the action stage of change.  ADVISE: Elinda was educated on the multiple health risks of obesity as well as the benefit of weight loss to improve her health. She was advised of the need for long term treatment and the importance of lifestyle modifications to improve her current health and to decrease her risk of future health problems.  AGREE: Multiple dietary modification options and treatment options were discussed and  Gicela agreed to follow the recommendations documented in the above note.  ARRANGE: Selyna was educated on the importance of frequent visits to treat obesity as outlined per CMS and USPSTF guidelines and agreed to schedule her next follow up appointment today.  Migdalia Dk, am acting as transcriptionist for Abby Potash, PA-C I, Abby Potash, PA-C have reviewed above note and agree with its content

## 2019-01-30 ENCOUNTER — Ambulatory Visit (INDEPENDENT_AMBULATORY_CARE_PROVIDER_SITE_OTHER): Payer: Self-pay | Admitting: Psychology

## 2019-02-04 ENCOUNTER — Telehealth: Payer: Self-pay | Admitting: Gastroenterology

## 2019-02-04 NOTE — Telephone Encounter (Signed)
Despite  Colestid 2 GM BID she is still having 2-5 diarrhea stools a day. She is using FODMAP diet and still having diarrhea.  She is asking if there "is something else wrong"?  She is asking if she needs a colonoscopy or other diagnositic tests?

## 2019-02-04 NOTE — Telephone Encounter (Signed)
Patient notified EGD/colonoscopy and pre-visit scheduled as well as follow up post procedure visit.

## 2019-02-04 NOTE — Telephone Encounter (Signed)
I am sorry that she is still not feeling better. It would be fine to proceed with EGD with duodenal biopsies and colonoscopies. She should have an office visit with me after the endoscopy. Please schedule at her convenience. Thanks.

## 2019-02-04 NOTE — Telephone Encounter (Signed)
Patient called in she advised she is still having same symptoms even with the increase medication dosage. She is also experiencing nausea.

## 2019-02-07 ENCOUNTER — Other Ambulatory Visit (INDEPENDENT_AMBULATORY_CARE_PROVIDER_SITE_OTHER): Payer: Self-pay | Admitting: Family Medicine

## 2019-02-07 DIAGNOSIS — E559 Vitamin D deficiency, unspecified: Secondary | ICD-10-CM

## 2019-02-10 ENCOUNTER — Other Ambulatory Visit: Payer: Self-pay | Admitting: Gastroenterology

## 2019-02-10 ENCOUNTER — Encounter (INDEPENDENT_AMBULATORY_CARE_PROVIDER_SITE_OTHER): Payer: Self-pay | Admitting: Physician Assistant

## 2019-02-10 NOTE — Progress Notes (Addendum)
Office: [phone]  /  Fax: [phone]    Date: February 12, 2019   Time Seen: 3:57pm Duration: 36 minutes Provider: Glennie Isle, Psy.D. Type of Session: Individual Therapy  Type of Contact: Face-to-face  Session Content: Gloria Rodriguez is a 43 y.o. female presenting for a follow-up appointment to address the previously established treatment goal of decreasing emotional eating. The session was initiated with the administration of the PHQ-9 and GAD-7, as well as a brief check-in. Gloria Rodriguez reported she is scheduled for an appointment tomorrow based on the referral placed by this provider. She indicated she will inform her new therapist that she continues to meet with this provider, and agreed to sign authorizations for coordination of care if deemed necessary. Gloria Rodriguez further indicated she met with Ms. Puolos on Tuesday, January 28, 2019 for medication management. The next appointment is scheduled for July 28, 2019. Gloria Rodriguez denied any changes being made to her psychotropic medications. Based on Gloria Rodriguez's history, a brief risk assessment was completed. She denied experiencing suicidal and homicidal ideation, plan, intent since last appointment with this provider. Moreover, Gloria Rodriguez discussed ongoing memory issues, and indicated she will be seeing her neurologist tomorrow. As such, this provider recommended she further discuss the memory issues tomorrow; Gloria Rodriguez agreed. Additionally, the MMSE-2 was administered by this provider based on Gloria Rodriguez's concerns. In addition, Gloria Rodriguez reported continued GI issues resulting in her having limited options for food. Additionally, she denied experiencing emotional eating episodes since the last appointment with this provider due to the ongoing issues. Brief psychoeducation was provided regarding triggers for emotional eating, and Gloria Rodriguez was provided with a handout. She was encouraged to identify her triggers for emotional eating between today and the next appointment with this provider. Gloria Rodriguez was  receptive to today's session as evidenced by openness to sharing, responsiveness to feedback, and willingness to identify triggers for emotional eating.  Mental Status Examination: Gloria Rodriguez arrived early for the appointment. She presented as appropriately dressed and groomed. Gloria Rodriguez appeared her stated age and demonstrated adequate orientation to time, place, person, and purpose of the appointment. She also demonstrated appropriate eye contact. No psychomotor abnormalities or behavioral peculiarities noted. Her mood was euthymic with congruent affect. Her thought processes were logical, linear, and goal-directed. No hallucinations, delusions, bizarre thinking or behavior reported or observed. Judgment, insight, and impulse control appeared to be grossly intact. There was no evidence of paraphasias (i.e., errors in speech, gross mispronunciations, and word substitutions), repetition deficits, or disturbances in volume or prosody (i.e., rhythm and intonation). There was no evidence of attention or memory impairments. Gloria Rodriguez denied current suicidal and homicidal ideation, plan and intent.   Structured Assessment Results: The Patient Health Questionnaire-9 (PHQ-9) is a self-report measure that assesses symptoms and severity of depression over the course of the last two weeks. Gloria Rodriguez obtained a score of 12 suggesting moderate depression. Gloria Rodriguez finds the endorsed symptoms to be very difficult. Depression screen PHQ 2/9 02/12/2019  Decreased Interest 1  Down, Depressed, Hopeless 1  PHQ - 2 Score 2  Altered sleeping 2  Tired, decreased energy 3  Change in appetite 3  Feeling bad or failure about yourself  0  Trouble concentrating 2  Moving slowly or fidgety/restless 0  Suicidal thoughts 0  PHQ-9 Score 12  Difficult doing work/chores -   The Generalized Anxiety Disorder-7 (GAD-7) is a brief self-report measure that assesses symptoms of anxiety over the course of the last two weeks. Gloria Rodriguez obtained a score of 8 suggesting  mild anxiety. GAD 7 : Generalized Anxiety  Score 02/12/2019  Nervous, Anxious, on Edge 1  Control/stop worrying 1  Worry too much - different things 1  Trouble relaxing 1  Restless 1  Easily annoyed or irritable 2  Afraid - awful might happen 1  Total GAD 7 Score 8  Anxiety Difficulty Somewhat difficult   The Mini-Mental State Examination, Second Edition (MMSE-2) was administered. The MMSE-2 briefly screens for cognitive dysfunction and overall mental status and assesses different cognitive domains: orientation, registration, attention and calculation, recall, and language and praxis. Gloria Rodriguez received 30 out of 30 points possible on the MMSE-2, which is noted in the normal range.   Interventions:  Administration of PHQ-9 and GAD-7 for symptom monitoring Administration of MMSE-2 Review of content from the previous session Empathic reflections and validation Risk assessment Brief psychoeducation regarding triggers for emotional eating Rapport building Brief chart review  DSM-5 Diagnosis: 311 (F32.8) Other Specified Depressive Disorder, Emotional Eating Behaviors and Bipolar affective disorder, remission status unspecified (per chart review)  Treatment Goal & Progress: During the initial appointment with this provider, the following treatment goal was established: decrease emotional eating. Progress is limited, as Blessen has just begun treatment with this provider; however, she is receptive to the interaction and interventions and rapport is being established. Nevertheless, Shamone has demonstrated some progress in her goal as evidenced by increased awareness of hunger patterns.  Plan: Juanda continues to appear able and willing to participate as evidenced by engagement in reciprocal conversation, and asking questions for clarification as appropriate. The next appointment will be scheduled in two weeks. The next session will focus on reviewing triggers for emotional eating, and working towards the  established treatment goal.

## 2019-02-11 ENCOUNTER — Encounter: Payer: Self-pay | Admitting: Gastroenterology

## 2019-02-12 ENCOUNTER — Ambulatory Visit (INDEPENDENT_AMBULATORY_CARE_PROVIDER_SITE_OTHER): Payer: Medicare HMO | Admitting: Psychology

## 2019-02-12 ENCOUNTER — Ambulatory Visit (INDEPENDENT_AMBULATORY_CARE_PROVIDER_SITE_OTHER): Payer: Medicare HMO | Admitting: Physician Assistant

## 2019-02-12 VITALS — BP 112/74 | HR 113 | Temp 98.0°F | Ht 65.0 in | Wt 250.0 lb

## 2019-02-12 DIAGNOSIS — F3289 Other specified depressive episodes: Secondary | ICD-10-CM | POA: Diagnosis not present

## 2019-02-12 DIAGNOSIS — R69 Illness, unspecified: Secondary | ICD-10-CM | POA: Diagnosis not present

## 2019-02-12 DIAGNOSIS — Z9189 Other specified personal risk factors, not elsewhere classified: Secondary | ICD-10-CM | POA: Diagnosis not present

## 2019-02-12 DIAGNOSIS — Z6841 Body Mass Index (BMI) 40.0 and over, adult: Secondary | ICD-10-CM

## 2019-02-12 DIAGNOSIS — E559 Vitamin D deficiency, unspecified: Secondary | ICD-10-CM

## 2019-02-12 DIAGNOSIS — E8881 Metabolic syndrome: Secondary | ICD-10-CM

## 2019-02-12 MED ORDER — VITAMIN D (ERGOCALCIFEROL) 1.25 MG (50000 UNIT) PO CAPS: 50000.0000 [IU] | ORAL_CAPSULE | ORAL | 0 refills | Status: DC

## 2019-02-13 ENCOUNTER — Ambulatory Visit: Payer: Medicare HMO | Admitting: Psychology

## 2019-02-13 NOTE — Progress Notes (Signed)
Office: [phone]  /  Fax: [phone]   HPI:   Chief Complaint: OBESITY Dashanna is here to discuss her progress with her obesity treatment plan. She is on the FODMAP diet and is following her eating plan approximately 100% of the time. She states she is walking 30 minutes 2 times per week. Yaniyah continues to follow the FODMAP diet due to GI problems. She is having a colonoscopy and endoscopy in 3 weeks. Her weight is 250 lb (113.4 kg) today and has had a weight loss of 3 pounds over a period of 2 weeks since her last visit. She has lost 6 lbs since starting treatment with [place].  Vitamin D deficiency Yamaris has a diagnosis of Vitamin D deficiency. She is currently taking prescription Vit D and denies nausea, vomiting or muscle weakness.  At risk for osteopenia and osteoporosis Lilu is at higher risk of osteopenia and osteoporosis due to Vitamin D deficiency.   Insulin Resistance Ashlee has a diagnosis of insulin resistance based on her elevated fasting insulin level >5. Although Masaye's blood glucose readings are still under good control, insulin resistance puts her at greater risk of metabolic syndrome and diabetes. She is not taking metformin currently and continues to work on diet and exercise to decrease risk of diabetes. She denies polyphagia.  ASSESSMENT AND PLAN:  Vitamin D deficiency - Plan: Vitamin D, Ergocalciferol, (DRISDOL) 1.25 MG (50000 UT) CAPS capsule  Insulin resistance  At risk for osteoporosis  Class 3 severe obesity with serious comorbidity and body mass index (BMI) of 40.0 to 44.9 in adult, unspecified obesity type ([place])  PLAN:  Vitamin D Deficiency Giuliana was informed that low Vitamin D levels contributes to fatigue and are associated with obesity, breast, and colon cancer. She agrees to continue to take prescription Vit D @ 50,000 IU every week #4 with 0 refills and will follow-up for routine testing of Vitamin D, at least 2-3 times per year. She was informed of the  risk of over-replacement of Vitamin D and agrees to not increase her dose unless she discusses this with [place] first. Yasmine agrees to follow-up with our clinic in 4 weeks.  At risk for osteopenia and osteoporosis Jodiann was given extended  (15 minutes) osteoporosis prevention counseling today. Magda is at risk for osteopenia and osteoporsis due to her Vitamin D deficiency. She was encouraged to take her Vitamin D and follow her higher calcium diet and increase strengthening exercise to help strengthen her bones and decrease her risk of osteopenia and osteoporosis.  Insulin Resistance Angella will continue to work on weight loss, exercise, and decreasing simple carbohydrates in her diet to help decrease the risk of diabetes. We dicussed metformin including benefits and risks. She was informed that eating too many simple carbohydrates or too many calories at one sitting increases the likelihood of GI side effects. Chundra is on no medications and agreed to follow-up with [place] as directed to monitor her progress.  Obesity Fadumo is currently in the action stage of change. As such, her goal is to continue with weight loss efforts. She has agreed to change her plan and begin keeping a food journal with 1500-1800 calories and 95 grams of protein daily. Patty has been instructed to work up to a goal of 150 minutes of combined cardio and strengthening exercise per week for weight loss and overall health benefits. We discussed the following Behavioral Modification Strategies today: work on meal planning and easy cooking plans.  Rumi has agreed to follow-up  with our clinic in 4 weeks. She was informed of the importance of frequent follow up visits to maximize her success with intensive lifestyle modifications for her multiple health conditions.  ALLERGIES: Allergies  Allergen Reactions  . Other Itching and Other (See Comments)    Any pain med that is in a tablet form. Causes Itching, vomiting,migraine  . Codeine Other  (See Comments)    migraines  . Hydrocodone Other (See Comments)    migraines  . Oxycodone Other (See Comments)    migraines  . Ultram [Tramadol Hcl] Other (See Comments)    Migraines/nightmares/ STATES ANY SODIUM BINDING TABLET CAUSES ALLERGIC REACTION  . Vicodin [Hydrocodone-Acetaminophen] Other (See Comments)    migraines    MEDICATIONS: Current Outpatient Medications on File Prior to Visit  Medication Sig Dispense Refill  . ALPRAZolam (XANAX) 0.25 MG tablet Take 0.25 mg by mouth as needed for anxiety.    Marland Kitchen amphetamine-dextroamphetamine (ADDERALL XR) 30 MG 24 hr capsule Take 30 mg by mouth daily with breakfast.     . amphetamine-dextroamphetamine (ADDERALL) 10 MG tablet Take 10 mg by mouth as needed.    Marland Kitchen aspirin-acetaminophen-caffeine (EXCEDRIN MIGRAINE) 250-250-65 MG tablet Take by mouth every 6 (six) hours as needed for headache.    . AZO-CRANBERRY PO Take by mouth as needed.    . cetirizine (ZYRTEC) 10 MG tablet Take 10 mg by mouth 2 (two) times daily.     . clomiPRAMINE (ANAFRANIL) 75 MG capsule Take 150 mg by mouth at bedtime.  3  . colestipol (COLESTID) 1 g tablet TAKE 2 TABLETS (2 G TOTAL) BY MOUTH 2 (TWO) TIMES DAILY. 60 tablet 1  . Hyoscyamine Sulfate SL (LEVSIN/SL) 0.125 MG SUBL Dissolve 1 tablet on the tongue every 6 hours as needed for cramping, diarrhea. 60 each 6  . lamoTRIgine (LAMICTAL) 150 MG tablet Take 150 mg by mouth daily.    Marland Kitchen levonorgestrel (MIRENA) 20 MCG/24HR IUD 1 each by Intrauterine route once.    . methocarbamol (ROBAXIN) 500 MG tablet TAKE 1 TABLET TWICE A DAY AS NEEDED, LIMIT 1-2 TREATMENTS PER WEEK  0  . naproxen (NAPROSYN) 500 MG tablet Take 1 tablet by mouth as needed (migraine headaches).     . nitrofurantoin, macrocrystal-monohydrate, (MACROBID) 100 MG capsule Take 100 mg by mouth as needed.    . pantoprazole (PROTONIX) 40 MG tablet Take 30- 60 min before your first and last meals of the day 60 tablet 2  . pramipexole (MIRAPEX) 1 MG tablet Take 1  mg by mouth at bedtime.    . promethazine (PHENERGAN) 25 MG tablet Take 25 mg by mouth every 6 (six) hours as needed for nausea or vomiting (for migraine headaches).     . sertraline (ZOLOFT) 100 MG tablet Take 100 mg by mouth every morning.     . valACYclovir (VALTREX) 1000 MG tablet Take 1,000 mg by mouth as needed.    . zonisamide (ZONEGRAN) 100 MG capsule Take 2 capsules by mouth daily.   1   No current facility-administered medications on file prior to visit.     PAST MEDICAL HISTORY: Past Medical History:  Diagnosis Date  . ADD (attention deficit disorder)   . Anxiety    hx bipolar, ADHD  . Bruxism   . Chronic kidney disease    kindney stones, interstitial cystitis history  . Chronic migraine   . Depression    states well controlled  . GERD (gastroesophageal reflux disease)    hx gallstones and pancreatitis per pt  .  Headache(784.0)   . HSV infection    on Valtrex suppression  . Hypotension    normal rens 80/60  . Kidney problem   . Lactose intolerance   . No pertinent past medical history    states with gall bladder attacks with chest pain, vomiting and diarrhes    EKG  in [facility]  . OCD (obsessive compulsive disorder)   . Periodic limb movement   . Postpartum care and examination of lactating mother 07/13/2011  . Sleep apnea    NO SLEEP APNEA BUT STATES HAS PERIODIC LIMB MOVEMENT DISORDER_ WAS IN MIRAPEX PRE PREGNANCY  . Sleep disorder 03/03/2014  . SOB (shortness of breath)   . Swelling     PAST SURGICAL HISTORY: Past Surgical History:  Procedure Laterality Date  . CESAREAN SECTION    . CESAREAN SECTION  07/11/2011   Procedure: CESAREAN SECTION;  Surgeon: Princess Bruins;  Location: [facility];  Service: Gynecology;  Laterality: N/A;  Repeat  . CHOLECYSTECTOMY  11/14/2011   Procedure: LAPAROSCOPIC CHOLECYSTECTOMY WITH INTRAOPERATIVE CHOLANGIOGRAM;  Surgeon: Joyice Faster. Cornett, MD;  Location: [facility];  Service: General;  Laterality: N/A;  Laparoscopic Cholecystectomy with  cholangiogram, c-arm  . LASIK     2007  . TEE WITHOUT CARDIOVERSION N/A 11/18/2013   Procedure: TRANSESOPHAGEAL ECHOCARDIOGRAM (TEE);  Surgeon: Laverda Page, MD;  Location: [facility];  Service: Cardiovascular;  Laterality: N/A;    SOCIAL HISTORY: Social History   Tobacco Use  . Smoking status: Former Smoker    Packs/day: 0.25    Years: 1.50    Pack years: 0.37    Types: Cigarettes    Last attempt to quit: 07/31/2007    Years since quitting: 11.5  . Smokeless tobacco: Never Used  Substance Use Topics  . Alcohol use: Yes    Alcohol/week: 1.0 standard drinks    Types: 1 Cans of beer per week    Comment: socially   . Drug use: Yes    Types: Other-see comments, Marijuana    Comment: in college    FAMILY HISTORY: Family History  Problem Relation Age of Onset  . Hypertension Mother   . Hyperlipidemia Mother   . Thyroid disease Mother   . Depression Mother   . Anxiety disorder Mother   . Obesity Mother   . Heart disease Maternal Grandmother   . Prostate cancer Maternal Grandfather   . Stomach cancer Neg Hx   . Rectal cancer Neg Hx    ROS: Review of Systems  Constitutional: Positive for weight loss.  Gastrointestinal: Negative for nausea and vomiting.  Musculoskeletal:       Negative for muscle weakness.  Endo/Heme/Allergies:       Negative for polyphagia. Negative for hypoglycemia.   PHYSICAL EXAM: Blood pressure 112/74, pulse (!) 113, temperature 98 F (36.7 C), temperature source Oral, height 5\' 5"  (1.651 m), weight 250 lb (113.4 kg), SpO2 95 %. Body mass index is 41.6 kg/m. Physical Exam Vitals signs reviewed.  Constitutional:      Appearance: Normal appearance. She is obese.  Cardiovascular:     Rate and Rhythm: Normal rate.     Pulses: Normal pulses.  Pulmonary:     Effort: Pulmonary effort is normal.     Breath sounds: Normal breath sounds.  Musculoskeletal: Normal range of motion.  Skin:    General: Skin is warm and dry.  Neurological:      Mental Status: She is alert and oriented to person, place, and time.  Psychiatric:  Behavior: Behavior normal.   RECENT LABS AND TESTS: BMET    Component Value Date/Time   NA 138 01/01/2019 1315   K 4.2 01/01/2019 1315   CL 99 01/01/2019 1315   CO2 23 01/01/2019 1315   GLUCOSE 83 01/01/2019 1315   GLUCOSE 79 03/25/2015 1217   BUN 10 01/01/2019 1315   CREATININE 0.86 01/01/2019 1315   CREATININE 0.73 03/25/2015 1217   CALCIUM 9.3 01/01/2019 1315   GFRNONAA 83 01/01/2019 1315   GFRAA 96 01/01/2019 1315   Lab Results  Component Value Date   HGBA1C 5.5 01/01/2019   Lab Results  Component Value Date   INSULIN 7.2 01/01/2019   CBC    Component Value Date/Time   WBC 8.9 01/01/2019 1315   WBC 8.1 03/25/2015 1225   WBC 12.9 (H) 11/15/2011 0432   RBC 4.92 01/01/2019 1315   RBC 4.54 03/25/2015 1225   RBC 4.21 11/15/2011 0432   HGB 12.9 01/01/2019 1315   HCT 40.6 01/01/2019 1315   PLT 222 11/15/2011 0432   MCV 83 01/01/2019 1315   MCH 26.2 (L) 01/01/2019 1315   MCH 26.9 (A) 03/25/2015 1225   MCH 28.0 11/15/2011 0432   MCHC 31.8 01/01/2019 1315   MCHC 31.9 03/25/2015 1225   MCHC 32.4 11/15/2011 0432   RDW 14.8 01/01/2019 1315   LYMPHSABS 2.3 01/01/2019 1315   MONOABS 0.1 11/14/2011 1731   EOSABS 0.0 01/01/2019 1315   BASOSABS 0.1 01/01/2019 1315   Iron/TIBC/Ferritin/ %Sat No results found for: IRON, TIBC, FERRITIN, IRONPCTSAT Lipid Panel     Component Value Date/Time   CHOL 162 01/01/2019 1315   TRIG 129 01/01/2019 1315   HDL 66 01/01/2019 1315   LDLCALC 70 01/01/2019 1315   Hepatic Function Panel     Component Value Date/Time   PROT 7.2 01/01/2019 1315   ALBUMIN 4.4 01/01/2019 1315   AST 25 01/01/2019 1315   ALT 25 01/01/2019 1315   ALKPHOS 103 01/01/2019 1315   BILITOT 0.3 01/01/2019 1315      Component Value Date/Time   TSH 3.440 01/01/2019 1315   TSH 2.623 12/28/2014 1156   TSH 1.16 04/27/2014 1701    Ref. Range 01/01/2019 13:15  Vitamin D,  25-Hydroxy Latest Ref Range: 30.0 - 100.0 ng/mL 13.6 (L)   OBESITY BEHAVIORAL INTERVENTION VISIT  Today's visit was #4  Starting weight: 256 lbs Starting date: 01/01/2019 Today's weight: 250 lbs  Today's date: 02/12/2019 Total lbs lost to date: 6    02/12/2019  Height 5\' 5"  (1.651 m)  Weight 250 lb (113.4 kg)  BMI (Calculated) 41.6  BLOOD PRESSURE - SYSTOLIC 564  BLOOD PRESSURE - DIASTOLIC 74   Body Fat % 33.2 %  Total Body Water (lbs) 87.2 lbs   ASK: We discussed the diagnosis of obesity with Mickie C Hayman today and Mileidy agreed to give [place] permission to discuss obesity behavioral modification therapy today.  ASSESS: Charo has the diagnosis of obesity and her BMI today is 41.6. Driana is in the action stage of change.   ADVISE: Arleatha was educated on the multiple health risks of obesity as well as the benefit of weight loss to improve her health. She was advised of the need for long term treatment and the importance of lifestyle modifications to improve her current health and to decrease her risk of future health problems.  AGREE: Multiple dietary modification options and treatment options were discussed and  Jodeen agreed to follow the recommendations documented in the above note.  ARRANGE: Teneka was educated on the importance of frequent visits to treat obesity as outlined per CMS and USPSTF guidelines and agreed to schedule her next follow up appointment today.  Migdalia Dk, am acting as transcriptionist for Abby Potash, PA-C I, Abby Potash, PA-C have reviewed above note and agree with its content

## 2019-02-14 ENCOUNTER — Telehealth: Payer: Self-pay | Admitting: Gastroenterology

## 2019-02-14 MED ORDER — ONDANSETRON HCL 4 MG PO TABS: 4.0000 mg | ORAL_TABLET | Freq: Four times a day (QID) | ORAL | 1 refills | Status: DC | PRN

## 2019-02-14 NOTE — Telephone Encounter (Signed)
Patient reports continued nausea na doesn't feel she can eat much but oatmeal.  Her enod/colon procedure has been moved up to 02/20/19.  Discussed with Nicoletta Ba PA.  Zofran rx sent 4 mg q 6 hours prn nausea.

## 2019-02-16 ENCOUNTER — Encounter (INDEPENDENT_AMBULATORY_CARE_PROVIDER_SITE_OTHER): Payer: Self-pay | Admitting: Physician Assistant

## 2019-02-18 ENCOUNTER — Encounter: Payer: Self-pay | Admitting: Gastroenterology

## 2019-02-18 ENCOUNTER — Other Ambulatory Visit: Payer: Self-pay

## 2019-02-18 ENCOUNTER — Ambulatory Visit (AMBULATORY_SURGERY_CENTER): Payer: Self-pay | Admitting: *Deleted

## 2019-02-18 VITALS — Ht 65.0 in | Wt 254.0 lb

## 2019-02-18 DIAGNOSIS — M791 Myalgia, unspecified site: Secondary | ICD-10-CM | POA: Diagnosis not present

## 2019-02-18 DIAGNOSIS — G518 Other disorders of facial nerve: Secondary | ICD-10-CM | POA: Diagnosis not present

## 2019-02-18 DIAGNOSIS — R197 Diarrhea, unspecified: Secondary | ICD-10-CM

## 2019-02-18 DIAGNOSIS — K219 Gastro-esophageal reflux disease without esophagitis: Secondary | ICD-10-CM

## 2019-02-18 DIAGNOSIS — M542 Cervicalgia: Secondary | ICD-10-CM | POA: Diagnosis not present

## 2019-02-18 DIAGNOSIS — G43719 Chronic migraine without aura, intractable, without status migrainosus: Secondary | ICD-10-CM | POA: Diagnosis not present

## 2019-02-18 MED ORDER — NA SULFATE-K SULFATE-MG SULF 17.5-3.13-1.6 GM/177ML PO SOLN: 1.0000 | Freq: Once | ORAL | 0 refills | Status: AC

## 2019-02-18 NOTE — Progress Notes (Signed)
With gall bladder surgery BP dropped low, had to stop procedure , give meds and then restart surgery per pt.  No egg or soy allergy known to patient  No issues with past sedation with any surgeries  or procedures, no intubation problems  No diet pills per patient No home 02 use per patient  No blood thinners per patient  Pt denies issues with constipation  No A fib or A flutter  EMMI video sent to pt's e mail declined

## 2019-02-20 ENCOUNTER — Ambulatory Visit (AMBULATORY_SURGERY_CENTER): Payer: Medicare HMO | Admitting: Gastroenterology

## 2019-02-20 ENCOUNTER — Other Ambulatory Visit: Payer: Self-pay

## 2019-02-20 ENCOUNTER — Telehealth: Payer: Self-pay | Admitting: Gastroenterology

## 2019-02-20 ENCOUNTER — Encounter: Payer: Self-pay | Admitting: Gastroenterology

## 2019-02-20 ENCOUNTER — Telehealth: Payer: Self-pay

## 2019-02-20 VITALS — BP 119/73 | HR 81 | Temp 96.2°F | Resp 18 | Ht 65.0 in | Wt 250.0 lb

## 2019-02-20 DIAGNOSIS — K219 Gastro-esophageal reflux disease without esophagitis: Secondary | ICD-10-CM | POA: Diagnosis not present

## 2019-02-20 DIAGNOSIS — R195 Other fecal abnormalities: Secondary | ICD-10-CM | POA: Diagnosis not present

## 2019-02-20 DIAGNOSIS — K529 Noninfective gastroenteritis and colitis, unspecified: Secondary | ICD-10-CM | POA: Diagnosis not present

## 2019-02-20 DIAGNOSIS — K228 Other specified diseases of esophagus: Secondary | ICD-10-CM | POA: Diagnosis not present

## 2019-02-20 DIAGNOSIS — R197 Diarrhea, unspecified: Secondary | ICD-10-CM | POA: Diagnosis not present

## 2019-02-20 NOTE — Telephone Encounter (Signed)
Pls call pt, she is having some pain in her esophagus.

## 2019-02-20 NOTE — Telephone Encounter (Signed)
Patient called office to report pain. Called the patient back. Patient stating she ate and is having discomfort mid chest like heartburn.(4/10) Patient does not have:fever, vomiting, or chest pain that radiates to her back. Patient is able to swallow. Patient has taken her Protonix this am and will take evening dose not. Encouraged the patient to stay on a soft diet today. Patient to call back with progression of symptoms including fever, shortness of breath, inability to swallow or not feeling that she is not well. Patient agreed with plan.

## 2019-02-20 NOTE — Op Note (Signed)
Gloria Rodriguez Procedure Date: 02/20/2019 9:06 AM MRN: [id number] Endoscopist: [name] MD, MD Age: 43 Referring MD:  Date of Birth: 03-02-1976 Gender: Female Account #: [id number] Procedure:                Upper GI endoscopy Indications:              Diarrhea, GERD Medicines:                See the Anesthesia note for documentation of the                            administered medications Procedure:                Pre-Anesthesia Assessment:                           - Prior to the procedure, a History and Physical                            was performed, and patient medications and                            allergies were reviewed. The patient's tolerance of                            previous anesthesia was also reviewed. The risks                            and benefits of the procedure and the sedation                            options and risks were discussed with the patient.                            All questions were answered, and informed consent                            was obtained. Prior Anticoagulants: The patient has                            taken no previous anticoagulant or antiplatelet                            agents. ASA Grade Assessment: II - A patient with                            mild systemic disease. After reviewing the risks                            and benefits, the patient was deemed in                            satisfactory condition to undergo the procedure.  After obtaining informed consent, the endoscope was                            passed under direct vision. Throughout the                            procedure, the patient's blood pressure, pulse, and                            oxygen saturations were monitored continuously. The                            Endoscope was introduced through the mouth, and                            advanced to the second part of duodenum. The  upper                            GI endoscopy was accomplished without difficulty.                            The patient tolerated the procedure well. Scope In: Scope Out: Findings:                 LA Grade A (one or more mucosal breaks less than 5                            mm, not extending between tops of 2 mucosal folds)                            esophagitis with no bleeding was found. Biopsies                            were obtained from the proximal and distal                            esophagus with cold forceps for histology of                            suspected eosinophilic esophagitis.                           The stomach was normal.                           The examined duodenum was normal.                           The exam was otherwise without abnormality. Complications:            No immediate complications. Estimated blood loss:                            Minimal. Estimated Blood Loss:     Estimated blood loss was minimal. Impression:               -  LA Grade A esophagitis. Biopsied.                           - Normal stomach.                           - Normal examined duodenum.                           - The examination was otherwise normal. Recommendation:           - Patient has a contact number available for                            emergencies. The signs and symptoms of potential                            delayed complications were discussed with the                            patient. Return to normal activities tomorrow.                            Written discharge instructions were provided to the                            patient.                           - Resume previous diet today.                           - Continue present medications. Continue Protonix.                           - Await pathology results.                           - Return to GI office. [name] MD, MD 02/20/2019 10:03:48 AM This report has been signed  electronically.

## 2019-02-20 NOTE — Progress Notes (Addendum)
LATE ENTRY 0855--Epic system was down, paper charting was instituted, VS were taken upon arrival, written on paper that was discarded in recovery, Pt VS were stable prior to procedure, pt denies any changes to her medications or medical history since pre-visit with our nurse on 02/18/19.

## 2019-02-20 NOTE — Telephone Encounter (Signed)
Pt. Called to report she has noted a small amount of blood following her colonoscopy.  She has "it wasn't much".  Pt. Reports some abdominal cramping.  Told pt. It was probably trapped air.  Told pt. To have something hot to drink, walk around, and rock back and forth on all fours to facilitate the air coming out.   Will call pt. Back again later this afternoon to check on her.

## 2019-02-20 NOTE — Telephone Encounter (Signed)
Pt states she is having no further discomfort or bleeding at this time.  She is tolerating food without difficulty

## 2019-02-20 NOTE — Op Note (Signed)
[facility] Patient Name: Gloria Rodriguez Procedure Date: 02/20/2019 9:02 AM MRN: [id number] Endoscopist: [name] MD, MD Age: 43 Referring MD:  Date of Birth: December 15, 1975 Gender: Female Account #: [id number] Procedure:                Colonoscopy Indications:              Chronic diarrhea Medicines:                See the Anesthesia note for documentation of the                            administered medications Procedure:                Pre-Anesthesia Assessment:                           - Prior to the procedure, a History and Physical                            was performed, and patient medications and                            allergies were reviewed. The patient's tolerance of                            previous anesthesia was also reviewed. The risks                            and benefits of the procedure and the sedation                            options and risks were discussed with the patient.                            All questions were answered, and informed consent                            was obtained. Prior Anticoagulants: The patient has                            taken no previous anticoagulant or antiplatelet                            agents. ASA Grade Assessment: II - A patient with                            mild systemic disease. After reviewing the risks                            and benefits, the patient was deemed in                            satisfactory condition to undergo the procedure.  After obtaining informed consent, the colonoscope                            was passed under direct vision. Throughout the                            procedure, the patient's blood pressure, pulse, and                            oxygen saturations were monitored continuously. The                            Colonoscope was introduced through the anus and                            advanced to the the terminal ileum, with                        identification of the appendiceal orifice and IC                            valve. The colonoscopy was performed without                            difficulty. The patient tolerated the procedure                            well. The quality of the bowel preparation was                            good. The terminal ileum, ileocecal valve,                            appendiceal orifice, and rectum were photographed. Scope In: 9:31:59 AM Scope Out: 9:47:59 AM Scope Withdrawal Time: 0 hours 12 minutes 37 seconds  Total Procedure Duration: 0 hours 16 minutes 0 seconds  Findings:                 The perianal and digital rectal examinations were                            normal. Small internal hemorrhoids are present.                           The colon (entire examined portion) appeared                            normal. Biopsies for histology were taken with a                            cold forceps from the entire colon for evaluation                            of microscopic colitis. Estimated blood loss was  minimal.                           The exam was otherwise without abnormality on                            direct and retroflexion views. Complications:            No immediate complications. Estimated blood loss:                            Minimal. Estimated Blood Loss:     Estimated blood loss was minimal. Impression:               - The entire examined colon is normal. Biopsied.                           - The examination was otherwise normal on direct                            and retroflexion views. Recommendation:           - Patient has a contact number available for                            emergencies. The signs and symptoms of potential                            delayed complications were discussed with the                            patient. Return to normal activities tomorrow.                            Written discharge  instructions were provided to the                            patient.                           - Resume regular diet.                           - Continue present medications.                           - Await pathology results.                           - Repeat colonoscopy in 10 years for screening                            purposes.                           - Return to GI office to review these results. [name] MD, MD 02/20/2019 10:06:30 AM This report has been signed electronically.

## 2019-02-20 NOTE — Telephone Encounter (Signed)
Pt had a procedure endo/colon this morning with [name]. She is experiencing blood in her stool and would like to speak to some one.

## 2019-02-20 NOTE — Progress Notes (Signed)
Recovery room information entered late.  System wide Epic was down.  Gave pt. A copy of low-fodmap diet, as her current copy getting worn.  [name] recommended for the present she continue to follow those recommendations in addition to what was recommended in the procedure report.

## 2019-02-21 ENCOUNTER — Telehealth: Payer: Self-pay

## 2019-02-21 NOTE — Telephone Encounter (Signed)
Thank you for the update. I agree with your recommendations. I hope she starts feeling better. I told her that we would call when we had the biopsy results.

## 2019-02-21 NOTE — Telephone Encounter (Signed)
  Follow up Call-  Call back number 02/20/2019  Post procedure Call Back phone  # [phone]  Permission to leave phone message Yes  Some recent data might be hidden     Patient questions:  Do you have a fever, pain , or abdominal swelling? Yes.   Pain Score  4 *  Have you tolerated food without any problems? Yes.    Have you been able to return to your normal activities? Yes.    Do you have any questions about your discharge instructions: Diet   No. Medications  No. Follow up visit  No.  Do you have questions or concerns about your Care? No.  Actions: * If pain score is 4 or above: Physician/ provider Notified : Joelene Millin L. Tarri Glenn, MD.

## 2019-02-24 NOTE — Progress Notes (Unsigned)
  Office: [phone]  /  Fax: [phone]    Date: 02/27/2019   Time Seen:*** Duration:*** Provider: Glennie Isle, Psy.D. Type of Session: Individual Therapy  Type of Contact: Face-to-face  Session Content: Gloria Rodriguez is a 43 y.o. female presenting for a follow-up appointment to address the previously established treatment goal of decreasing emotional eating. The session was initiated with the administration of the PHQ-9 and GAD-7, as well as a brief check-in. *** Gloria Rodriguez was receptive to today's session as evidenced by openness to sharing, responsiveness to feedback, and ***.  Mental Status Examination: Gloria Rodriguez arrived on time for the appointment. She presented as appropriately dressed and groomed. Gloria Rodriguez appeared her stated Rodriguez and demonstrated adequate orientation to time, place, person, and purpose of the appointment. She also demonstrated appropriate eye contact. No psychomotor abnormalities or behavioral peculiarities noted. Her mood was {gbmood:21757} with congruent affect. Her thought processes were logical, linear, and goal-directed. No hallucinations, delusions, bizarre thinking or behavior reported or observed. Judgment, insight, and impulse control appeared to be grossly intact. There was no evidence of paraphasias (i.e., errors in speech, gross mispronunciations, and word substitutions), repetition deficits, or disturbances in volume or prosody (i.e., rhythm and intonation). There was no evidence of attention or memory impairments. Gloria Rodriguez denied current suicidal and homicidal ideation, plan and intent.   Structured Assessment Results: The Patient Health Questionnaire-9 (PHQ-9) is a self-report measure that assesses symptoms and severity of depression over the course of the last two weeks. Gloria Rodriguez obtained a score of *** suggesting {GBPHQ9SEVERITY:21752}. Gloria Rodriguez finds the endorsed symptoms to be {gbphq9difficulty:21754}.  The Generalized Anxiety Disorder-7 (GAD-7) is a brief self-report measure that  assesses symptoms of anxiety over the course of the last two weeks. Gloria Rodriguez obtained a score of *** suggesting {gbgad7severity:21753}.  Interventions:  {Interventions:22172}  DSM-5 Diagnosis: 311 (F32.8) Other Specified Depressive Disorder, Emotional Eating BehaviorsandBipolar affective disorder, remission status unspecified(per chart review)  Treatment Goal & Progress: During the initial appointment with this provider, the following treatment goal was established: decrease emotional eating. Gloria Rodriguez has demonstrated progress in her goal as evidenced by ***  Plan: Gloria Rodriguez continues to appear able and willing to participate as evidenced by engagement in reciprocal conversation, and asking questions for clarification as appropriate.*** The next appointment will be scheduled in {gbweeks:21758}. The next session will focus on reviewing learned skills, and working towards the established treatment goal.***

## 2019-02-27 ENCOUNTER — Ambulatory Visit (INDEPENDENT_AMBULATORY_CARE_PROVIDER_SITE_OTHER): Payer: Self-pay | Admitting: Psychology

## 2019-02-28 ENCOUNTER — Other Ambulatory Visit: Payer: Self-pay | Admitting: Gastroenterology

## 2019-03-04 ENCOUNTER — Encounter: Payer: Self-pay | Admitting: Gastroenterology

## 2019-03-05 ENCOUNTER — Other Ambulatory Visit (INDEPENDENT_AMBULATORY_CARE_PROVIDER_SITE_OTHER): Payer: Self-pay | Admitting: Physician Assistant

## 2019-03-05 DIAGNOSIS — E559 Vitamin D deficiency, unspecified: Secondary | ICD-10-CM

## 2019-03-06 ENCOUNTER — Encounter (INDEPENDENT_AMBULATORY_CARE_PROVIDER_SITE_OTHER): Payer: Self-pay

## 2019-03-08 ENCOUNTER — Other Ambulatory Visit: Payer: Self-pay | Admitting: Gastroenterology

## 2019-03-13 ENCOUNTER — Ambulatory Visit (INDEPENDENT_AMBULATORY_CARE_PROVIDER_SITE_OTHER): Payer: Self-pay | Admitting: Physician Assistant

## 2019-03-13 ENCOUNTER — Other Ambulatory Visit: Payer: Self-pay

## 2019-03-13 ENCOUNTER — Ambulatory Visit (INDEPENDENT_AMBULATORY_CARE_PROVIDER_SITE_OTHER): Payer: Medicare HMO | Admitting: Gastroenterology

## 2019-03-13 ENCOUNTER — Encounter: Payer: Self-pay | Admitting: Gastroenterology

## 2019-03-13 DIAGNOSIS — R197 Diarrhea, unspecified: Secondary | ICD-10-CM | POA: Diagnosis not present

## 2019-03-13 DIAGNOSIS — K219 Gastro-esophageal reflux disease without esophagitis: Secondary | ICD-10-CM | POA: Diagnosis not present

## 2019-03-13 DIAGNOSIS — E739 Lactose intolerance, unspecified: Secondary | ICD-10-CM | POA: Diagnosis not present

## 2019-03-13 MED ORDER — RIFAXIMIN 550 MG PO TABS: 550.0000 mg | ORAL_TABLET | Freq: Three times a day (TID) | ORAL | 0 refills | Status: DC

## 2019-03-13 NOTE — Progress Notes (Signed)
TELEHEALTH VISIT  Referring Provider: Leonard Downing, * Primary Care Physician:  Leonard Downing, MD   Tele-visit due to COVID-19 pandemic Patient requested visit virtually, consented to the virtual encounter via WebEx WebEx technology was not sustainable due to technical difficulties and the remainder of the appointment was converted to a telephone visit Contact made at: 10:05am 03/13/19 Patient verified by name and date of birth Location of patient: Home Location provider: My medical office Names of persons participating: Me, patient, Tinnie Gens CMA Time spent on telehealth visit: 35 minutes  Chief complaint:  Ongoing diarrhea    IMPRESSION:  Diarrhea aggravated by fatty meals - ? Bile acid diarrhea +/- diarrhea-predominant IBS    - developed after cholecystectomy    - gets constipated with almost all antidiarrheal agents    - insurance refused Questran    - Cholestipol resulting in constipation followed by worsened diarrhea    - normal TSH 01/01/19 3.440    - colonoscopy 02/20/19: normal random biospies GERD, adequately controlled on pantroprazole    - previously on Nexium 40 mg daily x 7 years    - EGD 02/20/19: esophageal biopsies showed mild vascular congestion, squamous ballooning and surface parakeratosis        - biopsies negative for eosinophilic esophagitis Lactose intolerance Cholecystectomy for cholelithiasis 2012 Obesity with BMI of 43 Internal hemorrhoids on colononoscopy for bleeding prior to 2007 No polyps on colonoscopy 02/20/19    - screening colonoscopy due 2030 No family history of colon cancer or polyps  Reviewed recent pathology results with the patient. Suspected diarrhea-IBS +/- bile acid diarrhea. Symptoms not adequately controlled on Cholestipol. No alarm features. Trial of Xifaxan recommended. Extensive discussion with the patient about the potential side effects of treatment.   GERD is adequately controlled at this time.   PLAN:  Continue hyoscyamine - may increase the dose to QID as needed Continue the low FODMAP diet Trial of Xifaxan 550mg  TID x 14 days Continue pantoprazole 40 mg daily If no response to Xifaxan or it is cost prohibitive: stool for fecal calprotectin, giardia; celiac panel Follow-up in 2-3 months of earlier as needed (30 minute appointment) Screening colonoscopy due 2030    HPI: Gloria Rodriguez is a 43 y.o. female with ongoing diarrhea. The interval history is obtained through the patient and review of her electronic health record.  She had endoscopic evaluation 02/20/19. Colonoscopy was normal except for internal hemorrhoids. Random colon biopsies were normal. EGD showed LA Class A reflux esophagitis. Biopsies showed mild vascular congestion, squamous ballooning and surface parakeratosis. There was no evidence for eosinophilic esophagitis.  Feeling better after the colonoscopy. Thought it "emptied" her out. Eating only soft foods - oatmeal, yogurt, cereal with soy milk. She is also strictly  following the FODMAP diet. As she started to eat more food her diarrhea returns. For example, she ate salmon and felt that things drastically worsened. Used hyoscyamine and that helped with the cramping. Took cholestipol for the exacerbation of diarrhea but that ultimately constipated her, and then she had extensive diarrhea after she ultimately had a bowel movement. Craving apples and carbohydrates. Having 1-3 times daily although now she feels a little backed up before the BM actually start.   Asking about Xifaxan, which she heard about through our office. But very anxious about her prior history of difficulty taking antibiotics and the safety profile. Weight is stable. Appetite is good. No new symptoms. No new complaints or concerns.   I reviewed Epic to  screen for other causes of diarrhea beyond bile acid diarrhea. TSH was normal 01/01/19. O&P screen negative 2916, No electrolyte abnormalities noted.    Past  Medical History:  Diagnosis Date  . ADD (attention deficit disorder)   . Allergy    allergy meds daily   . Anxiety    hx bipolar, ADHD  . Arthritis    left shoulder   . Bruxism   . Chronic kidney disease    kindney stones, interstitial cystitis history  . Chronic migraine   . Depression    states well controlled  . Diarrhea   . GERD (gastroesophageal reflux disease)    hx gallstones and pancreatitis per pt  . Headache(784.0)   . HSV infection    on Valtrex suppression  . Hypotension    normal rens 80/60  . Kidney problem   . Lactose intolerance   . No pertinent past medical history    states with gall bladder attacks with chest pain, vomiting and diarrhes    EKG  in EPIC  . OCD (obsessive compulsive disorder)   . Periodic limb movement   . Postpartum care and examination of lactating mother 07/13/2011  . Sleep apnea    NO SLEEP APNEA BUT STATES HAS PERIODIC LIMB MOVEMENT DISORDER_ WAS IN MIRAPEX PRE PREGNANCY  . Sleep disorder 03/03/2014  . SOB (shortness of breath)   . Swelling     Past Surgical History:  Procedure Laterality Date  . CESAREAN SECTION    . CESAREAN SECTION  07/11/2011   Procedure: CESAREAN SECTION;  Surgeon: Princess Bruins;  Location: [facility];  Service: Gynecology;  Laterality: N/A;  Repeat  . CHOLECYSTECTOMY  11/14/2011   Procedure: LAPAROSCOPIC CHOLECYSTECTOMY WITH INTRAOPERATIVE CHOLANGIOGRAM;  Surgeon: Joyice Faster. Cornett, MD;  Location: [facility];  Service: General;  Laterality: N/A;  Laparoscopic Cholecystectomy with cholangiogram, c-arm  . COLONOSCOPY    . LASIK     2007  . TEE WITHOUT CARDIOVERSION N/A 11/18/2013   Procedure: TRANSESOPHAGEAL ECHOCARDIOGRAM (TEE);  Surgeon: Laverda Page, MD;  Location: [facility];  Service: Cardiovascular;  Laterality: N/A;  . WISDOM TOOTH EXTRACTION      Current Outpatient Medications  Medication Sig Dispense Refill  . ALPRAZolam (XANAX) 0.25 MG tablet Take 0.25 mg by mouth as needed for anxiety.    Marland Kitchen  amphetamine-dextroamphetamine (ADDERALL XR) 30 MG 24 hr capsule Take 30 mg by mouth daily with breakfast.     . amphetamine-dextroamphetamine (ADDERALL) 10 MG tablet Take 10 mg by mouth as needed.    . AZO-CRANBERRY PO Take by mouth as needed.    . cetirizine (ZYRTEC) 10 MG tablet Take 10 mg by mouth 2 (two) times daily.     . clomiPRAMINE (ANAFRANIL) 75 MG capsule Take 150 mg by mouth at bedtime.  3  . Hyoscyamine Sulfate SL (LEVSIN/SL) 0.125 MG SUBL Dissolve 1 tablet on the tongue every 6 hours as needed for cramping, diarrhea. 60 each 6  . lamoTRIgine (LAMICTAL) 150 MG tablet Take 150 mg by mouth daily.    Marland Kitchen levonorgestrel (MIRENA) 20 MCG/24HR IUD 1 each by Intrauterine route once.    . methocarbamol (ROBAXIN) 500 MG tablet TAKE 1 TABLET TWICE A DAY AS NEEDED, LIMIT 1-2 TREATMENTS PER WEEK  0  . nitrofurantoin, macrocrystal-monohydrate, (MACROBID) 100 MG capsule Take 100 mg by mouth as needed.    . ondansetron (ZOFRAN) 4 MG tablet Take 1 tablet (4 mg total) by mouth every 6 (six) hours as needed for nausea or vomiting. Athalia  tablet 1  . OVER THE COUNTER MEDICATION Trigger point injections in the scalp of  decadron , Lidocaine, marcaine -for chronic migraines as needed  Gets very 2 months    . pantoprazole (PROTONIX) 40 MG tablet TAKE 30- 60 MIN BEFORE YOUR FIRST AND LAST MEALS OF THE DAY 180 tablet 0  . pramipexole (MIRAPEX) 1 MG tablet Take 1 mg by mouth at bedtime.    . promethazine (PHENERGAN) 25 MG tablet Take 25 mg by mouth every 6 (six) hours as needed for nausea or vomiting (for migraine headaches).     . sertraline (ZOLOFT) 100 MG tablet Take 100 mg by mouth every morning.     . valACYclovir (VALTREX) 1000 MG tablet Take 1,000 mg by mouth as needed.    . Vitamin D, Ergocalciferol, (DRISDOL) 1.25 MG (50000 UT) CAPS capsule Take 1 capsule (50,000 Units total) by mouth every 7 (seven) days. 4 capsule 0  . zonisamide (ZONEGRAN) 100 MG capsule Take 2 capsules by mouth daily.   1  . colestipol  (COLESTID) 1 g tablet TAKE 2 TABLETS (2 G TOTAL) BY MOUTH 2 (TWO) TIMES DAILY. (Patient not taking: Reported on 03/13/2019) 60 tablet 1   No current facility-administered medications for this visit.     Allergies as of 03/13/2019 - Review Complete 03/13/2019  Allergen Reaction Noted  . Other Itching and Other (See Comments) 11/01/2011  . Codeine Other (See Comments) 07/11/2011  . Hydrocodone Other (See Comments) 07/11/2011  . Oxycodone Other (See Comments) 07/11/2011  . Ultram [tramadol hcl] Other (See Comments) 07/11/2011  . Vicodin [hydrocodone-acetaminophen] Other (See Comments) 07/11/2011    Family History  Problem Relation Age of Onset  . Hypertension Mother   . Hyperlipidemia Mother   . Thyroid disease Mother   . Depression Mother   . Anxiety disorder Mother   . Obesity Mother   . Heart disease Maternal Grandmother   . Prostate cancer Maternal Grandfather   . Stomach cancer Neg Hx   . Rectal cancer Neg Hx   . Colon cancer Neg Hx   . Colon polyps Neg Hx   . Esophageal cancer Neg Hx     Social History   Socioeconomic History  . Marital status: Married    Spouse name: Shanaia Sievers  . Number of children: 2  . Years of education: Not on file  . Highest education level: Not on file  Occupational History  . Occupation: Disabled  Social Needs  . Financial resource strain: Not on file  . Food insecurity:    Worry: Not on file    Inability: Not on file  . Transportation needs:    Medical: Not on file    Non-medical: Not on file  Tobacco Use  . Smoking status: Never Smoker  . Smokeless tobacco: Never Used  . Tobacco comment: smoked a few cigs  in college at a bar but never smoked for a year straight   Substance and Sexual Activity  . Alcohol use: Not Currently    Alcohol/week: 1.0 standard drinks    Types: 1 Cans of beer per week    Comment: socially -none in 1 1/2 years   . Drug use: Not Currently    Types: Other-see comments, Marijuana    Comment: in college  .  Sexual activity: Yes    Birth control/protection: I.U.D.  Lifestyle  . Physical activity:    Days per week: Not on file    Minutes per session: Not on file  . Stress: Not  on file  Relationships  . Social connections:    Talks on phone: Not on file    Gets together: Not on file    Attends religious service: Not on file    Active member of club or organization: Not on file    Attends meetings of clubs or organizations: Not on file    Relationship status: Not on file  . Intimate partner violence:    Fear of current or ex partner: Not on file    Emotionally abused: Not on file    Physically abused: Not on file    Forced sexual activity: Not on file  Other Topics Concern  . Not on file  Social History Narrative  . Not on file    Review of Systems: ALL ROS discussed and all others negative except listed in HPI.  Physical Exam: General: in no acute distress Neuro: Alert and appropriate Psych: Normal affect and normal insight Exam limited due to [employer] L. Tarri Glenn, MD, MPH [facility] 03/13/2019, 10:01 AM

## 2019-03-13 NOTE — Patient Instructions (Addendum)
Continue hyoscyamine. You may increase the dose to four times daily as needed.  Continue the low FODMAP diet  I am recommending Xifaxan 550mg  TID x 14 days, a treatment that is approved by the FDA for IBS.  We have sent your demographic information and a prescription for Xifaxan to [facility]. This pharmacy is able to get medication approved through insurance and get you the lowest copay possible. If you have not heard from them within 1 week, please call our office at [phone] to let us know.   Let's touch base in a couple of months, or earlier as needed.  Thank you for your patience with me and our technology today! Please stay home, safe, and healthy.

## 2019-03-18 ENCOUNTER — Telehealth (INDEPENDENT_AMBULATORY_CARE_PROVIDER_SITE_OTHER): Payer: Self-pay | Admitting: Psychology

## 2019-03-18 NOTE — Telephone Encounter (Signed)
  Office: [phone]  /  Fax: [phone]  Date of Call: March 18, 2019 Time of Call: 3:51pm Provider: Glennie Isle, PsyD  CONTENT: This provider called [facility] to check-in and schedule an appointment, as the last appointment was canceled due to childcare and COVID-19 concerns. A HIPAA compliant voicemail was left requesting a call back.   PLAN: This provider will wait for Kiely to call back or send a MyChart message to schedule an appointment.

## 2019-03-20 ENCOUNTER — Ambulatory Visit: Payer: Medicare HMO | Admitting: Psychology

## 2019-03-21 ENCOUNTER — Other Ambulatory Visit: Payer: Self-pay | Admitting: Gastroenterology

## 2019-03-24 ENCOUNTER — Other Ambulatory Visit: Payer: Self-pay

## 2019-03-24 DIAGNOSIS — R197 Diarrhea, unspecified: Secondary | ICD-10-CM

## 2019-03-26 ENCOUNTER — Telehealth (INDEPENDENT_AMBULATORY_CARE_PROVIDER_SITE_OTHER): Payer: Self-pay | Admitting: Psychology

## 2019-03-26 NOTE — Telephone Encounter (Signed)
  Office: [phone]  /  Fax: [phone]  Date of Call: March 26, 2019 Time of Call: 10:26am Provider: Glennie Isle, PsyD  CONTENT: This provider was informed by the practice's administrator Cira Servant) via e-mail that Ileene was attempting to connect with this provider. As such, this provider called [facility] and a HIPAA compliant voicemail was left requesting a call back.   PLAN: This provider will wait for Rashia to call back.

## 2019-04-03 ENCOUNTER — Encounter (INDEPENDENT_AMBULATORY_CARE_PROVIDER_SITE_OTHER): Payer: Self-pay | Admitting: Physician Assistant

## 2019-04-03 NOTE — Telephone Encounter (Signed)
Please review

## 2019-04-06 ENCOUNTER — Other Ambulatory Visit (INDEPENDENT_AMBULATORY_CARE_PROVIDER_SITE_OTHER): Payer: Self-pay | Admitting: Physician Assistant

## 2019-04-06 DIAGNOSIS — E559 Vitamin D deficiency, unspecified: Secondary | ICD-10-CM

## 2019-04-10 ENCOUNTER — Other Ambulatory Visit: Payer: Medicare HMO

## 2019-04-10 DIAGNOSIS — R197 Diarrhea, unspecified: Secondary | ICD-10-CM | POA: Diagnosis not present

## 2019-04-10 NOTE — Addendum Note (Signed)
Addended by: Boris Lown B on: 04/10/2019 10:05 AM   Modules accepted: Orders

## 2019-04-12 LAB — GLIA (IGA/G) + TTG IGA
Antigliadin Abs, IgA: 5 units (ref 0–19)
Gliadin IgG: 4 units (ref 0–19)
Transglutaminase IgA: <2 U/mL (ref 0–3)

## 2019-04-17 ENCOUNTER — Other Ambulatory Visit: Payer: Medicare HMO

## 2019-04-17 DIAGNOSIS — R197 Diarrhea, unspecified: Secondary | ICD-10-CM | POA: Diagnosis not present

## 2019-04-18 LAB — GIARDIA ANTIGEN
MICRO NUMBER:: 454160
RESULT:: NOT DETECTED
SPECIMEN QUALITY:: ADEQUATE

## 2019-04-19 ENCOUNTER — Other Ambulatory Visit (INDEPENDENT_AMBULATORY_CARE_PROVIDER_SITE_OTHER): Payer: Self-pay | Admitting: Physician Assistant

## 2019-04-19 DIAGNOSIS — E559 Vitamin D deficiency, unspecified: Secondary | ICD-10-CM

## 2019-04-21 ENCOUNTER — Other Ambulatory Visit: Payer: Self-pay | Admitting: *Deleted

## 2019-04-21 ENCOUNTER — Telehealth: Payer: Self-pay | Admitting: *Deleted

## 2019-04-21 LAB — CALPROTECTIN, FECAL

## 2019-04-21 MED ORDER — SULFAMETHOXAZOLE-TRIMETHOPRIM 800-160 MG PO TABS: 1.0000 | ORAL_TABLET | Freq: Two times a day (BID) | ORAL | 0 refills | Status: DC

## 2019-04-21 NOTE — Telephone Encounter (Signed)
Spoke to the patient who still has questions concerning her GI issues, mainly her extremely limited diet and intermittent diarrhea. This RN reported to the patient the results of her stool studies and this did not satisfy the patient. The patient is inquiring about other diagnostic testing to point to a resolution of her ongoing problems (limited diet intake, intermittent diarrhea and intense abd cramping when eating anything other that lowfat yogurt and oatmeal). The patient states she cannot eat any meat, veggies are too fibrous, citrus fruits flare up her interstitial cystitis, legumes cause diarrhea. Please advise.   FYI: patient had a colon, upper endoscopy on 3/12 along with a host of laboratory studies

## 2019-04-21 NOTE — Telephone Encounter (Signed)
She could try an alternative antibiotic, that is cheaper than the Xifaxan: Septra DS BID x 14 days. Alternative would be a referral to Dr. Hardie Pulley to test for food allergies. Or, both.  Thanks.

## 2019-04-22 NOTE — Telephone Encounter (Signed)
On 5/11, this RN called the patient to notify patient of referral to allergist Dr. Tiajuana Amass at [facility]. This RN called [facility] [phone] to notify staff of referral from [name]. Septra DS script sent to pharmacy. Patient information faxed today [phone].

## 2019-04-28 ENCOUNTER — Other Ambulatory Visit: Payer: Self-pay | Admitting: Physician Assistant

## 2019-04-28 ENCOUNTER — Encounter (INDEPENDENT_AMBULATORY_CARE_PROVIDER_SITE_OTHER): Payer: Self-pay | Admitting: Physician Assistant

## 2019-04-29 ENCOUNTER — Other Ambulatory Visit: Payer: Self-pay

## 2019-04-29 ENCOUNTER — Ambulatory Visit (INDEPENDENT_AMBULATORY_CARE_PROVIDER_SITE_OTHER): Payer: Medicare HMO | Admitting: Physician Assistant

## 2019-04-29 ENCOUNTER — Encounter (INDEPENDENT_AMBULATORY_CARE_PROVIDER_SITE_OTHER): Payer: Self-pay | Admitting: Physician Assistant

## 2019-04-29 DIAGNOSIS — E66813 Obesity, class 3: Secondary | ICD-10-CM

## 2019-04-29 DIAGNOSIS — Z6841 Body Mass Index (BMI) 40.0 and over, adult: Secondary | ICD-10-CM | POA: Diagnosis not present

## 2019-04-29 DIAGNOSIS — E88819 Insulin resistance, unspecified: Secondary | ICD-10-CM

## 2019-04-29 DIAGNOSIS — E8881 Metabolic syndrome: Secondary | ICD-10-CM | POA: Diagnosis not present

## 2019-04-29 DIAGNOSIS — E559 Vitamin D deficiency, unspecified: Secondary | ICD-10-CM

## 2019-04-29 MED ORDER — VITAMIN D (ERGOCALCIFEROL) 1.25 MG (50000 UNIT) PO CAPS: 50000.0000 [IU] | ORAL_CAPSULE | ORAL | 0 refills | Status: DC

## 2019-04-29 NOTE — Progress Notes (Signed)
Office: [phone]  /  Fax: [phone] TeleHealth Visit:  Jerry Caras Hodgman has verbally consented to this TeleHealth visit today. The patient is located at home, the provider is located at the [facility] office. The participants in this visit include the listed provider and patient. The visit was conducted today via FaceTime.  HPI:   Chief Complaint: OBESITY Siani is here to discuss her progress with her obesity treatment plan. She is on the Category 3 plan or journaling 1500-1800 calories + 95-100 grams of protein daily and is following her eating plan approximately 75% of the time. Shestates she is exercising 0 minutes 0 times per week. Tabbatha reports that she is having a difficult time following the plan due to continued GI issues. She found out from GI that she is gluten and lactose intolerant. She states she has stopped journaling. We were unable to weigh the patient today for this TeleHealth visit. She is unsure if she has lost or gained weight since her last visit. She has lost 6 lbs since starting treatment with [place].  Vitamin D deficiency Trystan has a diagnosis of Vitamin D deficiency. She is currently taking prescription Vit D and denies nausea, vomiting or muscle weakness.  Insulin Resistance Omni has a diagnosis of insulin resistance based on her elevated fasting insulin level >5. Although Elizabeth's blood glucose readings are still under good control, insulin resistance puts her at greater risk of metabolic syndrome and diabetes. She is not taking metformin currently and continues to work on diet and exercise to decrease risk of diabetes.  No polyphagia.  ASSESSMENT AND PLAN:  Vitamin D deficiency - Plan: Vitamin D, Ergocalciferol, (DRISDOL) 1.25 MG (50000 UT) CAPS capsule  Insulin resistance  Class 3 severe obesity with serious comorbidity and body mass index (BMI) of 40.0 to 44.9 in adult, unspecified obesity type ([place])  PLAN:  Vitamin D Deficiency Mellisa was  informed that low Vitamin D levels contributes to fatigue and are associated with obesity, breast, and colon cancer. She agrees to continue to take prescription Vit D @ 50,000 IU every week #4 with 0 refills and will follow-up for routine testing of Vitamin D, at least 2-3 times per year. She was informed of the risk of over-replacement of Vitamin D and agrees to not increase her dose unless she discusses this with [place] first. Jennise agrees to follow-up with our clinic in 2 weeks.  Insulin Resistance Erisha will continue to work on weight loss, exercise, and decreasing simple carbohydrates in her diet to help decrease the risk of diabetes. We dicussed metformin including benefits and risks. She was informed that eating too many simple carbohydrates or too many calories at one sitting increases the likelihood of GI side effects. Shaquetta will continue with weight loss and follow-up with [place] as directed to monitor her progress.  Obesity Kylar is currently in the action stage of change. As such, her goal is to continue with weight loss efforts. She has agreed to keep a food journal with 1500-1800 calories and 95 grams of protein daily. Kailani has been instructed to work up to a goal of 150 minutes of combined cardio and strengthening exercise per week for weight loss and overall health benefits. We discussed the following Behavioral Modification Strategies today: increasing lean protein intake, work on meal planning and easy cooking plans.  Gianelle has agreed to follow-up with our clinic in 2 weeks. She was informed of the importance of frequent follow-up visits to maximize her success with  intensive lifestyle modifications for her multiple health conditions.  ALLERGIES: Allergies  Allergen Reactions  . Other Itching and Other (See Comments)    Any pain med that is in a tablet form. Causes Itching, vomiting,migraine- is the sodium binding in the tablet form   . Codeine Other (See Comments)    migraines  .  Hydrocodone Other (See Comments)    migraines  . Oxycodone Other (See Comments)    migraines  . Ultram [Tramadol Hcl] Other (See Comments)    Migraines/nightmares/ STATES ANY SODIUM BINDING TABLET CAUSES ALLERGIC REACTION  . Vicodin [Hydrocodone-Acetaminophen] Other (See Comments)    migraines    MEDICATIONS: Current Outpatient Medications on File Prior to Visit  Medication Sig Dispense Refill  . ALPRAZolam (XANAX) 0.25 MG tablet Take 0.25 mg by mouth as needed for anxiety.    Marland Kitchen amphetamine-dextroamphetamine (ADDERALL XR) 30 MG 24 hr capsule Take 30 mg by mouth daily with breakfast.     . amphetamine-dextroamphetamine (ADDERALL) 10 MG tablet Take 10 mg by mouth as needed.    . AZO-CRANBERRY PO Take by mouth as needed.    . cetirizine (ZYRTEC) 10 MG tablet Take 10 mg by mouth 2 (two) times daily.     . clomiPRAMINE (ANAFRANIL) 75 MG capsule Take 150 mg by mouth at bedtime.  3  . colestipol (COLESTID) 1 g tablet TAKE 2 TABLETS (2 G TOTAL) BY MOUTH 2 (TWO) TIMES DAILY. 180 tablet 1  . hyoscyamine (LEVSIN SL) 0.125 MG SL tablet DISSOLVE 1 TABLET ON THE TONGUE EVERY 6 HOURS AS NEEDED FOR CRAMPING, DIARRHEA. 60 tablet 1  . lamoTRIgine (LAMICTAL) 150 MG tablet Take 150 mg by mouth daily.    Marland Kitchen levonorgestrel (MIRENA) 20 MCG/24HR IUD 1 each by Intrauterine route once.    . methocarbamol (ROBAXIN) 500 MG tablet TAKE 1 TABLET TWICE A DAY AS NEEDED, LIMIT 1-2 TREATMENTS PER WEEK  0  . nitrofurantoin, macrocrystal-monohydrate, (MACROBID) 100 MG capsule Take 100 mg by mouth as needed.    . ondansetron (ZOFRAN) 4 MG tablet Take 1 tablet (4 mg total) by mouth every 6 (six) hours as needed for nausea or vomiting. 30 tablet 1  . OVER THE COUNTER MEDICATION Trigger point injections in the scalp of  decadron , Lidocaine, marcaine -for chronic migraines as needed  Gets very 2 months    . pantoprazole (PROTONIX) 40 MG tablet TAKE 30- 60 MIN BEFORE YOUR FIRST AND LAST MEALS OF THE DAY 180 tablet 0  .  pramipexole (MIRAPEX) 1 MG tablet Take 1 mg by mouth at bedtime.    . promethazine (PHENERGAN) 25 MG tablet Take 25 mg by mouth every 6 (six) hours as needed for nausea or vomiting (for migraine headaches).     . rifaximin (XIFAXAN) 550 MG TABS tablet Take 1 tablet (550 mg total) by mouth 3 (three) times daily. 42 tablet 0  . sertraline (ZOLOFT) 100 MG tablet Take 100 mg by mouth every morning.     . sulfamethoxazole-trimethoprim (BACTRIM DS) 800-160 MG tablet Take 1 tablet by mouth 2 (two) times daily for 14 days. 28 tablet 0  . valACYclovir (VALTREX) 1000 MG tablet Take 1,000 mg by mouth as needed.    . zonisamide (ZONEGRAN) 100 MG capsule Take 2 capsules by mouth daily.   1   No current facility-administered medications on file prior to visit.     PAST MEDICAL HISTORY: Past Medical History:  Diagnosis Date  . ADD (attention deficit disorder)   . Allergy  allergy meds daily   . Anxiety    hx bipolar, ADHD  . Arthritis    left shoulder   . Bruxism   . Chronic kidney disease    kindney stones, interstitial cystitis history  . Chronic migraine   . Depression    states well controlled  . Diarrhea   . GERD (gastroesophageal reflux disease)    hx gallstones and pancreatitis per pt  . Headache(784.0)   . HSV infection    on Valtrex suppression  . Hypotension    normal rens 80/60  . Kidney problem   . Lactose intolerance   . No pertinent past medical history    states with gall bladder attacks with chest pain, vomiting and diarrhes    EKG  in [facility]  . OCD (obsessive compulsive disorder)   . Periodic limb movement   . Postpartum care and examination of lactating mother 07/13/2011  . Sleep apnea    NO SLEEP APNEA BUT STATES HAS PERIODIC LIMB MOVEMENT DISORDER_ WAS IN MIRAPEX PRE PREGNANCY  . Sleep disorder 03/03/2014  . SOB (shortness of breath)   . Swelling     PAST SURGICAL HISTORY: Past Surgical History:  Procedure Laterality Date  . CESAREAN SECTION    . CESAREAN  SECTION  07/11/2011   Procedure: CESAREAN SECTION;  Surgeon: Princess Bruins;  Location: [facility];  Service: Gynecology;  Laterality: N/A;  Repeat  . CHOLECYSTECTOMY  11/14/2011   Procedure: LAPAROSCOPIC CHOLECYSTECTOMY WITH INTRAOPERATIVE CHOLANGIOGRAM;  Surgeon: Joyice Faster. Cornett, MD;  Location: [facility];  Service: General;  Laterality: N/A;  Laparoscopic Cholecystectomy with cholangiogram, c-arm  . COLONOSCOPY    . LASIK     2007  . TEE WITHOUT CARDIOVERSION N/A 11/18/2013   Procedure: TRANSESOPHAGEAL ECHOCARDIOGRAM (TEE);  Surgeon: Laverda Page, MD;  Location: [facility];  Service: Cardiovascular;  Laterality: N/A;  . WISDOM TOOTH EXTRACTION      SOCIAL HISTORY: Social History   Tobacco Use  . Smoking status: Never Smoker  . Smokeless tobacco: Never Used  . Tobacco comment: smoked a few cigs  in college at a bar but never smoked for a year straight   Substance Use Topics  . Alcohol use: Not Currently    Alcohol/week: 1.0 standard drinks    Types: 1 Cans of beer per week    Comment: socially -none in 1 1/2 years   . Drug use: Not Currently    Types: Other-see comments, Marijuana    Comment: in college    FAMILY HISTORY: Family History  Problem Relation Age of Onset  . Hypertension Mother   . Hyperlipidemia Mother   . Thyroid disease Mother   . Depression Mother   . Anxiety disorder Mother   . Obesity Mother   . Heart disease Maternal Grandmother   . Prostate cancer Maternal Grandfather   . Stomach cancer Neg Hx   . Rectal cancer Neg Hx   . Colon cancer Neg Hx   . Colon polyps Neg Hx   . Esophageal cancer Neg Hx    ROS: Review of Systems  Gastrointestinal: Negative for nausea and vomiting.  Musculoskeletal:       Negative for muscle weakness.  Endo/Heme/Allergies:       Negative for polyphagia.   PHYSICAL EXAM: Pt in no acute distress  RECENT LABS AND TESTS: BMET    Component Value Date/Time   NA 138 01/01/2019 1315   K 4.2 01/01/2019 1315   CL 99  01/01/2019 1315   CO2  23 01/01/2019 1315   GLUCOSE 83 01/01/2019 1315   GLUCOSE 79 03/25/2015 1217   BUN 10 01/01/2019 1315   CREATININE 0.86 01/01/2019 1315   CREATININE 0.73 03/25/2015 1217   CALCIUM 9.3 01/01/2019 1315   GFRNONAA 83 01/01/2019 1315   GFRAA 96 01/01/2019 1315   Lab Results  Component Value Date   HGBA1C 5.5 01/01/2019   Lab Results  Component Value Date   INSULIN 7.2 01/01/2019   CBC    Component Value Date/Time   WBC 8.9 01/01/2019 1315   WBC 8.1 03/25/2015 1225   WBC 12.9 (H) 11/15/2011 0432   RBC 4.92 01/01/2019 1315   RBC 4.54 03/25/2015 1225   RBC 4.21 11/15/2011 0432   HGB 12.9 01/01/2019 1315   HCT 40.6 01/01/2019 1315   PLT 222 11/15/2011 0432   MCV 83 01/01/2019 1315   MCH 26.2 (L) 01/01/2019 1315   MCH 26.9 (A) 03/25/2015 1225   MCH 28.0 11/15/2011 0432   MCHC 31.8 01/01/2019 1315   MCHC 31.9 03/25/2015 1225   MCHC 32.4 11/15/2011 0432   RDW 14.8 01/01/2019 1315   LYMPHSABS 2.3 01/01/2019 1315   MONOABS 0.1 11/14/2011 1731   EOSABS 0.0 01/01/2019 1315   BASOSABS 0.1 01/01/2019 1315   Iron/TIBC/Ferritin/ %Sat No results found for: IRON, TIBC, FERRITIN, IRONPCTSAT Lipid Panel     Component Value Date/Time   CHOL 162 01/01/2019 1315   TRIG 129 01/01/2019 1315   HDL 66 01/01/2019 1315   LDLCALC 70 01/01/2019 1315   Hepatic Function Panel     Component Value Date/Time   PROT 7.2 01/01/2019 1315   ALBUMIN 4.4 01/01/2019 1315   AST 25 01/01/2019 1315   ALT 25 01/01/2019 1315   ALKPHOS 103 01/01/2019 1315   BILITOT 0.3 01/01/2019 1315      Component Value Date/Time   TSH 3.440 01/01/2019 1315   TSH 2.623 12/28/2014 1156   TSH 1.16 04/27/2014 1701   Results for TENASIA, AULL C (MRN [id number]) as of 04/29/2019 12:24  Ref. Range 01/01/2019 13:15  Vitamin D, 25-Hydroxy Latest Ref Range: 30.0 - 100.0 ng/mL 13.6 (L)   I, Michaelene Song, am acting as Location manager for [employer], PA-C I, Abby Potash, PA-C have reviewed above  note and agree with its content

## 2019-05-11 ENCOUNTER — Other Ambulatory Visit (INDEPENDENT_AMBULATORY_CARE_PROVIDER_SITE_OTHER): Payer: Self-pay | Admitting: Physician Assistant

## 2019-05-11 DIAGNOSIS — E559 Vitamin D deficiency, unspecified: Secondary | ICD-10-CM

## 2019-05-13 ENCOUNTER — Other Ambulatory Visit: Payer: Self-pay | Admitting: Internal Medicine

## 2019-05-13 DIAGNOSIS — G43719 Chronic migraine without aura, intractable, without status migrainosus: Secondary | ICD-10-CM | POA: Diagnosis not present

## 2019-05-13 DIAGNOSIS — G518 Other disorders of facial nerve: Secondary | ICD-10-CM | POA: Diagnosis not present

## 2019-05-13 DIAGNOSIS — M791 Myalgia, unspecified site: Secondary | ICD-10-CM | POA: Diagnosis not present

## 2019-05-13 DIAGNOSIS — M542 Cervicalgia: Secondary | ICD-10-CM | POA: Diagnosis not present

## 2019-05-14 ENCOUNTER — Ambulatory Visit (INDEPENDENT_AMBULATORY_CARE_PROVIDER_SITE_OTHER): Payer: Medicare HMO | Admitting: Physician Assistant

## 2019-05-15 ENCOUNTER — Other Ambulatory Visit: Payer: Self-pay

## 2019-05-15 ENCOUNTER — Encounter (INDEPENDENT_AMBULATORY_CARE_PROVIDER_SITE_OTHER): Payer: Self-pay | Admitting: Physician Assistant

## 2019-05-15 ENCOUNTER — Ambulatory Visit (INDEPENDENT_AMBULATORY_CARE_PROVIDER_SITE_OTHER): Payer: Medicare HMO | Admitting: Physician Assistant

## 2019-05-15 DIAGNOSIS — E559 Vitamin D deficiency, unspecified: Secondary | ICD-10-CM

## 2019-05-15 DIAGNOSIS — E8881 Metabolic syndrome: Secondary | ICD-10-CM

## 2019-05-15 DIAGNOSIS — Z6841 Body Mass Index (BMI) 40.0 and over, adult: Secondary | ICD-10-CM

## 2019-05-15 MED ORDER — VITAMIN D (ERGOCALCIFEROL) 1.25 MG (50000 UNIT) PO CAPS: 50000.0000 [IU] | ORAL_CAPSULE | ORAL | 0 refills | Status: DC

## 2019-05-15 NOTE — Progress Notes (Signed)
Office: [phone]  /  Fax: [phone] TeleHealth Visit:  Gloria Rodriguez has verbally consented to this TeleHealth visit today. The patient is located at home, the provider is located at the [employer] [facility] office. The participants in this visit include the listed provider and patient. The visit was conducted today via Webex.  HPI:   Chief Complaint: OBESITY Gloria Rodriguez is here to discuss her progress with her obesity treatment plan. She is keeping a food journal with 1500-1800 calories and 95 grams of protein and is following her eating plan approximately 90% of the time. She states she is exercising 0 minutes 0 times per week. Gloria Rodriguez's most recent weight was 252 lbs. She is now taking Septra DS from her GI doctor, which has resolved her diarrhea and abdominal pain. She is seeing a food allergy specialist next week. We were unable to weigh the patient today for this TeleHealth visit. She feels as if she has gained weight since her last visit. She has lost 6 lbs since starting treatment with [place].  Vitamin D deficiency Gloria Rodriguez has a diagnosis of Vitamin D deficiency. She is currently taking prescription Vit D and denies nausea, vomiting or muscle weakness.  Insulin Resistance Gloria Rodriguez has a diagnosis of insulin resistance based on her elevated fasting insulin level >5. Although Gloria Rodriguez's blood glucose readings are still under good control, insulin resistance puts her at greater risk of metabolic syndrome and diabetes. She is on no medication and continues to work on diet and exercise to decrease risk of diabetes. No polyphagia.  ASSESSMENT AND PLAN:  Vitamin D deficiency - Plan: Vitamin D, Ergocalciferol, (DRISDOL) 1.25 MG (50000 UT) CAPS capsule  Insulin resistance  Class 3 severe obesity with serious comorbidity and body mass index (BMI) of 40.0 to 44.9 in adult, unspecified obesity type ([place])  PLAN:  Vitamin D Deficiency Gloria Rodriguez was informed that low Vitamin D levels contributes to fatigue  and are associated with obesity, breast, and colon cancer. She agrees to continue to take prescription Vit D @ 50,000 IU every week #4 with 0 refills and will follow-up for routine testing of Vitamin D, at least 2-3 times per year. She was informed of the risk of over-replacement of Vitamin D and agrees to not increase her dose unless she discusses this with [place] first. Gloria Rodriguez agrees to follow-up with our clinic in 2 weeks.  Insulin Resistance Gloria Rodriguez will continue to work on weight loss, exercise, and decreasing simple carbohydrates in her diet to help decrease the risk of diabetes. We dicussed metformin including benefits and risks. She was informed that eating too many simple carbohydrates or too many calories at one sitting increases the likelihood of GI side effects. Gloria Rodriguez will continue with weight loss and follow-up with [place] as directed to monitor her progress.  Obesity Gloria Rodriguez is currently in the action stage of change. As such, her goal is to continue with weight loss efforts. She has agreed to keep a food journal with 1500-1800 calories and 95 grams of protein daily. Gloria Rodriguez has been instructed to work up to a goal of 150 minutes of combined cardio and strengthening exercise per week for weight loss and overall health benefits. We discussed the following Behavioral Modification Strategies today: increasing lean protein intake and no skipping meals.  Gloria Rodriguez has agreed to follow-up with our clinic in 2 weeks. She was informed of the importance of frequent follow-up visits to maximize her success with intensive lifestyle modifications for her multiple health conditions.  ALLERGIES: Allergies  Allergen Reactions  . Other Itching and Other (See Comments)    Any pain med that is in a tablet form. Causes Itching, vomiting,migraine- is the sodium binding in the tablet form   . Codeine Other (See Comments)    migraines  . Hydrocodone Other (See Comments)    migraines  . Oxycodone Other (See Comments)     migraines  . Ultram [Tramadol Hcl] Other (See Comments)    Migraines/nightmares/ STATES ANY SODIUM BINDING TABLET CAUSES ALLERGIC REACTION  . Vicodin [Hydrocodone-Acetaminophen] Other (See Comments)    migraines    MEDICATIONS: Current Outpatient Medications on File Prior to Visit  Medication Sig Dispense Refill  . ALPRAZolam (XANAX) 0.25 MG tablet Take 0.25 mg by mouth as needed for anxiety.    Marland Kitchen amphetamine-dextroamphetamine (ADDERALL XR) 30 MG 24 hr capsule Take 30 mg by mouth daily with breakfast.     . amphetamine-dextroamphetamine (ADDERALL) 10 MG tablet Take 10 mg by mouth as needed.    . AZO-CRANBERRY PO Take by mouth as needed.    . cetirizine (ZYRTEC) 10 MG tablet Take 10 mg by mouth 2 (two) times daily.     . clomiPRAMINE (ANAFRANIL) 75 MG capsule Take 150 mg by mouth at bedtime.  3  . colestipol (COLESTID) 1 g tablet TAKE 2 TABLETS (2 G TOTAL) BY MOUTH 2 (TWO) TIMES DAILY. 180 tablet 1  . hyoscyamine (LEVSIN SL) 0.125 MG SL tablet DISSOLVE 1 TABLET ON THE TONGUE EVERY 6 HOURS AS NEEDED FOR CRAMPING, DIARRHEA. 60 tablet 1  . lamoTRIgine (LAMICTAL) 150 MG tablet Take 150 mg by mouth daily.    Marland Kitchen levonorgestrel (MIRENA) 20 MCG/24HR IUD 1 each by Intrauterine route once.    . methocarbamol (ROBAXIN) 500 MG tablet TAKE 1 TABLET TWICE A DAY AS NEEDED, LIMIT 1-2 TREATMENTS PER WEEK  0  . nitrofurantoin, macrocrystal-monohydrate, (MACROBID) 100 MG capsule Take 100 mg by mouth as needed.    . ondansetron (ZOFRAN) 4 MG tablet Take 1 tablet (4 mg total) by mouth every 6 (six) hours as needed for nausea or vomiting. 30 tablet 1  . OVER THE COUNTER MEDICATION Trigger point injections in the scalp of  decadron , Lidocaine, marcaine -for chronic migraines as needed  Gets very 2 months    . pantoprazole (PROTONIX) 40 MG tablet TAKE 30- 60 MIN BEFORE YOUR FIRST AND LAST MEALS OF THE DAY 180 tablet 0  . pramipexole (MIRAPEX) 1 MG tablet Take 1 mg by mouth at bedtime.    . promethazine (PHENERGAN)  25 MG tablet Take 25 mg by mouth every 6 (six) hours as needed for nausea or vomiting (for migraine headaches).     . rifaximin (XIFAXAN) 550 MG TABS tablet Take 1 tablet (550 mg total) by mouth 3 (three) times daily. 42 tablet 0  . sertraline (ZOLOFT) 100 MG tablet Take 100 mg by mouth every morning.     . valACYclovir (VALTREX) 1000 MG tablet Take 1,000 mg by mouth as needed.    . Vitamin D, Ergocalciferol, (DRISDOL) 1.25 MG (50000 UT) CAPS capsule Take 1 capsule (50,000 Units total) by mouth every 7 (seven) days. 4 capsule 0  . zonisamide (ZONEGRAN) 100 MG capsule Take 2 capsules by mouth daily.   1   No current facility-administered medications on file prior to visit.     PAST MEDICAL HISTORY: Past Medical History:  Diagnosis Date  . ADD (attention deficit disorder)   . Allergy    allergy meds daily   . Anxiety  hx bipolar, ADHD  . Arthritis    left shoulder   . Bruxism   . Chronic kidney disease    kindney stones, interstitial cystitis history  . Chronic migraine   . Depression    states well controlled  . Diarrhea   . GERD (gastroesophageal reflux disease)    hx gallstones and pancreatitis per pt  . Headache(784.0)   . HSV infection    on Valtrex suppression  . Hypotension    normal rens 80/60  . Kidney problem   . Lactose intolerance   . No pertinent past medical history    states with gall bladder attacks with chest pain, vomiting and diarrhes    EKG  in [facility]  . OCD (obsessive compulsive disorder)   . Periodic limb movement   . Postpartum care and examination of lactating mother 07/13/2011  . Sleep apnea    NO SLEEP APNEA BUT STATES HAS PERIODIC LIMB MOVEMENT DISORDER_ WAS IN MIRAPEX PRE PREGNANCY  . Sleep disorder 03/03/2014  . SOB (shortness of breath)   . Swelling     PAST SURGICAL HISTORY: Past Surgical History:  Procedure Laterality Date  . CESAREAN SECTION    . CESAREAN SECTION  07/11/2011   Procedure: CESAREAN SECTION;  Surgeon: Princess Bruins;   Location: [facility];  Service: Gynecology;  Laterality: N/A;  Repeat  . CHOLECYSTECTOMY  11/14/2011   Procedure: LAPAROSCOPIC CHOLECYSTECTOMY WITH INTRAOPERATIVE CHOLANGIOGRAM;  Surgeon: Joyice Faster. Cornett, MD;  Location: [facility];  Service: General;  Laterality: N/A;  Laparoscopic Cholecystectomy with cholangiogram, Rodriguez-arm  . COLONOSCOPY    . LASIK     2007  . TEE WITHOUT CARDIOVERSION N/A 11/18/2013   Procedure: TRANSESOPHAGEAL ECHOCARDIOGRAM (TEE);  Surgeon: Laverda Page, MD;  Location: [facility];  Service: Cardiovascular;  Laterality: N/A;  . WISDOM TOOTH EXTRACTION      SOCIAL HISTORY: Social History   Tobacco Use  . Smoking status: Never Smoker  . Smokeless tobacco: Never Used  . Tobacco comment: smoked a few cigs  in college at a bar but never smoked for a year straight   Substance Use Topics  . Alcohol use: Not Currently    Alcohol/week: 1.0 standard drinks    Types: 1 Cans of beer per week    Comment: socially -none in 1 1/2 years   . Drug use: Not Currently    Types: Other-see comments, Marijuana    Comment: in college    FAMILY HISTORY: Family History  Problem Relation Age of Onset  . Hypertension Mother   . Hyperlipidemia Mother   . Thyroid disease Mother   . Depression Mother   . Anxiety disorder Mother   . Obesity Mother   . Heart disease Maternal Grandmother   . Prostate cancer Maternal Grandfather   . Stomach cancer Neg Hx   . Rectal cancer Neg Hx   . Colon cancer Neg Hx   . Colon polyps Neg Hx   . Esophageal cancer Neg Hx    ROS: Review of Systems  Gastrointestinal: Negative for nausea and vomiting.  Musculoskeletal:       Negative for muscle weakness.  Endo/Heme/Allergies:       Negative for polyphagia.   PHYSICAL EXAM: Pt in no acute distress  RECENT LABS AND TESTS: BMET    Component Value Date/Time   NA 138 01/01/2019 1315   K 4.2 01/01/2019 1315   CL 99 01/01/2019 1315   CO2 23 01/01/2019 1315   GLUCOSE 83 01/01/2019 1315  GLUCOSE  79 03/25/2015 1217   BUN 10 01/01/2019 1315   CREATININE 0.86 01/01/2019 1315   CREATININE 0.73 03/25/2015 1217   CALCIUM 9.3 01/01/2019 1315   GFRNONAA 83 01/01/2019 1315   GFRAA 96 01/01/2019 1315   Lab Results  Component Value Date   HGBA1C 5.5 01/01/2019   Lab Results  Component Value Date   INSULIN 7.2 01/01/2019   CBC    Component Value Date/Time   WBC 8.9 01/01/2019 1315   WBC 8.1 03/25/2015 1225   WBC 12.9 (H) 11/15/2011 0432   RBC 4.92 01/01/2019 1315   RBC 4.54 03/25/2015 1225   RBC 4.21 11/15/2011 0432   HGB 12.9 01/01/2019 1315   HCT 40.6 01/01/2019 1315   PLT 222 11/15/2011 0432   MCV 83 01/01/2019 1315   MCH 26.2 (L) 01/01/2019 1315   MCH 26.9 (A) 03/25/2015 1225   MCH 28.0 11/15/2011 0432   MCHC 31.8 01/01/2019 1315   MCHC 31.9 03/25/2015 1225   MCHC 32.4 11/15/2011 0432   RDW 14.8 01/01/2019 1315   LYMPHSABS 2.3 01/01/2019 1315   MONOABS 0.1 11/14/2011 1731   EOSABS 0.0 01/01/2019 1315   BASOSABS 0.1 01/01/2019 1315   Iron/TIBC/Ferritin/ %Sat No results found for: IRON, TIBC, FERRITIN, IRONPCTSAT Lipid Panel     Component Value Date/Time   CHOL 162 01/01/2019 1315   TRIG 129 01/01/2019 1315   HDL 66 01/01/2019 1315   LDLCALC 70 01/01/2019 1315   Hepatic Function Panel     Component Value Date/Time   PROT 7.2 01/01/2019 1315   ALBUMIN 4.4 01/01/2019 1315   AST 25 01/01/2019 1315   ALT 25 01/01/2019 1315   ALKPHOS 103 01/01/2019 1315   BILITOT 0.3 01/01/2019 1315      Component Value Date/Time   TSH 3.440 01/01/2019 1315   TSH 2.623 12/28/2014 1156   TSH 1.16 04/27/2014 1701   Results for Allis, Gloria Rodriguez (MRN [id number]) as of 05/15/2019 13:26  Ref. Range 01/01/2019 13:15  Vitamin D, 25-Hydroxy Latest Ref Range: 30.0 - 100.0 ng/mL 13.6 (L)    I, Michaelene Song, am acting as Location manager for [employer], PA-Rodriguez I, Abby Potash, PA-Rodriguez have reviewed above note and agree with its content

## 2019-05-20 ENCOUNTER — Other Ambulatory Visit (INDEPENDENT_AMBULATORY_CARE_PROVIDER_SITE_OTHER): Payer: Self-pay | Admitting: Physician Assistant

## 2019-05-20 DIAGNOSIS — E559 Vitamin D deficiency, unspecified: Secondary | ICD-10-CM

## 2019-05-22 ENCOUNTER — Other Ambulatory Visit (INDEPENDENT_AMBULATORY_CARE_PROVIDER_SITE_OTHER): Payer: Self-pay | Admitting: Physician Assistant

## 2019-05-22 DIAGNOSIS — J301 Allergic rhinitis due to pollen: Secondary | ICD-10-CM | POA: Diagnosis not present

## 2019-05-22 DIAGNOSIS — J3089 Other allergic rhinitis: Secondary | ICD-10-CM | POA: Diagnosis not present

## 2019-05-22 DIAGNOSIS — J3081 Allergic rhinitis due to animal (cat) (dog) hair and dander: Secondary | ICD-10-CM | POA: Diagnosis not present

## 2019-05-22 DIAGNOSIS — T781XXD Other adverse food reactions, not elsewhere classified, subsequent encounter: Secondary | ICD-10-CM | POA: Diagnosis not present

## 2019-05-22 DIAGNOSIS — E559 Vitamin D deficiency, unspecified: Secondary | ICD-10-CM

## 2019-05-27 ENCOUNTER — Other Ambulatory Visit: Payer: Self-pay | Admitting: Internal Medicine

## 2019-05-28 ENCOUNTER — Encounter (INDEPENDENT_AMBULATORY_CARE_PROVIDER_SITE_OTHER): Payer: Self-pay | Admitting: Physician Assistant

## 2019-05-28 ENCOUNTER — Ambulatory Visit (INDEPENDENT_AMBULATORY_CARE_PROVIDER_SITE_OTHER): Payer: Medicare HMO | Admitting: Physician Assistant

## 2019-05-28 DIAGNOSIS — J3089 Other allergic rhinitis: Secondary | ICD-10-CM | POA: Diagnosis not present

## 2019-05-28 DIAGNOSIS — J3081 Allergic rhinitis due to animal (cat) (dog) hair and dander: Secondary | ICD-10-CM | POA: Diagnosis not present

## 2019-05-28 DIAGNOSIS — J301 Allergic rhinitis due to pollen: Secondary | ICD-10-CM | POA: Diagnosis not present

## 2019-06-02 ENCOUNTER — Other Ambulatory Visit: Payer: Self-pay | Admitting: Gastroenterology

## 2019-06-03 ENCOUNTER — Other Ambulatory Visit: Payer: Self-pay | Admitting: *Deleted

## 2019-06-09 ENCOUNTER — Other Ambulatory Visit (INDEPENDENT_AMBULATORY_CARE_PROVIDER_SITE_OTHER): Payer: Self-pay | Admitting: Physician Assistant

## 2019-06-09 DIAGNOSIS — E559 Vitamin D deficiency, unspecified: Secondary | ICD-10-CM

## 2019-06-12 ENCOUNTER — Encounter: Payer: Self-pay | Admitting: *Deleted

## 2019-06-12 ENCOUNTER — Other Ambulatory Visit: Payer: Self-pay | Admitting: *Deleted

## 2019-06-12 ENCOUNTER — Telehealth: Payer: Self-pay | Admitting: Gastroenterology

## 2019-06-12 DIAGNOSIS — R197 Diarrhea, unspecified: Secondary | ICD-10-CM

## 2019-06-12 MED ORDER — SULFAMETHOXAZOLE-TRIMETHOPRIM 800-160 MG PO TABS: 1.0000 | ORAL_TABLET | Freq: Two times a day (BID) | ORAL | 0 refills | Status: AC

## 2019-06-12 NOTE — Telephone Encounter (Signed)
Please advise on note below [name]:

## 2019-06-12 NOTE — Telephone Encounter (Signed)
I know that she is getting tired of the diarrhea. Given the persistent symptoms, I would like to proceed with some additional testing: fecal elastase, fasting serum gastrin, calcitonin, somatostatin, VIP, and 24 hours urine 5-HIAA.   Can repeat a course of Bactrim x 14 days after these labs are obtained.  Please schedule a follow-up appointment with me 2 weeks after the labs are drawn (turn around time will be longer than normal as they are send out tests).  Thank you.

## 2019-06-12 NOTE — Telephone Encounter (Signed)
Patient called in wanting to speak with the nurse. She stated that she is still having the same issues of chronic diarrhea,not eating,shaking and wanting a call for advice

## 2019-06-12 NOTE — Telephone Encounter (Signed)
Had a lengthy conversation with the patient who reports the gradual return of her unpleasant symptoms with the last 10 days being the worst. The patient reports that she has continued to strictly adhered to the [place] since her last OV in April. The patient reports that she is having 6/10 episodes of watery diarrhea, daily and abd cramps. She states that after the course of Bactrim antibx, there was a significant decrease in her cramping and diarrhea, enough of a decrease that she stopped taking the Hyoscyamine for about the first 3 weeks in June. She reported that the Colestipol would work for a while but then the dose would have to incrementally be increased to achieve therapeutic effect. The patient went to Dr. Tiajuana Amass for allergy testing but reports she was told that she received no real answers concerning allergies. Seasonal allergies could cause drainage that could cause GI upset. She does not believe this is the source of her GI problems.

## 2019-06-12 NOTE — Telephone Encounter (Signed)
Called patient, left detailed message, told the patient to call back if needed and also sent a detailed my chart message.  Orders in Epic:   5 HIAA, quantitative, urine 24 hour  Calcitonin  Gastrin (fasting)  Fecal elastase  Somatostatin  VIP  Previous Bactrim prescription reordered.  Patient told to call the day that she comes in for labs so she can be scheduled 2-3 weeks out with [name] for an encounter.

## 2019-06-13 ENCOUNTER — Other Ambulatory Visit: Payer: Self-pay | Admitting: Internal Medicine

## 2019-06-24 ENCOUNTER — Other Ambulatory Visit: Payer: Medicare HMO

## 2019-06-24 DIAGNOSIS — R197 Diarrhea, unspecified: Secondary | ICD-10-CM | POA: Diagnosis not present

## 2019-06-26 DIAGNOSIS — R197 Diarrhea, unspecified: Secondary | ICD-10-CM | POA: Diagnosis not present

## 2019-06-30 ENCOUNTER — Encounter: Payer: Self-pay | Admitting: *Deleted

## 2019-07-02 LAB — 5 HIAA, QUANTITATIVE, URINE, 24 HOUR
5 HIAA, 24 Hour Urine: 3.1 mg/24 h (ref <=6.0)
Total Volume: 1850 mL

## 2019-07-09 DIAGNOSIS — R197 Diarrhea, unspecified: Secondary | ICD-10-CM | POA: Diagnosis not present

## 2019-07-12 LAB — GASTRIN: Gastrin: 223 pg/mL — ABNORMAL HIGH (ref <=100)

## 2019-07-12 LAB — VASOACTIVE INTESTINAL PEPTIDE (VIP): VASOACTIVE INTESTINAL POLYPEPTIDE (VIP) , P: <50 pg/mL (ref <75)

## 2019-07-12 LAB — SOMATOSTATIN: SOMATOSTATIN: 22 pg/mL

## 2019-07-12 LAB — CALCITONIN: Calcitonin: <2 pg/mL (ref <=5)

## 2019-07-14 ENCOUNTER — Ambulatory Visit (INDEPENDENT_AMBULATORY_CARE_PROVIDER_SITE_OTHER): Payer: Medicare HMO | Admitting: Gastroenterology

## 2019-07-14 DIAGNOSIS — R197 Diarrhea, unspecified: Secondary | ICD-10-CM

## 2019-07-14 MED ORDER — HYOSCYAMINE SULFATE 0.125 MG SL SUBL: 0.1250 mg | SUBLINGUAL_TABLET | Freq: Four times a day (QID) | SUBLINGUAL | 1 refills | Status: DC | PRN

## 2019-07-14 MED ORDER — SULFAMETHOXAZOLE-TRIMETHOPRIM 800-160 MG PO TABS: 1.0000 | ORAL_TABLET | Freq: Two times a day (BID) | ORAL | 0 refills | Status: AC

## 2019-07-14 NOTE — Patient Instructions (Addendum)
Please increase the hyoscyamine to four times daily.  Continue the low FODMAP diet.   Please take 14 days of Septra DS (also known as Bactrim).  Continue to take the pantoprazole 40 mg daily.   You may use Imodium as needed.     Let's plan to follow-up in a couple of months. Please call with any questions or concerns before then. Follow up scheduled for 08/25/2019 at 8:30 am.

## 2019-07-14 NOTE — Progress Notes (Signed)
TELEHEALTH VISIT  Referring Provider: Leonard Downing, MD Primary Care Physician:  Leonard Downing, MD   Tele-visit due to COVID-19 pandemic Patient requested visit virtually, consented to the virtual encounter via [place] (converted to audio because of the loss of video/internet connectivity) WebEx technology was not sustainable due to technical difficulties and the remainder of the appointment was converted to a telephone visit Contact made at: 10:05am 03/13/19 Patient verified by name and date of birth Location of patient: Home Location provider: My medical office Names of persons participating: Me, patient, husband Aaron Edelman), [place] Time spent on telehealth visit: 35 minutes  Chief complaint:  Ongoing diarrhea    IMPRESSION:  Diarrhea aggravated by fatty meals - ? Bile acid diarrhea +/- diarrhea-predominant IBS    - developed after cholecystectomy    - gets constipated with almost all antidiarrheal agents    - insurance refused Questran    - Cholestipol resulted in constipation followed by worsened diarrhea    - normal TSH 01/01/19 3.440    - colonoscopy 02/20/19: normal random biospies GERD, adequately controlled on pantroprazole    - previously on Nexium 40 mg daily x 7 years    - EGD 02/20/19: mild vascular congestion, squamous ballooning and surface parakeratosis        - biopsies negative for eosinophilic esophagitis Gastrin on 223 on pantoprazole Lactose intolerance Cholecystectomy for cholelithiasis 2012 Obesity with BMI of 43 Internal hemorrhoids on colononoscopy for bleeding prior to 2007 No polyps on colonoscopy 02/20/19    - screening colonoscopy due 2030 No family history of colon cancer or polyps  Still having diarrhea. Recent labs show normal 5-HIAA, calcitonin, somatostatin, VIP. Pancreatic elastase pending.  Elevated gastrin is appropriately elevated in response to pantoprazole.  Trial of antibiotics for bacterial overgrowth. Suspected  diarrhea-IBS +/- bile acid diarrhea. Xifaxan not approved by her insurance. Will use 2 weeks of Septra DS BID.   GERD is adequately controlled at this time.   PLAN: Increase the hyoscyamine QID Continue the low FODMAP diet Septra DS BID x 14 days (refill today) Imodium as needed Continue pantoprazole 40 mg daily If no response to Bactrim or it is cost prohibitive: stool for fecal calprotectin Follow-up in 4-6 weeks or earlier as needed (40 minute appointment) Consider food allergy testing Screening colonoscopy due 2030    HPI: Gloria Rodriguez is a 43 y.o. female with ongoing diarrhea. The interval history is obtained through the patient, her husband, and review of her electronic health record.   She had endoscopic evaluation 02/20/19. Colonoscopy was normal except for internal hemorrhoids. Random colon biopsies were normal. EGD showed LA Class A reflux esophagitis. Biopsies showed mild vascular congestion, squamous ballooning and surface parakeratosis. There was no evidence for eosinophilic esophagitis.  Additional evaluation has included a negative celiac panel and giardia negative.  Her daughter had a procedure. Some concerns for Covid after the procedure.  Quarantined and could not complete her stool studies earlier. Returned them but results are not yet available.  Unable to get the Xifaxan due to cost. She did not start Bactrim because she was waiting for the stool study to be performed. .   Feeling awful. Going the bathroom 8-10 times daily. Can't tolerate yogurt. Eating rice, gluten free bread, goat cheese.  Drinking black tea with lactose free half-and-half. Diarrhea worsens as she eats more. Weight is stable.   Using hyoscyamine three times daily and that has helped with the cramping.  Husband remains concerned about her  ongoing symptoms.   Feels like symptoms were better while using antibiotics. Wonders if she should be on antibiotics long term.   Recent labs show normal  gastrin, 5-HIAA, calcitonin, somatostatin, VIP. Pancreatic elastase pending.   I reviewed Epic to screen for other causes of diarrhea beyond bile acid diarrhea. TSH was normal 01/01/19. O&P screen negative 2916, No electrolyte abnormalities noted.   Past Medical History:  Diagnosis Date  . ADD (attention deficit disorder)   . Allergy    allergy meds daily   . Anxiety    hx bipolar, ADHD  . Arthritis    left shoulder   . Bruxism   . Chronic kidney disease    kindney stones, interstitial cystitis history  . Chronic migraine   . Depression    states well controlled  . Diarrhea   . GERD (gastroesophageal reflux disease)    hx gallstones and pancreatitis per pt  . Headache(784.0)   . HSV infection    on Valtrex suppression  . Hypotension    normal rens 80/60  . Kidney problem   . Lactose intolerance   . No pertinent past medical history    states with gall bladder attacks with chest pain, vomiting and diarrhes    EKG  in EPIC  . OCD (obsessive compulsive disorder)   . Periodic limb movement   . Postpartum care and examination of lactating mother 07/13/2011  . Sleep apnea    NO SLEEP APNEA BUT STATES HAS PERIODIC LIMB MOVEMENT DISORDER_ WAS IN MIRAPEX PRE PREGNANCY  . Sleep disorder 03/03/2014  . SOB (shortness of breath)   . Swelling     Past Surgical History:  Procedure Laterality Date  . CESAREAN SECTION    . CESAREAN SECTION  07/11/2011   Procedure: CESAREAN SECTION;  Surgeon: Princess Bruins;  Location: [facility];  Service: Gynecology;  Laterality: N/A;  Repeat  . CHOLECYSTECTOMY  11/14/2011   Procedure: LAPAROSCOPIC CHOLECYSTECTOMY WITH INTRAOPERATIVE CHOLANGIOGRAM;  Surgeon: Joyice Faster. Cornett, MD;  Location: [facility];  Service: General;  Laterality: N/A;  Laparoscopic Cholecystectomy with cholangiogram, c-arm  . COLONOSCOPY    . LASIK     2007  . TEE WITHOUT CARDIOVERSION N/A 11/18/2013   Procedure: TRANSESOPHAGEAL ECHOCARDIOGRAM (TEE);  Surgeon: Laverda Page, MD;   Location: [facility];  Service: Cardiovascular;  Laterality: N/A;  . WISDOM TOOTH EXTRACTION      Current Outpatient Medications  Medication Sig Dispense Refill  . ALPRAZolam (XANAX) 0.25 MG tablet Take 0.25 mg by mouth as needed for anxiety.    Marland Kitchen amphetamine-dextroamphetamine (ADDERALL XR) 30 MG 24 hr capsule Take 30 mg by mouth daily with breakfast.     . amphetamine-dextroamphetamine (ADDERALL) 10 MG tablet Take 10 mg by mouth as needed.    . AZO-CRANBERRY PO Take by mouth as needed.    . cetirizine (ZYRTEC) 10 MG tablet Take 10 mg by mouth 2 (two) times daily.     . clomiPRAMINE (ANAFRANIL) 75 MG capsule Take 150 mg by mouth at bedtime.  3  . colestipol (COLESTID) 1 g tablet TAKE 2 TABLETS (2 G TOTAL) BY MOUTH 2 (TWO) TIMES DAILY. 180 tablet 1  . hyoscyamine (LEVSIN SL) 0.125 MG SL tablet DISSOLVE 1 TABLET ON THE TONGUE EVERY 6 HOURS AS NEEDED FOR CRAMPING, DIARRHEA. 120 tablet 1  . lamoTRIgine (LAMICTAL) 150 MG tablet Take 150 mg by mouth daily.    Marland Kitchen levonorgestrel (MIRENA) 20 MCG/24HR IUD 1 each by Intrauterine route once.    . methocarbamol (  ROBAXIN) 500 MG tablet TAKE 1 TABLET TWICE A DAY AS NEEDED, LIMIT 1-2 TREATMENTS PER WEEK  0  . nitrofurantoin, macrocrystal-monohydrate, (MACROBID) 100 MG capsule Take 100 mg by mouth as needed.    . ondansetron (ZOFRAN) 4 MG tablet Take 1 tablet (4 mg total) by mouth every 6 (six) hours as needed for nausea or vomiting. 30 tablet 1  . OVER THE COUNTER MEDICATION Trigger point injections in the scalp of  decadron , Lidocaine, marcaine -for chronic migraines as needed  Gets very 2 months    . pantoprazole (PROTONIX) 40 MG tablet TAKE 30- 60 MIN BEFORE YOUR FIRST AND LAST MEALS OF THE DAY 180 tablet 0  . pramipexole (MIRAPEX) 1 MG tablet Take 1 mg by mouth at bedtime.    . promethazine (PHENERGAN) 25 MG tablet Take 25 mg by mouth every 6 (six) hours as needed for nausea or vomiting (for migraine headaches).     . rifaximin (XIFAXAN) 550 MG TABS  tablet Take 1 tablet (550 mg total) by mouth 3 (three) times daily. 42 tablet 0  . sertraline (ZOLOFT) 100 MG tablet Take 100 mg by mouth every morning.     . valACYclovir (VALTREX) 1000 MG tablet Take 1,000 mg by mouth as needed.    . Vitamin D, Ergocalciferol, (DRISDOL) 1.25 MG (50000 UT) CAPS capsule Take 1 capsule (50,000 Units total) by mouth every 7 (seven) days. 4 capsule 0  . zonisamide (ZONEGRAN) 100 MG capsule Take 2 capsules by mouth daily.   1   No current facility-administered medications for this visit.     Allergies as of 07/14/2019 - Review Complete 05/15/2019  Allergen Reaction Noted  . Other Itching and Other (See Comments) 11/01/2011  . Codeine Other (See Comments) 07/11/2011  . Hydrocodone Other (See Comments) 07/11/2011  . Oxycodone Other (See Comments) 07/11/2011  . Ultram [tramadol hcl] Other (See Comments) 07/11/2011  . Vicodin [hydrocodone-acetaminophen] Other (See Comments) 07/11/2011    Family History  Problem Relation Age of Onset  . Hypertension Mother   . Hyperlipidemia Mother   . Thyroid disease Mother   . Depression Mother   . Anxiety disorder Mother   . Obesity Mother   . Heart disease Maternal Grandmother   . Prostate cancer Maternal Grandfather   . Stomach cancer Neg Hx   . Rectal cancer Neg Hx   . Colon cancer Neg Hx   . Colon polyps Neg Hx   . Esophageal cancer Neg Hx     Social History   Socioeconomic History  . Marital status: Married    Spouse name: Michol Emory  . Number of children: 2  . Years of education: Not on file  . Highest education level: Not on file  Occupational History  . Occupation: Disabled  Social Needs  . Financial resource strain: Not on file  . Food insecurity    Worry: Not on file    Inability: Not on file  . Transportation needs    Medical: Not on file    Non-medical: Not on file  Tobacco Use  . Smoking status: Never Smoker  . Smokeless tobacco: Never Used  . Tobacco comment: smoked a few cigs  in  college at a bar but never smoked for a year straight   Substance and Sexual Activity  . Alcohol use: Not Currently    Alcohol/week: 1.0 standard drinks    Types: 1 Cans of beer per week    Comment: socially -none in 1 1/2 years   .  Drug use: Not Currently    Types: Other-see comments, Marijuana    Comment: in college  . Sexual activity: Yes    Birth control/protection: I.U.D.  Lifestyle  . Physical activity    Days per week: Not on file    Minutes per session: Not on file  . Stress: Not on file  Relationships  . Social Herbalist on phone: Not on file    Gets together: Not on file    Attends religious service: Not on file    Active member of club or organization: Not on file    Attends meetings of clubs or organizations: Not on file    Relationship status: Not on file  . Intimate partner violence    Fear of current or ex partner: Not on file    Emotionally abused: Not on file    Physically abused: Not on file    Forced sexual activity: Not on file  Other Topics Concern  . Not on file  Social History Narrative  . Not on file    Review of Systems: ALL ROS discussed and all others negative except listed in HPI.  Physical Exam: General: in no acute distress Neuro: Alert and appropriate Psych: Normal affect and normal insight Exam limited due to [employer] L. Tarri Glenn, MD, MPH [facility] 07/14/2019, 9:53 AM

## 2019-07-15 ENCOUNTER — Encounter: Payer: Self-pay | Admitting: Gastroenterology

## 2019-07-16 LAB — PANCREATIC ELASTASE, FECAL: Pancreatic Elastase-1, Stool: >500 mcg/g

## 2019-07-17 ENCOUNTER — Encounter: Payer: Self-pay | Admitting: *Deleted

## 2019-07-28 ENCOUNTER — Encounter (INDEPENDENT_AMBULATORY_CARE_PROVIDER_SITE_OTHER): Payer: Self-pay | Admitting: Family Medicine

## 2019-07-28 DIAGNOSIS — F900 Attention-deficit hyperactivity disorder, predominantly inattentive type: Secondary | ICD-10-CM | POA: Diagnosis not present

## 2019-07-28 DIAGNOSIS — F429 Obsessive-compulsive disorder, unspecified: Secondary | ICD-10-CM | POA: Diagnosis not present

## 2019-07-28 DIAGNOSIS — R69 Illness, unspecified: Secondary | ICD-10-CM | POA: Diagnosis not present

## 2019-07-28 NOTE — Telephone Encounter (Signed)
Please advise 

## 2019-07-31 ENCOUNTER — Encounter: Payer: Self-pay | Admitting: *Deleted

## 2019-07-31 ENCOUNTER — Other Ambulatory Visit: Payer: Self-pay | Admitting: *Deleted

## 2019-07-31 MED ORDER — AMOXICILLIN-POT CLAVULANATE 875-125 MG PO TABS: 1.0000 | ORAL_TABLET | Freq: Two times a day (BID) | ORAL | 0 refills | Status: DC

## 2019-08-04 ENCOUNTER — Encounter: Payer: Self-pay | Admitting: *Deleted

## 2019-08-04 ENCOUNTER — Other Ambulatory Visit: Payer: Self-pay | Admitting: *Deleted

## 2019-08-04 MED ORDER — DOXYCYCLINE HYCLATE 100 MG PO CAPS: 100.0000 mg | ORAL_CAPSULE | Freq: Two times a day (BID) | ORAL | 0 refills | Status: AC

## 2019-08-04 NOTE — Progress Notes (Signed)
Entered in error

## 2019-08-04 NOTE — Telephone Encounter (Signed)
Please advise 

## 2019-08-04 NOTE — Telephone Encounter (Signed)
The patient is taking Amoxicillin-Clavulanate (Augmentin) 875-125 BID x14 days.

## 2019-08-05 ENCOUNTER — Other Ambulatory Visit: Payer: Self-pay | Admitting: Gastroenterology

## 2019-08-13 NOTE — Progress Notes (Addendum)
TELEHEALTH VISIT  Referring Provider: Leonard Downing, MD Primary Care Physician:  Leonard Downing, MD   Tele-visit due to COVID-19 pandemic Patient requested visit virtually, consented to the virtual encounter via [place] made at: 8:35 08/25/19 Patient verified by name and date of birth Location of patient: Home Location provider: My medical office Names of persons participating: Me, patient  Time spent on telehealth visit: 37 minutes  Chief complaint:  Ongoing diarrhea    IMPRESSION:  Diarrhea aggravated by specific foods and fatty meals - ? Bile acid diarrhea +/- diarrhea-predominant IBS    - developed after cholecystectomy    - gets constipated with almost all antidiarrheal agents    - insurance refused Questran    - Cholestipol resulted in constipation followed by worsened diarrhea    - normal TSH 01/01/19 3.440    - colonoscopy 02/20/19: normal random biospies    - normal 5-HIAA, calcitonin, somatostatin, VIP, and pancreatic elastase    - insurance denied coverage of Xifaxan    - empiric treatment for SIBO recommended, improved on Septra DS, but returned with cessation of treatment    - did not tolerate Augmentin    - trial of doxycycline provided some relief but not enough and not sustained       - may have been more related to dietary restrictions that Mrs. Nicoletti implemented at that time    - food allergy testing was negative GERD, adequately controlled on pantroprazole    - previously on Nexium 40 mg daily x 7 years    - EGD 02/20/19: mild vascular congestion, squamous ballooning and surface parakeratosis        - biopsies negative for eosinophilic esophagitis    - started pantoprazole after her EGD Gastrin of 223 on pantoprazole Lactose intolerance Cholecystectomy for cholelithiasis 2012 Obesity with BMI of 43 Internal hemorrhoids on colononoscopy for bleeding prior to 2007 No polyps on colonoscopy 02/20/19    - screening colonoscopy due 2030  No family history of colon cancer or polyps   Suspected diarrhea-IBS +/- bile acid diarrhea +/- food intolerance but there may be more going on. Symptoms are triggered by food. Formal food allergy testing was negative. Antibiotics provided some relief, but not sustained and it's unclear if the benefit was due mostly to additional food restrictions that she made with her diet.   GERD is adequately controlled at this time.  Will switch pantoprazole to famotidine in the event this is contributing to her diarrhea.   PLAN: Continue hyoscyamine QID PRN Imodium as needed Discontinue pantoprazole - taper recommended Trial of famotidine 20 mg BID Resume Nexium 40 mg daily if she has breakthrough symptoms on famotidine Hydrogen breath test for fructose intolerance Screening colonoscopy due 2030 Follow-up in 4-6 weeks or earlier as needed (40 minute appointment)   HPI: Bristol C Grist is a 43 y.o. female with ongoing diarrhea. The interval history is obtained through the patient, her husband, and review of her electronic health record.   She had endoscopic evaluation 02/20/19. Colonoscopy was normal except for internal hemorrhoids. Random colon biopsies were normal. EGD showed LA Class A reflux esophagitis. Biopsies showed mild vascular congestion, squamous ballooning and surface parakeratosis. There was no evidence for eosinophilic esophagitis.  Additional evaluation has included a negative celiac panel and giardia negative.  Symptoms improved on Septra DS BID, but, returned immediately after discontinuation. Trial of Augmentin 875 mg BID was associated with significant side effects. Treated with doxycycline 100 mg BID x 10  days. Symptoms have leveled off since she complete the doxycycline.  Currently doing "marginally" better. Having 2-3 poorly formed and often watery bowel movements.  Previously this was as much as 8-10 times daily. Cramping has improved and she has not needed the hyoscyamine as much.  Continues to have issues with specific foods. Only tolerating lactose free, high protein greek yogurt with jam, a small amount of granola, and a few Cheerios.  She tolerated gluten free chocolate cake and a lactose free ice cream, but she could only eat this in moderation. Will has postprandial urgency and diarrhea with goat cheese, egg whites, gluten free pizza, and salmon - all of which she was previously able to tolerate. Drinking black tea with lactose free half-and-half. Carbonated water results in significant bloating.  She wonders if the Protonix might be related. She also asks about an alternative to colestipol.   Appetite is good. Initially lost weight. Has regained some. Weight has recently been stable, but, she attributes this to inactivity and working hard to eat enough. She is concerned that her diet has too much sugar and this is not helping her lose weight.   No new complaints or concerns.   Past Medical History:  Diagnosis Date  . ADD (attention deficit disorder)   . Allergy    allergy meds daily   . Anxiety    hx bipolar, ADHD  . Arthritis    left shoulder   . Bruxism   . Chronic kidney disease    kindney stones, interstitial cystitis history  . Chronic migraine   . Depression    states well controlled  . Diarrhea   . GERD (gastroesophageal reflux disease)    hx gallstones and pancreatitis per pt  . Headache(784.0)   . HSV infection    on Valtrex suppression  . Hypotension    normal rens 80/60  . Kidney problem   . Lactose intolerance   . No pertinent past medical history    states with gall bladder attacks with chest pain, vomiting and diarrhes    EKG  in [facility]  . OCD (obsessive compulsive disorder)   . Periodic limb movement   . Postpartum care and examination of lactating mother 07/13/2011  . Sleep apnea    NO SLEEP APNEA BUT STATES HAS PERIODIC LIMB MOVEMENT DISORDER_ WAS IN MIRAPEX PRE PREGNANCY  . Sleep disorder 03/03/2014  . SOB (shortness of breath)   .  Swelling     Past Surgical History:  Procedure Laterality Date  . CESAREAN SECTION    . CESAREAN SECTION  07/11/2011   Procedure: CESAREAN SECTION;  Surgeon: Princess Bruins;  Location: [facility];  Service: Gynecology;  Laterality: N/A;  Repeat  . CHOLECYSTECTOMY  11/14/2011   Procedure: LAPAROSCOPIC CHOLECYSTECTOMY WITH INTRAOPERATIVE CHOLANGIOGRAM;  Surgeon: Joyice Faster. Cornett, MD;  Location: [facility];  Service: General;  Laterality: N/A;  Laparoscopic Cholecystectomy with cholangiogram, c-arm  . COLONOSCOPY    . LASIK     2007  . TEE WITHOUT CARDIOVERSION N/A 11/18/2013   Procedure: TRANSESOPHAGEAL ECHOCARDIOGRAM (TEE);  Surgeon: Laverda Page, MD;  Location: [facility];  Service: Cardiovascular;  Laterality: N/A;  . WISDOM TOOTH EXTRACTION      Current Outpatient Medications  Medication Sig Dispense Refill  . ALPRAZolam (XANAX) 0.25 MG tablet Take 0.25 mg by mouth as needed for anxiety.    Marland Kitchen amphetamine-dextroamphetamine (ADDERALL XR) 30 MG 24 hr capsule Take 30 mg by mouth daily with breakfast.     . amphetamine-dextroamphetamine (ADDERALL)  10 MG tablet Take 10 mg by mouth as needed.    . AZO-CRANBERRY PO Take by mouth as needed.    . cetirizine (ZYRTEC) 10 MG tablet Take 10 mg by mouth 2 (two) times daily.     . clomiPRAMINE (ANAFRANIL) 75 MG capsule Take 150 mg by mouth at bedtime.  3  . colestipol (COLESTID) 1 g tablet TAKE 2 TABLETS (2 G TOTAL) BY MOUTH 2 (TWO) TIMES DAILY. 180 tablet 1  . doxycycline (VIBRAMYCIN) 100 MG capsule Take 1 capsule (100 mg total) by mouth 2 (two) times daily for 10 days. 20 capsule 0  . hyoscyamine (LEVSIN SL) 0.125 MG SL tablet DISSOLVE 1 TABLET ON THE TONGUE EVERY 6 HOURS AS NEEDED FOR CRAMPING, DIARRHEA. 120 tablet 1  . lamoTRIgine (LAMICTAL) 150 MG tablet Take 150 mg by mouth daily.    Marland Kitchen levonorgestrel (MIRENA) 20 MCG/24HR IUD 1 each by Intrauterine route once.    . methocarbamol (ROBAXIN) 500 MG tablet TAKE 1 TABLET TWICE A DAY AS NEEDED, LIMIT  1-2 TREATMENTS PER WEEK  0  . nitrofurantoin, macrocrystal-monohydrate, (MACROBID) 100 MG capsule Take 100 mg by mouth as needed.    . ondansetron (ZOFRAN) 4 MG tablet Take 1 tablet (4 mg total) by mouth every 6 (six) hours as needed for nausea or vomiting. 30 tablet 1  . OVER THE COUNTER MEDICATION Trigger point injections in the scalp of  decadron , Lidocaine, marcaine -for chronic migraines as needed  Gets very 2 months    . pantoprazole (PROTONIX) 40 MG tablet TAKE 30- 60 MIN BEFORE YOUR FIRST AND LAST MEALS OF THE DAY 180 tablet 0  . pramipexole (MIRAPEX) 1 MG tablet Take 1 mg by mouth at bedtime.    . promethazine (PHENERGAN) 25 MG tablet Take 25 mg by mouth every 6 (six) hours as needed for nausea or vomiting (for migraine headaches).     . rifaximin (XIFAXAN) 550 MG TABS tablet Take 1 tablet (550 mg total) by mouth 3 (three) times daily. 42 tablet 0  . sertraline (ZOLOFT) 100 MG tablet Take 100 mg by mouth every morning.     . valACYclovir (VALTREX) 1000 MG tablet Take 1,000 mg by mouth as needed.    . Vitamin D, Ergocalciferol, (DRISDOL) 1.25 MG (50000 UT) CAPS capsule Take 1 capsule (50,000 Units total) by mouth every 7 (seven) days. 4 capsule 0  . zonisamide (ZONEGRAN) 100 MG capsule Take 2 capsules by mouth daily.   1   No current facility-administered medications for this visit.     Allergies as of 08/25/2019 - Review Complete 07/15/2019  Allergen Reaction Noted  . Other Itching and Other (See Comments) 11/01/2011  . Codeine Other (See Comments) 07/11/2011  . Hydrocodone Other (See Comments) 07/11/2011  . Oxycodone Other (See Comments) 07/11/2011  . Ultram [tramadol hcl] Other (See Comments) 07/11/2011  . Vicodin [hydrocodone-acetaminophen] Other (See Comments) 07/11/2011    Family History  Problem Relation Age of Onset  . Hypertension Mother   . Hyperlipidemia Mother   . Thyroid disease Mother   . Depression Mother   . Anxiety disorder Mother   . Obesity Mother   .  Heart disease Maternal Grandmother   . Prostate cancer Maternal Grandfather   . Stomach cancer Neg Hx   . Rectal cancer Neg Hx   . Colon cancer Neg Hx   . Colon polyps Neg Hx   . Esophageal cancer Neg Hx     Social History   Socioeconomic History  .  Marital status: Married    Spouse name: Xareni Mirabito  . Number of children: 2  . Years of education: Not on file  . Highest education level: Not on file  Occupational History  . Occupation: Disabled  Social Needs  . Financial resource strain: Not on file  . Food insecurity    Worry: Not on file    Inability: Not on file  . Transportation needs    Medical: Not on file    Non-medical: Not on file  Tobacco Use  . Smoking status: Never Smoker  . Smokeless tobacco: Never Used  . Tobacco comment: smoked a few cigs  in college at a bar but never smoked for a year straight   Substance and Sexual Activity  . Alcohol use: Not Currently    Alcohol/week: 1.0 standard drinks    Types: 1 Cans of beer per week    Comment: socially -none in 1 1/2 years   . Drug use: Not Currently    Types: Other-see comments, Marijuana    Comment: in college  . Sexual activity: Yes    Birth control/protection: I.U.D.  Lifestyle  . Physical activity    Days per week: Not on file    Minutes per session: Not on file  . Stress: Not on file  Relationships  . Social Herbalist on phone: Not on file    Gets together: Not on file    Attends religious service: Not on file    Active member of club or organization: Not on file    Attends meetings of clubs or organizations: Not on file    Relationship status: Not on file  . Intimate partner violence    Fear of current or ex partner: Not on file    Emotionally abused: Not on file    Physically abused: Not on file    Forced sexual activity: Not on file  Other Topics Concern  . Not on file  Social History Narrative  . Not on file    Complete physical exam not performed due to the limits inherent  in a telehealth encounter.  Gen: Awake, alert, and oriented, and well communicative. HEENT: EOMI, non-icteric sclera, NCAT, MMM  Neck: Normal movement of head and neck  Pulm: No labored breathing, speaking in full sentences without conversational dyspnea  Derm: No apparent lesions or bruising in visible field  MS: Moves all visible extremities without noticeable abnormality  Psych: Pleasant, cooperative, normal speech, thought processing seemingly intact     Jp Eastham L. Tarri Glenn, MD, MPH [facility] 08/13/2019, 11:21 AM

## 2019-08-21 ENCOUNTER — Encounter: Payer: Self-pay | Admitting: *Deleted

## 2019-08-21 ENCOUNTER — Telehealth: Payer: Self-pay | Admitting: *Deleted

## 2019-08-21 NOTE — Telephone Encounter (Signed)
Per [name] patient message reply, Fructose Breath Test lab requisition faxed to [facility]. Confirmation receipt received and placed with fax.

## 2019-08-25 ENCOUNTER — Ambulatory Visit (INDEPENDENT_AMBULATORY_CARE_PROVIDER_SITE_OTHER): Payer: Medicare HMO | Admitting: Gastroenterology

## 2019-08-25 ENCOUNTER — Encounter: Payer: Self-pay | Admitting: Gastroenterology

## 2019-08-25 DIAGNOSIS — M791 Myalgia, unspecified site: Secondary | ICD-10-CM | POA: Diagnosis not present

## 2019-08-25 DIAGNOSIS — R197 Diarrhea, unspecified: Secondary | ICD-10-CM

## 2019-08-25 DIAGNOSIS — M542 Cervicalgia: Secondary | ICD-10-CM | POA: Diagnosis not present

## 2019-08-25 DIAGNOSIS — G518 Other disorders of facial nerve: Secondary | ICD-10-CM | POA: Diagnosis not present

## 2019-08-25 DIAGNOSIS — G43719 Chronic migraine without aura, intractable, without status migrainosus: Secondary | ICD-10-CM | POA: Diagnosis not present

## 2019-08-25 MED ORDER — FAMOTIDINE 20 MG PO TABS: 20.0000 mg | ORAL_TABLET | Freq: Two times a day (BID) | ORAL | 3 refills | Status: DC

## 2019-08-25 NOTE — Patient Instructions (Addendum)
Continue hyoscyamine four times a day as needed for abdominal cramping.   Continue low FODMAP diet.   Use Imodium as needed.   Taper your pantoprazole in the event this is contributing to your loose stools. Reduce your dose to once daily for one week, then reduce to every other day for one week.  Start famotidine 20 mg twice daily. I would start this now and not wait until you are off the pantoprazole.   If you are having any breakthrough symptoms on famotidine, we will see if your insurance company will cover Nexium 40 mg daily.   Hydrogen breath test for fructose intolerance recommended. I recommend that you call your insurance to find out about cost.   Let's plan to see each other again in 4-6 weeks, or earlier if needed. Ideally, this will occur after your breath test results are back.

## 2019-09-01 DIAGNOSIS — Z20828 Contact with and (suspected) exposure to other viral communicable diseases: Secondary | ICD-10-CM | POA: Diagnosis not present

## 2019-09-01 DIAGNOSIS — R509 Fever, unspecified: Secondary | ICD-10-CM | POA: Diagnosis not present

## 2019-09-11 ENCOUNTER — Other Ambulatory Visit: Payer: Self-pay | Admitting: Gastroenterology

## 2019-10-02 ENCOUNTER — Telehealth: Payer: Self-pay | Admitting: *Deleted

## 2019-10-02 NOTE — Telephone Encounter (Signed)
Please be aware: Per patient message, patient requested and allowed virtual visit on 10/26 at 8:30 am (40 min visit per [place]).

## 2019-10-02 NOTE — Telephone Encounter (Signed)
Thank you :)

## 2019-10-06 ENCOUNTER — Ambulatory Visit (INDEPENDENT_AMBULATORY_CARE_PROVIDER_SITE_OTHER): Payer: Medicare HMO | Admitting: Gastroenterology

## 2019-10-06 ENCOUNTER — Other Ambulatory Visit: Payer: Self-pay

## 2019-10-06 ENCOUNTER — Encounter: Payer: Self-pay | Admitting: Gastroenterology

## 2019-10-06 DIAGNOSIS — R197 Diarrhea, unspecified: Secondary | ICD-10-CM

## 2019-10-06 DIAGNOSIS — K219 Gastro-esophageal reflux disease without esophagitis: Secondary | ICD-10-CM

## 2019-10-06 NOTE — Progress Notes (Signed)
TELEHEALTH VISIT  Referring Provider: Leonard Downing, MD Primary Care Physician:  Leonard Downing, MD   Tele-visit due to COVID-19 pandemic Patient requested visit virtually, consented to the virtual encounter via [place] made at: 10/06/19 08:36 Patient verified by name and date of birth Location of patient: Home Location provider: My medical office Names of persons participating: Me, patient  Time spent on telehealth visit: 36 minutes  Chief complaint:  Ongoing diarrhea and reflux    IMPRESSION:  Diarrhea aggravated by specific foods and fatty meals     - ? Bile acid diarrhea +/- diarrhea-predominant IBS    - developed after cholecystectomy    - gets constipated with almost all antidiarrheal agents    - insurance refused Questran    - Cholestipol resulted in constipation followed by worsened diarrhea    - normal TSH 01/01/19 3.440    - colonoscopy 02/20/19: normal random biospies    - normal 5-HIAA, calcitonin, somatostatin, VIP, and pancreatic elastase    - insurance denied coverage of Xifaxan    - improved with Septra as empiric treatment for SIBO, returned with cessation of treatment    - did not tolerate Augmentin    - trial of doxycycline provided some relief but not enough and not sustained       - may have been more related to self-induced dietary restrictions at that time    - food allergy testing was negative GERD, adequately controlled on pantroprazole    - previously on Nexium 40 mg daily x 7 years    - EGD 02/20/19: mild vascular congestion, squamous ballooning, surface parakeratosis        - biopsies negative for eosinophilic esophagitis    - started pantoprazole after her EGD Gastrin of 223 on pantoprazole Lactose intolerance Cholecystectomy for cholelithiasis 2012 Obesity with BMI of 43 Internal hemorrhoids on colononoscopy for bleeding prior to 2007 No polyps on colonoscopy 02/20/19    - screening colonoscopy due 2030 No family history  of colon cancer or polyps   Suspected diarrhea-IBS +/- bile acid diarrhea +/- dissaccharidase deficiency. Symptoms are triggered by food. Formal food allergy testing was negative. Antibiotics provided some relief, but not sustained and it's unclear if the benefit was due mostly to additional food restrictions that she made with her diet.  Slightly improved after discontinuation of pantoprazole. Unfortunately,  GERD is inadequately controlled after the switch to Nexium and H2 blocker. She has been unable to complete her hydrogen breath test because she cannot hold GERD medications for 24 hours with severe symptoms.    PLAN: Continue hyoscyamine QID PRN Continue colestipol 2g BID Imodium as needed Continue famotidine 20 mg BID Continue Nexium 40 mg daily if she has breakthrough symptoms on famotidine Esophageal impedence and 24 hour pH probe to evaluate refractory reflux Hydrogen breath test for fructose/sucrose intolerance after further addressing reflux Screening colonoscopy due 2030 I've encourged her to work on stress reduction practices: yoga, meditation, diaphragmatic breathing Follow-up 2 weeks after the pH probe (30 minute appointment)   HPI: Gloria Rodriguez is a 43 y.o. female with ongoing diarrhea. The interval history is obtained through the patient and review of her electronic health record.  She is disabled due to daily chronic migraines.   She had endoscopic evaluation 02/20/19. Colonoscopy was normal except for internal hemorrhoids. Random colon biopsies were normal. EGD showed LA Class A reflux esophagitis. Biopsies showed mild vascular congestion, squamous ballooning and surface parakeratosis. There was no evidence for eosinophilic  esophagitis.  Additional evaluation has included a negative celiac panel and giardia negative.  Symptoms improved on Septra DS BID, but, returned immediately after discontinuation. Trial of Augmentin 875 mg BID was associated with significant side  effects. Treated with doxycycline 100 mg BID x 10 days. Symptoms have leveled off since she complete the doxycycline.   She thinks the medication change is working. She feels like the antibiotics helped. Malodorous stools have improved after antibiotics. Bowel frequency has slightly decreased. Having 2-3 poorly formed and often watery bowel movements.  Previously this was as much as 8-10 times daily. She is no longer feeling doubled over from explosive gas.  Can only eat yogurt. Not tolerating any other foods including goat cheese, apple chips, pop tart with chick pea fiber. Even eggs and a small piece of [place] bacon exacerbated her pain. Associated discomfort and bloating after eating. Diarrhea occurs the next day.  Continues to have some regurgitation despite 100% adherence with GERD medications.  Intermittent appetite.  Weight fluctuating but overall stable if not slightly increased. She is very stationary due to her symptoms.  Will take IBGard with water. Continues on a lactose free diet. If she tightly controls her diet she can improve management of her symptoms.    She is not currently working with the [facility] because she could not enact their recommendations.  Abby Potash, PA with [facility].   She has been unable to complete the breath test because she can't stop the PPI for 24 hours.   Support: parents and other family and friends who are her usual support are all sequestered due to coronavirus  Past Medical History:  Diagnosis Date  . ADD (attention deficit disorder)   . Allergy    allergy meds daily   . Anxiety    hx bipolar, ADHD  . Arthritis    left shoulder   . Bruxism   . Chronic kidney disease    kindney stones, interstitial cystitis history  . Chronic migraine   . Depression    states well controlled  . Diarrhea   . GERD (gastroesophageal reflux disease)    hx gallstones and pancreatitis per pt  . Headache(784.0)   .  HSV infection    on Valtrex suppression  . Hypotension    normal rens 80/60  . Kidney problem   . Lactose intolerance   . No pertinent past medical history    states with gall bladder attacks with chest pain, vomiting and diarrhes    EKG  in EPIC  . OCD (obsessive compulsive disorder)   . Periodic limb movement   . Postpartum care and examination of lactating mother 07/13/2011  . Sleep apnea    NO SLEEP APNEA BUT STATES HAS PERIODIC LIMB MOVEMENT DISORDER_ WAS IN MIRAPEX PRE PREGNANCY  . Sleep disorder 03/03/2014  . SOB (shortness of breath)   . Swelling     Past Surgical History:  Procedure Laterality Date  . CESAREAN SECTION    . CESAREAN SECTION  07/11/2011   Procedure: CESAREAN SECTION;  Surgeon: Princess Bruins;  Location: [facility];  Service: Gynecology;  Laterality: N/A;  Repeat  . CHOLECYSTECTOMY  11/14/2011   Procedure: LAPAROSCOPIC CHOLECYSTECTOMY WITH INTRAOPERATIVE CHOLANGIOGRAM;  Surgeon: Joyice Faster. Cornett, MD;  Location: [facility];  Service: General;  Laterality: N/A;  Laparoscopic Cholecystectomy with cholangiogram, c-arm  . COLONOSCOPY    . LASIK     2007  . TEE WITHOUT CARDIOVERSION N/A 11/18/2013   Procedure:  TRANSESOPHAGEAL ECHOCARDIOGRAM (TEE);  Surgeon: Laverda Page, MD;  Location: [facility];  Service: Cardiovascular;  Laterality: N/A;  . WISDOM TOOTH EXTRACTION      Current Outpatient Medications  Medication Sig Dispense Refill  . ALPRAZolam (XANAX) 0.25 MG tablet Take 0.25 mg by mouth as needed for anxiety.    Marland Kitchen amphetamine-dextroamphetamine (ADDERALL XR) 30 MG 24 hr capsule Take 30 mg by mouth daily with breakfast.     . amphetamine-dextroamphetamine (ADDERALL) 10 MG tablet Take 10 mg by mouth as needed.    . AZO-CRANBERRY PO Take by mouth as needed.    . cetirizine (ZYRTEC) 10 MG tablet Take 10 mg by mouth 2 (two) times daily.     . clomiPRAMINE (ANAFRANIL) 75 MG capsule Take 150 mg by mouth at bedtime.  3  . colestipol (COLESTID) 1 g tablet TAKE 2  TABLETS (2 G TOTAL) BY MOUTH 2 (TWO) TIMES DAILY. 180 tablet 1  . famotidine (MM FAMOTIDINE) 20 MG tablet Take 1 tablet (20 mg total) by mouth 2 (two) times daily. 60 tablet 3  . hyoscyamine (LEVSIN SL) 0.125 MG SL tablet DISSOLVE 1 TABLET ON THE TONGUE EVERY 6 HOURS AS NEEDED FOR CRAMPING, DIARRHEA. 120 tablet 1  . lamoTRIgine (LAMICTAL) 150 MG tablet Take 150 mg by mouth daily.    Marland Kitchen levonorgestrel (MIRENA) 20 MCG/24HR IUD 1 each by Intrauterine route once.    . methocarbamol (ROBAXIN) 500 MG tablet TAKE 1 TABLET TWICE A DAY AS NEEDED, LIMIT 1-2 TREATMENTS PER WEEK  0  . nitrofurantoin, macrocrystal-monohydrate, (MACROBID) 100 MG capsule Take 100 mg by mouth as needed.    . ondansetron (ZOFRAN) 4 MG tablet Take 1 tablet (4 mg total) by mouth every 6 (six) hours as needed for nausea or vomiting. 30 tablet 1  . OVER THE COUNTER MEDICATION Trigger point injections in the scalp of  decadron , Lidocaine, marcaine -for chronic migraines as needed  Gets very 2 months    . pantoprazole (PROTONIX) 40 MG tablet TAKE 30- 60 MIN BEFORE YOUR FIRST AND LAST MEALS OF THE DAY 180 tablet 0  . pramipexole (MIRAPEX) 1 MG tablet Take 1 mg by mouth at bedtime.    . promethazine (PHENERGAN) 25 MG tablet Take 25 mg by mouth every 6 (six) hours as needed for nausea or vomiting (for migraine headaches).     . rifaximin (XIFAXAN) 550 MG TABS tablet Take 1 tablet (550 mg total) by mouth 3 (three) times daily. 42 tablet 0  . sertraline (ZOLOFT) 100 MG tablet Take 100 mg by mouth every morning.     . valACYclovir (VALTREX) 1000 MG tablet Take 1,000 mg by mouth as needed.    . Vitamin D, Ergocalciferol, (DRISDOL) 1.25 MG (50000 UT) CAPS capsule Take 1 capsule (50,000 Units total) by mouth every 7 (seven) days. 4 capsule 0  . zonisamide (ZONEGRAN) 100 MG capsule Take 2 capsules by mouth daily.   1   No current facility-administered medications for this visit.     Allergies as of 10/06/2019 - Review Complete 08/25/2019   Allergen Reaction Noted  . Other Itching and Other (See Comments) 11/01/2011  . Codeine Other (See Comments) 07/11/2011  . Hydrocodone Other (See Comments) 07/11/2011  . Oxycodone Other (See Comments) 07/11/2011  . Ultram [tramadol hcl] Other (See Comments) 07/11/2011  . Vicodin [hydrocodone-acetaminophen] Other (See Comments) 07/11/2011    Family History  Problem Relation Age of Onset  . Hypertension Mother   . Hyperlipidemia Mother   . Thyroid  disease Mother   . Depression Mother   . Anxiety disorder Mother   . Obesity Mother   . Heart disease Maternal Grandmother   . Prostate cancer Maternal Grandfather   . Stomach cancer Neg Hx   . Rectal cancer Neg Hx   . Colon cancer Neg Hx   . Colon polyps Neg Hx   . Esophageal cancer Neg Hx     Social History   Socioeconomic History  . Marital status: Married    Spouse name: Karee Warnick  . Number of children: 2  . Years of education: Not on file  . Highest education level: Not on file  Occupational History  . Occupation: Disabled  Social Needs  . Financial resource strain: Not on file  . Food insecurity    Worry: Not on file    Inability: Not on file  . Transportation needs    Medical: Not on file    Non-medical: Not on file  Tobacco Use  . Smoking status: Never Smoker  . Smokeless tobacco: Never Used  . Tobacco comment: smoked a few cigs  in college at a bar but never smoked for a year straight   Substance and Sexual Activity  . Alcohol use: Not Currently    Alcohol/week: 1.0 standard drinks    Types: 1 Cans of beer per week    Comment: socially -none in 1 1/2 years   . Drug use: Not Currently    Types: Other-see comments, Marijuana    Comment: in college  . Sexual activity: Yes    Birth control/protection: I.U.D.  Lifestyle  . Physical activity    Days per week: Not on file    Minutes per session: Not on file  . Stress: Not on file  Relationships  . Social Herbalist on phone: Not on file    Gets  together: Not on file    Attends religious service: Not on file    Active member of club or organization: Not on file    Attends meetings of clubs or organizations: Not on file    Relationship status: Not on file  . Intimate partner violence    Fear of current or ex partner: Not on file    Emotionally abused: Not on file    Physically abused: Not on file    Forced sexual activity: Not on file  Other Topics Concern  . Not on file  Social History Narrative  . Not on file    Complete physical exam not performed due to the limits inherent in a telehealth encounter.  Gen: Awake, alert, and oriented, and well communicative. HEENT: EOMI, non-icteric sclera, NCAT, MMM  Neck: Normal movement of head and neck  Pulm: No labored breathing, speaking in full sentences without conversational dyspnea  Derm: No apparent lesions or bruising in visible field  MS: Moves all visible extremities without noticeable abnormality  Psych: Pleasant, cooperative, normal speech, thought processing seemingly intact     Velvet Moomaw L. Tarri Glenn, MD, MPH [facility] 10/06/2019, 8:29 AM

## 2019-10-06 NOTE — Patient Instructions (Addendum)
I have not changed your medications today.  I recommended esophageal impedance and 24-hour pH probe studies to further evaluate why you are having reflux despite treatment.  We will further address the abdominal discomfort and diarrhea when the results of the reflux study is available as I am hoping this will allow [place] to tailor your treatment.  Because GI symptoms are often made worse by stress, taking steps to manage stress in your life may help relieve symptoms. Some people find that relaxation techniques including breathing exercises, meditation, and yoga help reduce stress. As well, physical activity and adequate sleep can help control stress, as can any activity that you find calming - reading, listening to music, taking a walk with a friend, for example. Counseling and support from counselors, friends, family, and support groups can also help manage stress and help you find ways to avoid or navigate stressful situations.  We will plan to follow-up 2 weeks after the esophageal impedance and 24-hour pH probe studies.  Please call or MyChart with any questions in the meantime.

## 2019-10-08 NOTE — Addendum Note (Signed)
Addended by: Wyline Beady on: 10/08/2019 04:24 PM   Modules accepted: Orders, SmartSet

## 2019-10-11 ENCOUNTER — Other Ambulatory Visit (HOSPITAL_COMMUNITY)
Admission: RE | Admit: 2019-10-11 | Discharge: 2019-10-11 | Disposition: A | Discharge status: Home / Self Care | Payer: Medicare HMO | Source: Ambulatory Visit | Attending: Gastroenterology | Admitting: Gastroenterology

## 2019-10-11 ENCOUNTER — Other Ambulatory Visit (HOSPITAL_COMMUNITY): Payer: Medicare HMO

## 2019-10-11 DIAGNOSIS — Z20828 Contact with and (suspected) exposure to other viral communicable diseases: Secondary | ICD-10-CM | POA: Insufficient documentation

## 2019-10-11 DIAGNOSIS — Z01812 Encounter for preprocedural laboratory examination: Secondary | ICD-10-CM | POA: Diagnosis not present

## 2019-10-12 LAB — NOVEL CORONAVIRUS, NAA (HOSP ORDER, SEND-OUT TO REF LAB; TAT 18-24 HRS): SARS-CoV-2, NAA: NOT DETECTED

## 2019-10-15 ENCOUNTER — Encounter (HOSPITAL_COMMUNITY): Payer: Self-pay | Admitting: *Deleted

## 2019-10-15 ENCOUNTER — Ambulatory Visit (HOSPITAL_COMMUNITY)
Admission: RE | Admit: 2019-10-15 | Discharge: 2019-10-15 | Disposition: A | Discharge status: Home / Self Care | Payer: Medicare HMO | Attending: Gastroenterology | Admitting: Gastroenterology

## 2019-10-15 ENCOUNTER — Encounter (HOSPITAL_COMMUNITY)
Admission: RE | Disposition: A | Discharge status: Home / Self Care | Payer: Self-pay | Source: Home / Self Care | Attending: Gastroenterology

## 2019-10-15 DIAGNOSIS — R1110 Vomiting, unspecified: Secondary | ICD-10-CM | POA: Diagnosis not present

## 2019-10-15 DIAGNOSIS — R12 Heartburn: Secondary | ICD-10-CM

## 2019-10-15 DIAGNOSIS — K219 Gastro-esophageal reflux disease without esophagitis: Secondary | ICD-10-CM | POA: Diagnosis present

## 2019-10-15 HISTORY — PX: 24 HOUR PH STUDY: SHX5419

## 2019-10-15 HISTORY — PX: ESOPHAGEAL MANOMETRY: SHX5429

## 2019-10-15 SURGERY — MANOMETRY, ESOPHAGUS

## 2019-10-15 MED ORDER — LIDOCAINE VISCOUS HCL 2 % MT SOLN
OROMUCOSAL | Status: AC
  Filled 2019-10-15: qty 15

## 2019-10-15 SURGICAL SUPPLY — 2 items
FACESHIELD LNG OPTICON STERILE (SAFETY) IMPLANT
GLOVE BIO SURGEON STRL SZ8 (GLOVE) ×6 IMPLANT

## 2019-10-15 NOTE — Progress Notes (Addendum)
Esophageal manometry performed per protocol.  Patient tolerated well.  PH probe placed at 35 cm from left nare.  Patient verbalized understanding of directions and equipment.  Patient will return tomorrow, 10/15/2019 at 1345 to have probe removed.  Report sent to Dr Harl Bowie.

## 2019-10-17 ENCOUNTER — Encounter (HOSPITAL_COMMUNITY): Payer: Self-pay | Admitting: Gastroenterology

## 2019-10-19 ENCOUNTER — Other Ambulatory Visit: Payer: Self-pay | Admitting: Gastroenterology

## 2019-10-20 ENCOUNTER — Other Ambulatory Visit: Payer: Self-pay | Admitting: *Deleted

## 2019-10-20 MED ORDER — FAMOTIDINE 40 MG PO TABS: 40.0000 mg | ORAL_TABLET | Freq: Two times a day (BID) | ORAL | 2 refills | Status: DC

## 2019-10-21 DIAGNOSIS — R12 Heartburn: Secondary | ICD-10-CM

## 2019-10-21 DIAGNOSIS — K219 Gastro-esophageal reflux disease without esophagitis: Secondary | ICD-10-CM

## 2019-10-21 DIAGNOSIS — R1110 Vomiting, unspecified: Secondary | ICD-10-CM

## 2019-10-23 ENCOUNTER — Encounter: Payer: Self-pay | Admitting: *Deleted

## 2019-11-26 ENCOUNTER — Ambulatory Visit (INDEPENDENT_AMBULATORY_CARE_PROVIDER_SITE_OTHER): Payer: Medicare HMO | Admitting: Gastroenterology

## 2019-11-26 DIAGNOSIS — E739 Lactose intolerance, unspecified: Secondary | ICD-10-CM | POA: Diagnosis not present

## 2019-11-26 DIAGNOSIS — K219 Gastro-esophageal reflux disease without esophagitis: Secondary | ICD-10-CM

## 2019-11-26 DIAGNOSIS — M542 Cervicalgia: Secondary | ICD-10-CM | POA: Diagnosis not present

## 2019-11-26 DIAGNOSIS — M791 Myalgia, unspecified site: Secondary | ICD-10-CM | POA: Diagnosis not present

## 2019-11-26 DIAGNOSIS — R197 Diarrhea, unspecified: Secondary | ICD-10-CM

## 2019-11-26 DIAGNOSIS — G518 Other disorders of facial nerve: Secondary | ICD-10-CM | POA: Diagnosis not present

## 2019-11-26 DIAGNOSIS — G43719 Chronic migraine without aura, intractable, without status migrainosus: Secondary | ICD-10-CM | POA: Diagnosis not present

## 2019-11-26 MED ORDER — ESOMEPRAZOLE MAGNESIUM 40 MG PO CPDR: 40.0000 mg | DELAYED_RELEASE_CAPSULE | Freq: Two times a day (BID) | ORAL | 3 refills | Status: DC

## 2019-11-26 NOTE — Patient Instructions (Addendum)
Continue hyoscyamine four times a day as needed  Continue colestipol 2g twice a day Imodium as needed Start Nexium 40 mg twice a day  x 12 weeks Continue famotidine 20 mg twice a day  Elevated the Head of the bed Avoid night time eating Stop eating 3-4 hours prior to bedtime  Consider referral to nutritionist at [place] colonoscopy due 2030 Working on stress reduction practices: yoga, meditation, diaphragmatic breathing may improve some of your symptoms  Follow-up in 4 weeks

## 2019-11-26 NOTE — Progress Notes (Signed)
TELEHEALTH VISIT  Referring Provider: Leonard Downing, MD Primary Care Physician:  Leonard Downing, MD   Tele-visit due to COVID-19 pandemic Patient requested visit virtually, consented to the virtual encounter via [place] made at: 10/06/19 08:36 Patient verified by name and date of birth Location of patient: Home Location provider: My medical office Names of persons participating: Me, patient  Time spent on telehealth visit: 36 minutes  Chief complaint:  Ongoing diarrhea and reflux    IMPRESSION:  GERD, with predominant symptoms when supine    - previously on Nexium 40 mg daily x 7 years    - EGD 02/20/19: mild vascular congestion, squamous ballooning, surface parakeratosis        - biopsies negative for eosinophilic esophagitis    - started pantoprazole after her EGD    - Esophageal manometry:  normal relaxation of the EG junction.  Fragmented peristalsis.         - 24-hour pH probe: slightly increased esophageal acid exposure with a DeMeester score of 21.1.       -   The acid exposure is abnormally predominant in the supine position. Diarrhea aggravated by specific foods and fatty meals     - ? Bile acid diarrhea +/- diarrhea-predominant IBS    - developed after cholecystectomy    - gets constipated with almost all antidiarrheal agents    - insurance refused Questran    - Cholestipol resulted in constipation followed by worsened diarrhea    - normal TSH 01/01/19 3.440    - colonoscopy 02/20/19: normal random biospies    - normal 5-HIAA, calcitonin, somatostatin, VIP, and pancreatic elastase    - insurance denied coverage of Xifaxan    - improved with Septra as empiric treatment for SIBO, returned with cessation of treatment    - did not tolerate Augmentin    - trial of doxycycline provided some relief but not enough and not sustained       - may have been more related to self-induced dietary restrictions at that time    - food allergy testing was  negative Gastrin of 223 on pantoprazole Lactose intolerance Cholecystectomy for cholelithiasis 2012 Obesity with BMI of 43 Internal hemorrhoids on colononoscopy for bleeding prior to 2007 No polyps on colonoscopy 02/20/19    - screening colonoscopy due 2030 No family history of colon cancer or polyps   Although she has suspected diarrhea-IBS +/- bile acid diarrhea +/- dissaccharidase deficiency +/- SIBO, her recent pH probe suggests that reflux may be a large component of her symptoms.   Antibiotics provided some relief, but not sustained and it's unclear if the benefit was due mostly to additional food restrictions that she made with her diet. I am concerned that she has now developed some food fear.   GERD is inadequately controlled after the switch to Nexium and H2 blocker.   PLAN: Continue hyoscyamine QID PRN Continue colestipol 2g BID Imodium as neededS Start omeprazole 40 mg BID x 12 weeks Continue famotidine 20 mg BID Elevated the Head of the bed Avoid night time eating Stop eating 3-4 hours prior to bedtime Consider referral to nutritionist at [facility] colonoscopy due 2030 I've encourged her to work on stress reduction practices: yoga, meditation, diaphragmatic breathing Follow-up in 4 weeks (40 minutes)  HPI: Gloria Rodriguez is a 43 y.o. female with ongoing diarrhea. The interval history is obtained through the patient and review of her electronic health record.  She is disabled due to  daily chronic migraines.   She had endoscopic evaluation 02/20/19. Colonoscopy was normal except for internal hemorrhoids. Random colon biopsies were normal. EGD showed LA Class A reflux esophagitis. Biopsies showed mild vascular congestion, squamous ballooning and surface parakeratosis. There was no evidence for eosinophilic esophagitis.  Additional evaluation has included a negative celiac panel and negative stool test for giardia.  Symptoms including malodorous stools  improved  on Septra DS BID, but, returned immediately after discontinuation. Trial of Augmentin 875 mg BID was associated with significant side effects. Treated with doxycycline 100 mg BID x 10 days. Symptoms have leveled off since she complete the doxycycline. Unable to complete formal SIBO breath testing due to severe symptoms that occur when she tries to hold her PPI for 24 hours prior to the test.   Esophageal manometry showed normal relaxation of the EG junction.  Fragmented peristalsis.  Otherwise no major motor disorder.  24-hour pH probe showed slightly increased esophageal acid exposure with a DeMeester score of 21.1.  The acid exposure is abnormally predominant in the supine position.  pH study showed significant reflux predominantly in the supine positio  Continues to have difficulty with eating anything beyond yogurt. She is also able to eat eggs twice a week with an omlette with cheese. Also ate salmon, white rice, and gluten free cheese pizza.  Will eat two or three small meals. After eating this food, she finds that her stomach is a wreck.  Taking hyoscamine now to try to get things back under control. Frustrated by weight gain from eating so much surgar with the yogurt.  Now that she's off the Protonix. She is having reflux. Cough, shortness of breath, migraines have now returned.  Using Nexium 1-2 daily.  Must take IBGard 3 times daily.  Continues on a lactose free diet. Drinks soy milk to try to calm her  Relieved that she doesn't have a tumor or severe disease.   Past Medical History:  Diagnosis Date  . ADD (attention deficit disorder)   . Allergy    allergy meds daily   . Anxiety    hx bipolar, ADHD  . Arthritis    left shoulder   . Bruxism   . Chronic kidney disease    kindney stones, interstitial cystitis history  . Chronic migraine   . Depression    states well controlled  . Diarrhea   . GERD (gastroesophageal reflux disease)    hx gallstones and pancreatitis per pt  .  Headache(784.0)   . HSV infection    on Valtrex suppression  . Hypotension    normal rens 80/60  . Kidney problem   . Lactose intolerance   . No pertinent past medical history    states with gall bladder attacks with chest pain, vomiting and diarrhes    EKG  in [facility]  . OCD (obsessive compulsive disorder)   . Periodic limb movement   . Postpartum care and examination of lactating mother 07/13/2011  . Sleep apnea    NO SLEEP APNEA BUT STATES HAS PERIODIC LIMB MOVEMENT DISORDER_ WAS IN MIRAPEX PRE PREGNANCY  . Sleep disorder 03/03/2014  . SOB (shortness of breath)   . Swelling     Past Surgical History:  Procedure Laterality Date  . [place] STUDY N/A 10/15/2019   Procedure: [place] STUDY;  Surgeon: [name], MD;  Location: [facility];  Service: Gastroenterology;  Laterality: N/A;  . CESAREAN SECTION    . CESAREAN SECTION  07/11/2011   Procedure:  CESAREAN SECTION;  Surgeon: Princess Bruins;  Location: [facility];  Service: Gynecology;  Laterality: N/A;  Repeat  . CHOLECYSTECTOMY  11/14/2011   Procedure: LAPAROSCOPIC CHOLECYSTECTOMY WITH INTRAOPERATIVE CHOLANGIOGRAM;  Surgeon: Joyice Faster. Cornett, MD;  Location: [facility];  Service: General;  Laterality: N/A;  Laparoscopic Cholecystectomy with cholangiogram, c-arm  . COLONOSCOPY    . ESOPHAGEAL MANOMETRY N/A 10/15/2019   Procedure: ESOPHAGEAL MANOMETRY (EM);  Surgeon: [name], MD;  Location: [facility];  Service: Gastroenterology;  Laterality: N/A;  . LASIK     2007  . TEE WITHOUT CARDIOVERSION N/A 11/18/2013   Procedure: TRANSESOPHAGEAL ECHOCARDIOGRAM (TEE);  Surgeon: Laverda Page, MD;  Location: [facility];  Service: Cardiovascular;  Laterality: N/A;  . WISDOM TOOTH EXTRACTION      Current Outpatient Medications  Medication Sig Dispense Refill  . ALPRAZolam (XANAX) 0.25 MG tablet Take 0.25 mg by mouth as needed for anxiety.    Marland Kitchen amphetamine-dextroamphetamine (ADDERALL XR) 30 MG 24 hr capsule Take 30 mg by  mouth daily with breakfast.     . amphetamine-dextroamphetamine (ADDERALL) 10 MG tablet Take 10 mg by mouth as needed.    . AZO-CRANBERRY PO Take by mouth as needed.    . cetirizine (ZYRTEC) 10 MG tablet Take 10 mg by mouth 2 (two) times daily.     . clomiPRAMINE (ANAFRANIL) 75 MG capsule Take 150 mg by mouth at bedtime.  3  . colestipol (COLESTID) 1 g tablet TAKE 2 TABLETS (2 G TOTAL) BY MOUTH 2 (TWO) TIMES DAILY. 180 tablet 1  . famotidine (PEPCID) 40 MG tablet Take 1 tablet (40 mg total) by mouth 2 (two) times daily. 60 tablet 2  . hyoscyamine (LEVSIN SL) 0.125 MG SL tablet DISSOLVE 1 TABLET ON THE TONGUE EVERY 6 HOURS AS NEEDED FOR CRAMPING, DIARRHEA. 120 tablet 1  . lamoTRIgine (LAMICTAL) 150 MG tablet Take 150 mg by mouth daily.    Marland Kitchen levonorgestrel (MIRENA) 20 MCG/24HR IUD 1 each by Intrauterine route once.    . methocarbamol (ROBAXIN) 500 MG tablet TAKE 1 TABLET TWICE A DAY AS NEEDED, LIMIT 1-2 TREATMENTS PER WEEK  0  . nitrofurantoin, macrocrystal-monohydrate, (MACROBID) 100 MG capsule Take 100 mg by mouth as needed.    . ondansetron (ZOFRAN) 4 MG tablet Take 1 tablet (4 mg total) by mouth every 6 (six) hours as needed for nausea or vomiting. 30 tablet 1  . OVER THE COUNTER MEDICATION Trigger point injections in the scalp of  decadron , Lidocaine, marcaine -for chronic migraines as needed  Gets very 2 months    . pantoprazole (PROTONIX) 40 MG tablet TAKE 30- 60 MIN BEFORE YOUR FIRST AND LAST MEALS OF THE DAY 180 tablet 0  . pramipexole (MIRAPEX) 1 MG tablet Take 1 mg by mouth at bedtime.    . promethazine (PHENERGAN) 25 MG tablet Take 25 mg by mouth every 6 (six) hours as needed for nausea or vomiting (for migraine headaches).     . rifaximin (XIFAXAN) 550 MG TABS tablet Take 1 tablet (550 mg total) by mouth 3 (three) times daily. 42 tablet 0  . sertraline (ZOLOFT) 100 MG tablet Take 100 mg by mouth every morning.     . valACYclovir (VALTREX) 1000 MG tablet Take 1,000 mg by mouth as  needed.    . Vitamin D, Ergocalciferol, (DRISDOL) 1.25 MG (50000 UT) CAPS capsule Take 1 capsule (50,000 Units total) by mouth every 7 (seven) days. 4 capsule 0  . zonisamide (ZONEGRAN) 100 MG capsule Take 2 capsules  by mouth daily.   1   No current facility-administered medications for this visit.    Allergies as of 11/26/2019 - Review Complete 10/15/2019  Allergen Reaction Noted  . Other Itching and Other (See Comments) 11/01/2011  . Codeine Other (See Comments) 07/11/2011  . Hydrocodone Other (See Comments) 07/11/2011  . Oxycodone Other (See Comments) 07/11/2011  . Ultram [tramadol hcl] Other (See Comments) 07/11/2011  . Vicodin [hydrocodone-acetaminophen] Other (See Comments) 07/11/2011    Family History  Problem Relation Age of Onset  . Hypertension Mother   . Hyperlipidemia Mother   . Thyroid disease Mother   . Depression Mother   . Anxiety disorder Mother   . Obesity Mother   . Heart disease Maternal Grandmother   . Prostate cancer Maternal Grandfather   . Stomach cancer Neg Hx   . Rectal cancer Neg Hx   . Colon cancer Neg Hx   . Colon polyps Neg Hx   . Esophageal cancer Neg Hx     Social History   Socioeconomic History  . Marital status: Married    Spouse name: Aerionna Waithe  . Number of children: 2  . Years of education: Not on file  . Highest education level: Not on file  Occupational History  . Occupation: Disabled  Tobacco Use  . Smoking status: Never Smoker  . Smokeless tobacco: Never Used  . Tobacco comment: smoked a few cigs  in college at a bar but never smoked for a year straight   Substance and Sexual Activity  . Alcohol use: Not Currently    Alcohol/week: 1.0 standard drinks    Types: 1 Cans of beer per week    Comment: socially -none in 1 1/2 years   . Drug use: Not Currently    Types: Other-see comments, Marijuana    Comment: in college  . Sexual activity: Yes    Birth control/protection: I.U.D.  Other Topics Concern  . Not on file    Social History Narrative  . Not on file   Social Determinants of Health   Financial Resource Strain:   . Difficulty of Paying Living Expenses: Not on file  Food Insecurity:   . Worried About Charity fundraiser in the Last Year: Not on file  . Ran Out of Food in the Last Year: Not on file  Transportation Needs:   . Lack of Transportation (Medical): Not on file  . Lack of Transportation (Non-Medical): Not on file  Physical Activity:   . Days of Exercise per Week: Not on file  . Minutes of Exercise per Session: Not on file  Stress:   . Feeling of Stress : Not on file  Social Connections:   . Frequency of Communication with Friends and Family: Not on file  . Frequency of Social Gatherings with Friends and Family: Not on file  . Attends Religious Services: Not on file  . Active Member of Clubs or Organizations: Not on file  . Attends Archivist Meetings: Not on file  . Marital Status: Not on file  Intimate Partner Violence:   . Fear of Current or Ex-Partner: Not on file  . Emotionally Abused: Not on file  . Physically Abused: Not on file  . Sexually Abused: Not on file    Complete physical exam not performed due to the limits inherent in a telehealth encounter.  Gen: Awake, alert, and oriented, and well communicative. HEENT: EOMI, non-icteric sclera, NCAT, MMM  Neck: Normal movement of head and neck  Pulm:  No labored breathing, speaking in full sentences without conversational dyspnea  Derm: No apparent lesions or bruising in visible field  MS: Moves all visible extremities without noticeable abnormality  Psych: Pleasant, cooperative, normal speech, thought processing seemingly intact     Zeno Hickel L. Tarri Glenn, MD, MPH [facility] 11/26/2019, 11:20 AM

## 2019-12-04 ENCOUNTER — Telehealth: Payer: Self-pay | Admitting: Emergency Medicine

## 2019-12-04 MED ORDER — OMEPRAZOLE 40 MG PO CPDR: 40.0000 mg | DELAYED_RELEASE_CAPSULE | Freq: Every day | ORAL | 3 refills | Status: DC

## 2019-12-04 NOTE — Telephone Encounter (Signed)
May substitute with whichever PPI is better covered by her insurance. Thank you.

## 2019-12-04 NOTE — Telephone Encounter (Signed)
Pharmacy states patients nexium will be $300 out of pocket. Please advise an alternative.

## 2019-12-10 ENCOUNTER — Other Ambulatory Visit: Payer: Self-pay | Admitting: Gastroenterology

## 2019-12-14 ENCOUNTER — Other Ambulatory Visit: Payer: Self-pay | Admitting: Gastroenterology

## 2019-12-19 ENCOUNTER — Other Ambulatory Visit: Payer: Self-pay

## 2019-12-19 MED ORDER — ESOMEPRAZOLE MAGNESIUM 40 MG PO CPDR: 40.0000 mg | DELAYED_RELEASE_CAPSULE | Freq: Two times a day (BID) | ORAL | 3 refills | Status: DC

## 2019-12-30 ENCOUNTER — Ambulatory Visit (INDEPENDENT_AMBULATORY_CARE_PROVIDER_SITE_OTHER): Payer: Medicare HMO | Admitting: Gastroenterology

## 2019-12-30 ENCOUNTER — Encounter: Payer: Self-pay | Admitting: Gastroenterology

## 2019-12-30 DIAGNOSIS — R197 Diarrhea, unspecified: Secondary | ICD-10-CM

## 2019-12-30 DIAGNOSIS — K219 Gastro-esophageal reflux disease without esophagitis: Secondary | ICD-10-CM | POA: Diagnosis not present

## 2019-12-30 NOTE — Progress Notes (Signed)
TELEHEALTH VISIT  Referring Provider: Leonard Downing, MD Primary Care Physician:  Leonard Downing, MD   Tele-visit due to COVID-19 pandemic Patient requested visit virtually, consented to the virtual encounter via [place] made at: 12/31/19 15:58 Patient verified by name and date of birth Location of patient: Home Location provider: [facility] Names of persons participating: Me, patient, Tinnie Gens CMA  Time spent on telehealth visit: 28 minutes  Chief complaint:  Ongoing diarrhea and reflux    IMPRESSION:  GERD, with predominant symptoms when supine    - previously on Nexium 40 mg daily x 7 years    - EGD 02/20/19: mild vascular congestion, squamous ballooning, surface parakeratosis        - biopsies negative for eosinophilic esophagitis    - started pantoprazole after her EGD    - Esophageal manometry:  normal relaxation of the EG junction.  Fragmented peristalsis.         - 24-hour pH probe: slightly increased esophageal acid exposure with a DeMeester score of 21.1.       -   The acid exposure is abnormally predominant in the supine position    - Not tolerating multiple PPIs over the last year due to severe diarrhea Diarrhea aggravated by specific foods and fatty meals     - ? Bile acid diarrhea +/- diarrhea-predominant IBS+/- medication side effect    - developed after cholecystectomy    - gets constipated with almost all antidiarrheal agents    - insurance refused Questran    - Cholestipol resulted in constipation followed by worsened diarrhea    - normal TSH 01/01/19 3.440    - colonoscopy 02/20/19: normal random biospies    - normal 5-HIAA, calcitonin, somatostatin, VIP, and pancreatic elastase    - insurance denied coverage of Xifaxan    - improved with Septra as empiric treatment for SIBO, returned with cessation of treatment    - did not tolerate Augmentin    - trial of doxycycline provided some relief but not enough and not sustained       -  may have been more related to self-induced dietary restrictions at that time    - food allergy testing was negative Gastrin of 223 on pantoprazole Lactose intolerance Cholecystectomy for cholelithiasis 2012 Obesity with BMI of 43 Internal hemorrhoids on colononoscopy for bleeding prior to 2007 No polyps on colonoscopy 02/20/19    - screening colonoscopy due 2030 No family history of colon cancer or polyps   Although she has suspected diarrhea-IBS +/- bile acid diarrhea +/- dissaccharidase deficiency +/- SIBO, her recent pH probe suggests that reflux may be a large component of her symptoms. Unfortunately, we have been unable to control her GERD due to PPI-associated diarrhea.   Antibiotics provided some relief, but not sustained and it's unclear if the benefit was due mostly to additional food restrictions that she made with her diet. I am concerned that she has now developed significant food fears and may be developing avoidant-restrictive food intake disorder.    PLAN: - Continue hyoscyamine QID PRN - Continue colestipol 2g BID - Imodium as needed - Continue Nexium OTC as tolerated, continue famotidine 20 mg BID - Continue to elevate the head of the bed - Avoid night time eating - Stop eating 3-4 hours prior to bedtime - Referral to nutritionist at [facility] - ? If she is a candidate for the celiac clinic, the nutritionist there is excellent  - Referral to Dr. Bryan Lemma  for a virtual visit to discuss TIF as an alternative to long-term PPI therapy - Screening colonoscopy due 2030 - I've encourged her to work on stress reduction practices: yoga, meditation, diaphragmatic breathing - Follow-up in 4-6 weeks after her appointment with Dr. Bryan Lemma (40 minutes)  HPI: Gloria Rodriguez is a 44 y.o. female with ongoing diarrhea. The interval history is obtained through the patient and review of her electronic health record.  She is disabled due to daily chronic migraines.   She had  endoscopic evaluation 02/20/19. Colonoscopy was normal except for internal hemorrhoids. Random colon biopsies were normal.  EGD showed LA Class A reflux esophagitis. Biopsies showed mild vascular congestion, squamous ballooning and surface parakeratosis. There was no evidence for eosinophilic esophagitis.  Additional evaluation has included a negative celiac panel and negative stool test for giardia.  Diarrhea improved on empiric treatment for SIBO with Septra DS BID, but, returned immediately after discontinuation. Trial of Augmentin 875 mg BID was associated with significant side effects. Treated with doxycycline 100 mg BID x 10 days. Symptoms have leveled off since she complete the doxycycline. Unable to complete formal SIBO breath testing due to severe symptoms that occur when she tries to hold her PPI for 24 hours prior to the test.   Esophageal manometry showed normal relaxation of the EG junction.  Fragmented peristalsis.  Otherwise no major motor disorder.  24-hour pH probe showed slightly increased esophageal acid exposure with a DeMeester score of 21.1.  The acid exposure is abnormally predominant in the supine position.  pH study showed significant reflux predominantly in the supine position  Continues to have difficulty with eating anything beyond yogurt. Continues on a lactose free diet. Has a very restricted diet, enough that I've been concerned about food fear. Her stomach is a "wreck" after she eats.  Has severe symptoms off PPI - including reflux, cough, shortness of breath, and migraines But, finds that she has diarrhea with any PPI treatment except for Nexium  Using Nexium 1-3 daily.  Must take IBGard 3 times daily.  Nexium script was $700 month. So, she is using over the counter.  Now using OTC generic Nexium. Was using  Tabs BID. Due to soupy diarrhea with associated cramping.  Reduced the frequency to one BID one day ago for cost savings. Still hasn't fully recovered from the  lower dose.  Using Chobani and Lactaid because her usual yogurt is no longer available lactose free.   Past Medical History:  Diagnosis Date  . ADD (attention deficit disorder)   . Allergy    allergy meds daily   . Anxiety    hx bipolar, ADHD  . Arthritis    left shoulder   . Bruxism   . Chronic kidney disease    kindney stones, interstitial cystitis history  . Chronic migraine   . Depression    states well controlled  . Diarrhea   . GERD (gastroesophageal reflux disease)    hx gallstones and pancreatitis per pt  . Headache(784.0)   . HSV infection    on Valtrex suppression  . Hypotension    normal rens 80/60  . Kidney problem   . Lactose intolerance   . No pertinent past medical history    states with gall bladder attacks with chest pain, vomiting and diarrhes    EKG  in EPIC  . OCD (obsessive compulsive disorder)   . Periodic limb movement   . Postpartum care and examination of lactating mother 07/13/2011  . Sleep  apnea    NO SLEEP APNEA BUT STATES HAS PERIODIC LIMB MOVEMENT DISORDER_ WAS IN MIRAPEX PRE PREGNANCY  . Sleep disorder 03/03/2014  . SOB (shortness of breath)   . Swelling     Past Surgical History:  Procedure Laterality Date  . [place] STUDY N/A 10/15/2019   Procedure: [place] STUDY;  Surgeon: [name], MD;  Location: [facility];  Service: Gastroenterology;  Laterality: N/A;  . CESAREAN SECTION    . CESAREAN SECTION  07/11/2011   Procedure: CESAREAN SECTION;  Surgeon: Princess Bruins;  Location: [facility];  Service: Gynecology;  Laterality: N/A;  Repeat  . CHOLECYSTECTOMY  11/14/2011   Procedure: LAPAROSCOPIC CHOLECYSTECTOMY WITH INTRAOPERATIVE CHOLANGIOGRAM;  Surgeon: Joyice Faster. Cornett, MD;  Location: [facility];  Service: General;  Laterality: N/A;  Laparoscopic Cholecystectomy with cholangiogram, c-arm  . COLONOSCOPY    . ESOPHAGEAL MANOMETRY N/A 10/15/2019   Procedure: ESOPHAGEAL MANOMETRY (EM);  Surgeon: [name], MD;  Location: [facility];  Service: Gastroenterology;  Laterality: N/A;  . LASIK     2007  . TEE WITHOUT CARDIOVERSION N/A 11/18/2013   Procedure: TRANSESOPHAGEAL ECHOCARDIOGRAM (TEE);  Surgeon: Laverda Page, MD;  Location: [facility];  Service: Cardiovascular;  Laterality: N/A;  . WISDOM TOOTH EXTRACTION      Current Outpatient Medications  Medication Sig Dispense Refill  . ALPRAZolam (XANAX) 0.25 MG tablet Take 0.25 mg by mouth as needed for anxiety.    Marland Kitchen amphetamine-dextroamphetamine (ADDERALL XR) 30 MG 24 hr capsule Take 30 mg by mouth daily with breakfast.     . amphetamine-dextroamphetamine (ADDERALL) 10 MG tablet Take 10 mg by mouth as needed.    . AZO-CRANBERRY PO Take by mouth as needed.    . cetirizine (ZYRTEC) 10 MG tablet Take 10 mg by mouth 2 (two) times daily.     . clomiPRAMINE (ANAFRANIL) 75 MG capsule Take 150 mg by mouth at bedtime.  3  . colestipol (COLESTID) 1 g tablet TAKE 2 TABLETS (2 G TOTAL) BY MOUTH 2 (TWO) TIMES DAILY. 180 tablet 1  . esomeprazole (NEXIUM) 40 MG capsule Take 1 capsule (40 mg total) by mouth 2 (two) times daily before a meal. 60 capsule 3  . famotidine (PEPCID) 40 MG tablet Take 1 tablet (40 mg total) by mouth 2 (two) times daily. 60 tablet 2  . hyoscyamine (LEVSIN SL) 0.125 MG SL tablet TAKE 1 TABLET (0.125 MG TOTAL) BY MOUTH EVERY 6 (SIX) HOURS AS NEEDED. *NOT COVERED 120 tablet 1  . lamoTRIgine (LAMICTAL) 150 MG tablet Take 150 mg by mouth daily.    Marland Kitchen levonorgestrel (MIRENA) 20 MCG/24HR IUD 1 each by Intrauterine route once.    . methocarbamol (ROBAXIN) 500 MG tablet TAKE 1 TABLET TWICE A DAY AS NEEDED, LIMIT 1-2 TREATMENTS PER WEEK  0  . nitrofurantoin, macrocrystal-monohydrate, (MACROBID) 100 MG capsule Take 100 mg by mouth as needed.    Marland Kitchen omeprazole (PRILOSEC) 40 MG capsule Take 1 capsule (40 mg total) by mouth daily. 60 capsule 3  . ondansetron (ZOFRAN) 4 MG tablet Take 1 tablet (4 mg total) by mouth every 6 (six) hours as needed for nausea or  vomiting. 30 tablet 1  . OVER THE COUNTER MEDICATION Trigger point injections in the scalp of  decadron , Lidocaine, marcaine -for chronic migraines as needed  Gets very 2 months    . pantoprazole (PROTONIX) 40 MG tablet TAKE 30- 60 MIN BEFORE YOUR FIRST AND LAST MEALS OF THE DAY 180 tablet 0  .  pramipexole (MIRAPEX) 1 MG tablet Take 1 mg by mouth at bedtime.    . promethazine (PHENERGAN) 25 MG tablet Take 25 mg by mouth every 6 (six) hours as needed for nausea or vomiting (for migraine headaches).     . rifaximin (XIFAXAN) 550 MG TABS tablet Take 1 tablet (550 mg total) by mouth 3 (three) times daily. 42 tablet 0  . sertraline (ZOLOFT) 100 MG tablet Take 100 mg by mouth every morning.     . valACYclovir (VALTREX) 1000 MG tablet Take 1,000 mg by mouth as needed.    . Vitamin D, Ergocalciferol, (DRISDOL) 1.25 MG (50000 UT) CAPS capsule Take 1 capsule (50,000 Units total) by mouth every 7 (seven) days. 4 capsule 0  . zonisamide (ZONEGRAN) 100 MG capsule Take 2 capsules by mouth daily.   1   No current facility-administered medications for this visit.    Allergies as of 12/30/2019 - Review Complete 10/15/2019  Allergen Reaction Noted  . Other Itching and Other (See Comments) 11/01/2011  . Codeine Other (See Comments) 07/11/2011  . Hydrocodone Other (See Comments) 07/11/2011  . Oxycodone Other (See Comments) 07/11/2011  . Ultram [tramadol hcl] Other (See Comments) 07/11/2011  . Vicodin [hydrocodone-acetaminophen] Other (See Comments) 07/11/2011    Family History  Problem Relation Age of Onset  . Hypertension Mother   . Hyperlipidemia Mother   . Thyroid disease Mother   . Depression Mother   . Anxiety disorder Mother   . Obesity Mother   . Heart disease Maternal Grandmother   . Prostate cancer Maternal Grandfather   . Stomach cancer Neg Hx   . Rectal cancer Neg Hx   . Colon cancer Neg Hx   . Colon polyps Neg Hx   . Esophageal cancer Neg Hx     Social History   Socioeconomic  History  . Marital status: Married    Spouse name: Eloise Asher  . Number of children: 2  . Years of education: Not on file  . Highest education level: Not on file  Occupational History  . Occupation: Disabled  Tobacco Use  . Smoking status: Never Smoker  . Smokeless tobacco: Never Used  . Tobacco comment: smoked a few cigs  in college at a bar but never smoked for a year straight   Substance and Sexual Activity  . Alcohol use: Not Currently    Alcohol/week: 1.0 standard drinks    Types: 1 Cans of beer per week    Comment: socially -none in 1 1/2 years   . Drug use: Not Currently    Types: Other-see comments, Marijuana    Comment: in college  . Sexual activity: Yes    Birth control/protection: I.U.D.  Other Topics Concern  . Not on file  Social History Narrative  . Not on file   Social Determinants of Health   Financial Resource Strain:   . Difficulty of Paying Living Expenses: Not on file  Food Insecurity:   . Worried About Charity fundraiser in the Last Year: Not on file  . Ran Out of Food in the Last Year: Not on file  Transportation Needs:   . Lack of Transportation (Medical): Not on file  . Lack of Transportation (Non-Medical): Not on file  Physical Activity:   . Days of Exercise per Week: Not on file  . Minutes of Exercise per Session: Not on file  Stress:   . Feeling of Stress : Not on file  Social Connections:   . Frequency of Communication  with Friends and Family: Not on file  . Frequency of Social Gatherings with Friends and Family: Not on file  . Attends Religious Services: Not on file  . Active Member of Clubs or Organizations: Not on file  . Attends Archivist Meetings: Not on file  . Marital Status: Not on file  Intimate Partner Violence:   . Fear of Current or Ex-Partner: Not on file  . Emotionally Abused: Not on file  . Physically Abused: Not on file  . Sexually Abused: Not on file    Complete physical exam not performed due to the  limits inherent in a telehealth encounter.  Gen: Awake, alert, and oriented, and well communicative. HEENT: EOMI, non-icteric sclera, NCAT, MMM  Neck: Normal movement of head and neck  Pulm: No labored breathing, speaking in full sentences without conversational dyspnea  Derm: No apparent lesions or bruising in visible field  MS: Moves all visible extremities without noticeable abnormality  Psych: Pleasant, cooperative, normal speech, thought processing seemingly intact     Janaria Mccammon L. Tarri Glenn, MD, MPH [facility] 12/30/2019, 3:58 PM

## 2019-12-30 NOTE — Patient Instructions (Addendum)
-   Continue hyoscyamine four times a day as needed - Continue colestipol 2g twice a day - Imodium as needed - Continue Nexium OTC as tolerated Continue famotidine 20 mg twice a day - Elevated the Head of the bed - Avoid night time eating - Stop eating 3-4 hours prior to bedtime - We will refer you to nutritionist at [facility] -We have made you an appointment to see Dr. Bryan Lemma for a virtual visit to discuss TIF.  -Screening colonoscopy due 2030 -I've encourged her to work on stress reduction practices: yoga, meditation, diaphragmatic breathing

## 2020-01-02 ENCOUNTER — Encounter: Payer: Self-pay | Admitting: Gastroenterology

## 2020-01-02 ENCOUNTER — Encounter: Payer: Self-pay | Admitting: Emergency Medicine

## 2020-01-15 ENCOUNTER — Other Ambulatory Visit: Payer: Self-pay | Admitting: Gastroenterology

## 2020-02-11 ENCOUNTER — Other Ambulatory Visit: Payer: Self-pay

## 2020-02-12 ENCOUNTER — Ambulatory Visit: Payer: Medicare HMO | Admitting: Obstetrics & Gynecology

## 2020-02-12 ENCOUNTER — Encounter: Payer: Self-pay | Admitting: Obstetrics & Gynecology

## 2020-02-12 VITALS — BP 120/80 | Ht 65.0 in | Wt 270.0 lb

## 2020-02-12 DIAGNOSIS — Z30431 Encounter for routine checking of intrauterine contraceptive device: Secondary | ICD-10-CM | POA: Diagnosis not present

## 2020-02-12 DIAGNOSIS — Z01419 Encounter for gynecological examination (general) (routine) without abnormal findings: Secondary | ICD-10-CM | POA: Diagnosis not present

## 2020-02-12 DIAGNOSIS — Z6841 Body Mass Index (BMI) 40.0 and over, adult: Secondary | ICD-10-CM | POA: Diagnosis not present

## 2020-02-12 DIAGNOSIS — K219 Gastro-esophageal reflux disease without esophagitis: Secondary | ICD-10-CM

## 2020-02-12 NOTE — Progress Notes (Signed)
Gloria Rodriguez [birth year] [id number]   History:    44 y.o.  Remarried x 10 yrs.  G18P2L2  Oldest daughter 66 yo, youngest 64 yo.  RP:  Established patient for annual gyn exam   HPI:  Mirena IUD x >5 yrs.  Menses light normal.  No pelvic pain.  Rarely sexually active.  Will use condoms until IUD is changed.  Breasts wnl.  BMI 44.93.  Severe GERD causing SOB and H/As.  Treatment caused diarrhea.  Food intolerances now.  Limited foods she tolerates, will see a specialist to improve her nutrition.  Past medical history,surgical history, family history and social history were all reviewed and documented in the EPIC chart.  Gynecologic History No LMP recorded. (Menstrual status: IUD).  Obstetric History OB History  Gravida Para Term Preterm AB Living  2 2 2  0 0 2  SAB TAB Ectopic Multiple Live Births  0 0 0 0 2    # Outcome Date GA Lbr Len/2nd Weight Sex Delivery Anes PTL Lv  2 Term 07/11/11 [redacted]w[redacted]d  5 lb 3.4 oz (2.365 kg) F CS-LTranv EPI  LIV  1 Term 01/2001 [redacted]w[redacted]d  7 lb 13 oz (3.544 kg) F CS-LTranv Spinal N LIV     Birth Comments: c/s for HSV outbreak     ROS: A ROS was performed and pertinent positives and negatives are included in the history.  GENERAL: No fevers or chills. HEENT: No change in vision, no earache, sore throat or sinus congestion. NECK: No pain or stiffness. CARDIOVASCULAR: No chest pain or pressure. No palpitations. PULMONARY: No shortness of breath, cough or wheeze. GASTROINTESTINAL: No abdominal pain, nausea, vomiting or diarrhea, melena or bright red blood per rectum. GENITOURINARY: No urinary frequency, urgency, hesitancy or dysuria. MUSCULOSKELETAL: No joint or muscle pain, no back pain, no recent trauma. DERMATOLOGIC: No rash, no itching, no lesions. ENDOCRINE: No polyuria, polydipsia, no heat or cold intolerance. No recent change in weight. HEMATOLOGICAL: No anemia or easy bruising or bleeding. NEUROLOGIC: No headache, seizures, numbness, tingling or weakness.  PSYCHIATRIC: No depression, no loss of interest in normal activity or change in sleep pattern.     Exam:   BP 120/80   Ht 5\' 5"  (1.651 m)   Wt 270 lb (122.5 kg)   BMI 44.93 kg/m   Body mass index is 44.93 kg/m.  General appearance : Well developed well nourished female. No acute distress HEENT: Eyes: no retinal hemorrhage or exudates,  Neck supple, trachea midline, no carotid bruits, no thyroidmegaly Lungs: Clear to auscultation, no rhonchi or wheezes, or rib retractions  Heart: Regular rate and rhythm, no murmurs or gallops Breast:Examined in sitting and supine position were symmetrical in appearance, no palpable masses or tenderness,  no skin retraction, no nipple inversion, no nipple discharge, no skin discoloration, no axillary or supraclavicular lymphadenopathy Abdomen: no palpable masses or tenderness, no rebound or guarding Extremities: no edema or skin discoloration or tenderness  Pelvic: Vulva: Normal             Vagina: No gross lesions or discharge  Cervix: No gross lesions or discharge. IUD strings visible at [facility].  Pap reflex done.  Uterus  AV, normal size, shape and consistency, non-tender and mobile  Adnexa  Without masses or tenderness  Anus: Normal   Assessment/Plan:  44 y.o. female for annual exam   1. Encounter for routine gynecological examination with Papanicolaou smear of cervix Normal gynecologic exam.  Pap reflex done.  Breast exam normal.  Screening  mammogram to schedule now.  Health labs with family physician.  2. Encounter for routine checking of intrauterine contraceptive device (IUD) Mirena IUD in good position but overdue for change.  F/U removal/Reinsertion of Mirena IUD.  Will use condoms in the meantime.  3. Class 3 severe obesity due to excess calories with serious comorbidity and body mass index (BMI) of 40.0 to 44.9 in adult [facility]) Difficulties with nutrition, will see a nutritionist.  Work on a lower calorie/carb diet with nutritionist.   Aerobic physical activities 5 times a week and light weightlifting every 2 days recommended.  Princess Bruins MD, 2:54 PM 02/12/2020

## 2020-02-13 LAB — PAP IG W/ RFLX HPV ASCU

## 2020-02-18 ENCOUNTER — Encounter: Payer: Self-pay | Admitting: Obstetrics & Gynecology

## 2020-02-18 NOTE — Patient Instructions (Signed)
1. Encounter for routine gynecological examination with Papanicolaou smear of cervix Normal gynecologic exam.  Pap reflex done.  Breast exam normal.  Screening mammogram to schedule now.  Health labs with family physician.  2. Encounter for routine checking of intrauterine contraceptive device (IUD) Mirena IUD in good position but overdue for change.  F/U removal/Reinsertion of Mirena IUD.  Will use condoms in the meantime.  3. Class 3 severe obesity due to excess calories with serious comorbidity and body mass index (BMI) of 40.0 to 44.9 in adult [facility]) Difficulties with nutrition, will see a nutritionist.  Work on a lower calorie/carb diet with nutritionist.  Aerobic physical activities 5 times a week and light weightlifting every 2 days recommended.  Rissa, it was a pleasure seeing you today!  I will inform you of your results as soon as they are available.

## 2020-02-19 DIAGNOSIS — J3081 Allergic rhinitis due to animal (cat) (dog) hair and dander: Secondary | ICD-10-CM | POA: Diagnosis not present

## 2020-02-19 DIAGNOSIS — J301 Allergic rhinitis due to pollen: Secondary | ICD-10-CM | POA: Diagnosis not present

## 2020-02-19 DIAGNOSIS — T781XXD Other adverse food reactions, not elsewhere classified, subsequent encounter: Secondary | ICD-10-CM | POA: Diagnosis not present

## 2020-02-19 DIAGNOSIS — J3089 Other allergic rhinitis: Secondary | ICD-10-CM | POA: Diagnosis not present

## 2020-02-24 DIAGNOSIS — G43719 Chronic migraine without aura, intractable, without status migrainosus: Secondary | ICD-10-CM | POA: Diagnosis not present

## 2020-02-24 DIAGNOSIS — G518 Other disorders of facial nerve: Secondary | ICD-10-CM | POA: Diagnosis not present

## 2020-02-24 DIAGNOSIS — M791 Myalgia, unspecified site: Secondary | ICD-10-CM | POA: Diagnosis not present

## 2020-02-24 DIAGNOSIS — M542 Cervicalgia: Secondary | ICD-10-CM | POA: Diagnosis not present

## 2020-02-25 ENCOUNTER — Other Ambulatory Visit: Payer: Self-pay | Admitting: Gastroenterology

## 2020-02-25 ENCOUNTER — Ambulatory Visit: Payer: Medicare HMO | Admitting: Obstetrics & Gynecology

## 2020-03-01 ENCOUNTER — Encounter: Payer: Self-pay | Admitting: Gastroenterology

## 2020-03-01 ENCOUNTER — Ambulatory Visit (INDEPENDENT_AMBULATORY_CARE_PROVIDER_SITE_OTHER): Payer: Medicare HMO | Admitting: Gastroenterology

## 2020-03-01 DIAGNOSIS — R197 Diarrhea, unspecified: Secondary | ICD-10-CM

## 2020-03-01 DIAGNOSIS — E66813 Obesity, class 3: Secondary | ICD-10-CM

## 2020-03-01 DIAGNOSIS — K2100 Gastro-esophageal reflux disease with esophagitis, without bleeding: Secondary | ICD-10-CM | POA: Diagnosis not present

## 2020-03-01 DIAGNOSIS — Z6841 Body Mass Index (BMI) 40.0 and over, adult: Secondary | ICD-10-CM | POA: Diagnosis not present

## 2020-03-01 NOTE — Progress Notes (Signed)
P  Chief Complaint:   GERD, TIF evaluation  Referring Physician: [name], MD  Due to current restrictions/limitations of in-office visits due to the COVID-19 pandemic, this scheduled clinical appointment was converted to a telehealth virtual consultation using Doximity.  -Time: 35 minutes -The patient did consent to this virtual visit and is aware of possible charges through their insurance for this visit.  -Names of all parties present: Gloria Rodriguez (patient), Gerrit Heck, DO, [facility] (physician) -Patient location: Home -Physician location: Office  HPI:    Gloria Rodriguez is a 44 year old female with a longstanding history of reflux as outlined below, referred to me by [name] for evaluation of possible antireflux intervention with Transoral Incisionless Fundoplication (TIF) with a goal to stop or significantly reduce acid suppression therapy.  GERD history: - Previously on Nexium 40 mg daily x 7 years    - EGD (02/20/19, [name]): LA Grade A esophagitis-biopsies with mild vascular congestion, squamous ballooning, surface parakeratosis; negative for eosinophilic esophagitis    - started pantoprazole after her EGD    - Esophageal manometry 10/2019:  normal relaxation of the EG junction.  Fragmented peristalsis.  Complete bolus clearance in 4/10 swallows. Otherwise, 90% propagated normally.    - 24-hour pH probe (on PPI; 10/2019): slightly increased esophageal acid exposure with a DeMeester score of 21.1; acid exposure is abnormally predominant in the supine position.  Negative symptom correlation. -Stopped taking Protonix in 11/2019, with return of index reflux symptoms.  Started using Nexium 1-2 times/day again at that time, then transitioned to Prilosec for ongoing sxs and Pepcid.   -Index symptoms: HB, regurgitation, increased throat clearing, increased belching; breakthrough sxs with any missed doses. No dysphagia.  -Medications trialed: Omeprazole, Pantoprazole,  esomeprazole -Current medications: Pepcid 40 mg bid -Complications: Diarrhea exacerbation with PPIs  Separately, she continues to follow with [name] for IBS-D/ ?  Bile acid diarrhea.  Please see previous notes for evaluation and treatment plan regarding the separate GI issue.  She recently met with a new Dietician to assist in this as well.   Review of systems:     No chest pain, no SOB, no fevers, no urinary sx   Past Medical History:  Diagnosis Date  . ADD (attention deficit disorder)   . Allergy    allergy meds daily   . Anxiety    hx bipolar, ADHD  . Arthritis    left shoulder   . Bruxism   . Chronic kidney disease    kindney stones, interstitial cystitis history  . Chronic migraine   . Depression    states well controlled  . Diarrhea   . GERD (gastroesophageal reflux disease)    hx gallstones and pancreatitis per pt  . Headache(784.0)   . HSV infection    on Valtrex suppression  . Hypotension    normal rens 80/60  . Kidney problem   . Lactose intolerance   . No pertinent past medical history    states with gall bladder attacks with chest pain, vomiting and diarrhes    EKG  in EPIC  . OCD (obsessive compulsive disorder)   . Periodic limb movement   . Postpartum care and examination of lactating mother 07/13/2011  . Sleep apnea    NO SLEEP APNEA BUT STATES HAS PERIODIC LIMB MOVEMENT DISORDER_ WAS IN MIRAPEX PRE PREGNANCY  . Sleep disorder 03/03/2014  . SOB (shortness of breath)   . Swelling     Patient's surgical history, family medical history, social  history, medications and allergies were all reviewed in Epic    Current Outpatient Medications  Medication Sig Dispense Refill  . ALPRAZolam (XANAX) 0.25 MG tablet Take 0.25 mg by mouth as needed for anxiety.    Marland Kitchen amphetamine-dextroamphetamine (ADDERALL XR) 30 MG 24 hr capsule Take 30 mg by mouth daily with breakfast.     . amphetamine-dextroamphetamine (ADDERALL) 10 MG tablet Take 10 mg by mouth as needed.     . AZO-CRANBERRY PO Take by mouth as needed.    . clomiPRAMINE (ANAFRANIL) 75 MG capsule Take 150 mg by mouth at bedtime.  3  . famotidine (PEPCID) 40 MG tablet TAKE 1 TABLET BY MOUTH TWICE A DAY 180 tablet 3  . hyoscyamine (LEVSIN SL) 0.125 MG SL tablet TAKE 1 TABLET (0.125 MG TOTAL) BY MOUTH EVERY 6 (SIX) HOURS AS NEEDED. *NOT COVERED 120 tablet 1  . lamoTRIgine (LAMICTAL) 150 MG tablet Take 150 mg by mouth daily.    Marland Kitchen levocetirizine (XYZAL) 5 MG tablet Take 5 mg by mouth at bedtime.    Marland Kitchen levonorgestrel (MIRENA) 20 MCG/24HR IUD 1 each by Intrauterine route once.    . methocarbamol (ROBAXIN) 500 MG tablet TAKE 1 TABLET TWICE A DAY AS NEEDED, LIMIT 1-2 TREATMENTS PER WEEK  0  . pramipexole (MIRAPEX) 1 MG tablet Take 1 mg by mouth at bedtime.    . promethazine (PHENERGAN) 25 MG tablet Take 25 mg by mouth every 6 (six) hours as needed for nausea or vomiting (for migraine headaches).     . sertraline (ZOLOFT) 100 MG tablet Take 100 mg by mouth every morning.     . valACYclovir (VALTREX) 1000 MG tablet Take 1,000 mg by mouth as needed.    . Vitamin D, Ergocalciferol, (DRISDOL) 1.25 MG (50000 UT) CAPS capsule Take 1 capsule (50,000 Units total) by mouth every 7 (seven) days. 4 capsule 0  . zonisamide (ZONEGRAN) 100 MG capsule Take 2 capsules by mouth daily.   1  . nitrofurantoin, macrocrystal-monohydrate, (MACROBID) 100 MG capsule Take 100 mg by mouth as needed.    Marland Kitchen OVER THE COUNTER MEDICATION Trigger point injections in the scalp of  decadron , Lidocaine, marcaine -for chronic migraines as needed  Gets very 2 months    . rifaximin (XIFAXAN) 550 MG TABS tablet Take 1 tablet (550 mg total) by mouth 3 (three) times daily. 42 tablet 0   No current facility-administered medications for this visit.    Physical Exam:    Complete physical exam not completed due to the nature of this telehealth communication.   Gen: Awake, alert, and oriented, and well communicative. HEENT: EOMI, non-icteric sclera,  NCAT, MMM Neck: Normal movement of head and neck Pulm: No labored breathing, speaking in full sentences without conversational dyspnea Derm: No apparent lesions or bruising in visible field MS: Moves all visible extremities without noticeable abnormality Psych: Pleasant, cooperative, normal speech, thought processing seemingly intact   IMPRESSION and PLAN:    1) GERD with erosive esophagitis 2) Obesity (BMI 6.42) 44 year old female with longstanding history of reflux incompletely controlled with acid suppression therapy and breakthrough with any missed doses.  Has been somewhat intolerant to PPI therapy due to exacerbation of underlying diarrhea, so only treated with H2 RA at this point.  Discussed pathophysiology reflux at length today, to include ongoing treatment options of medications vs antireflux surgery.  Discussed pros/cons of TIF at length, but given BMI >35, this precludes TIF as an option by current literature.  Instead, discussed referral to Bariatric  Surgery clinic at [place] to discuss RYGB vs gastric sleeve as a means to better control reflux.  She has tried losing weight in the past, but has been unsuccessful, partly due to exertional migraines which limit her exercise component.  -Resume current aspiration therapy -Dietary/lifestyle modifications, including HOB elevation -Current BMI precludes TIF as an option -Refer to Dr. Kae Heller at [place] for consideration of bariatric surgery as a means to better control reflux along with weight loss benefits  3) Diarrhea: -Continue close follow-up with dietitian -Can follow-up with [name] for ongoing evaluation and treatment  I spent 35 minutes of time, including in depth chart review, independent review of results as outlined above, communicating results with the patient directly, face-to-face time with the patient, coordinating care, and ordering studies and medications as appropriate, and documentation.   Referral to Dr. Kae Heller at  [facility] ,DO, [facility] 03/01/2020, 1:46 PM

## 2020-03-01 NOTE — Patient Instructions (Addendum)
If you are age 45 or older, your body mass index should be between 23-30. Your There is no height or weight on file to calculate BMI. If this is out of the aforementioned range listed, please consider follow up with your Primary Care Provider.  If you are age 70 or younger, your body mass index should be between 19-25. Your There is no height or weight on file to calculate BMI. If this is out of the aformentioned range listed, please consider follow up with your Primary Care Provider.   We are referring you to Dr Kae Heller at [facility]. They will contact you with an appointment.[facility] is located at [address], [address]. If they do not contact you within 1 week to schedule please contact our office at [phone].  Due to recent changes in healthcare laws, you may see the results of your imaging and laboratory studies on MyChart before your provider has had a chance to review them.  We understand that in some cases there may be results that are confusing or concerning to you. Not all laboratory results come back in the same time frame and the provider may be waiting for multiple results in order to interpret others.  Please give [place] 48 hours in order for your provider to thoroughly review all the results before contacting the office for clarification of your results.   Thank you for choosing me and [facility]

## 2020-03-02 ENCOUNTER — Ambulatory Visit (HOSPITAL_COMMUNITY): Admit: 2020-03-02 | Payer: Medicare HMO | Admitting: Gastroenterology

## 2020-03-02 ENCOUNTER — Encounter (HOSPITAL_COMMUNITY): Payer: Self-pay

## 2020-03-02 SURGERY — MANOMETRY, ANORECTAL

## 2020-03-11 ENCOUNTER — Ambulatory Visit: Payer: Medicare HMO

## 2020-03-11 ENCOUNTER — Ambulatory Visit: Payer: Medicare HMO | Attending: Internal Medicine

## 2020-03-11 DIAGNOSIS — Z23 Encounter for immunization: Secondary | ICD-10-CM

## 2020-03-11 NOTE — Progress Notes (Signed)
   Covid-19 Vaccination Clinic  Name:  Gloria Rodriguez    MRN: [id number] DOB: 1976-05-16  03/11/2020  [name] was observed post Covid-19 immunization for 15 minutes without incident. She was provided with Vaccine Information Sheet and instruction to access the V-Safe system.   [name] was instructed to call 911 with any severe reactions post vaccine: Marland Kitchen Difficulty breathing  . Swelling of face and throat  . A fast heartbeat  . A bad rash all over body  . Dizziness and weakness   Immunizations Administered    Name Date Dose VIS Date Route   Pfizer COVID-19 Vaccine 03/11/2020 11:29 AM 0.3 mL 11/21/2019 Intramuscular   Manufacturer: [employer], [employer]   Lot: [id number]   [place]: [id number]

## 2020-03-17 DIAGNOSIS — G8929 Other chronic pain: Secondary | ICD-10-CM | POA: Diagnosis not present

## 2020-03-17 DIAGNOSIS — F909 Attention-deficit hyperactivity disorder, unspecified type: Secondary | ICD-10-CM | POA: Diagnosis not present

## 2020-03-17 DIAGNOSIS — R69 Illness, unspecified: Secondary | ICD-10-CM | POA: Diagnosis not present

## 2020-03-17 DIAGNOSIS — F419 Anxiety disorder, unspecified: Secondary | ICD-10-CM | POA: Diagnosis not present

## 2020-03-17 DIAGNOSIS — Z6841 Body Mass Index (BMI) 40.0 and over, adult: Secondary | ICD-10-CM | POA: Diagnosis not present

## 2020-03-17 DIAGNOSIS — J309 Allergic rhinitis, unspecified: Secondary | ICD-10-CM | POA: Diagnosis not present

## 2020-03-17 DIAGNOSIS — G43909 Migraine, unspecified, not intractable, without status migrainosus: Secondary | ICD-10-CM | POA: Diagnosis not present

## 2020-03-17 DIAGNOSIS — B009 Herpesviral infection, unspecified: Secondary | ICD-10-CM | POA: Diagnosis not present

## 2020-03-17 DIAGNOSIS — G4761 Periodic limb movement disorder: Secondary | ICD-10-CM | POA: Diagnosis not present

## 2020-04-05 ENCOUNTER — Ambulatory Visit: Payer: Medicare HMO | Attending: Internal Medicine

## 2020-04-05 DIAGNOSIS — Z23 Encounter for immunization: Secondary | ICD-10-CM

## 2020-04-05 NOTE — Progress Notes (Signed)
   Covid-19 Vaccination Clinic  Name:  Gloria Rodriguez    MRN: [id number] DOB: [birth year]  04/05/2020  [name] was observed post Covid-19 immunization for 15 minutes without incident. She was provided with Vaccine Information Sheet and instruction to access the V-Safe system.   [name] was instructed to call 911 with any severe reactions post vaccine: Marland Kitchen Difficulty breathing  . Swelling of face and throat  . A fast heartbeat  . A bad rash all over body  . Dizziness and weakness   Immunizations Administered    Name Date Dose VIS Date Route   Pfizer COVID-19 Vaccine 04/05/2020  9:54 AM 0.3 mL 02/04/2019 Intramuscular   Manufacturer: [place]   Lot: [id number]   [place]: [id number]

## 2020-04-06 DIAGNOSIS — F900 Attention-deficit hyperactivity disorder, predominantly inattentive type: Secondary | ICD-10-CM | POA: Diagnosis not present

## 2020-04-06 DIAGNOSIS — F429 Obsessive-compulsive disorder, unspecified: Secondary | ICD-10-CM | POA: Diagnosis not present

## 2020-04-06 DIAGNOSIS — R69 Illness, unspecified: Secondary | ICD-10-CM | POA: Diagnosis not present

## 2020-04-15 DIAGNOSIS — N189 Chronic kidney disease, unspecified: Secondary | ICD-10-CM | POA: Diagnosis not present

## 2020-04-15 DIAGNOSIS — K219 Gastro-esophageal reflux disease without esophagitis: Secondary | ICD-10-CM | POA: Diagnosis not present

## 2020-04-15 DIAGNOSIS — R69 Illness, unspecified: Secondary | ICD-10-CM | POA: Diagnosis not present

## 2020-04-15 DIAGNOSIS — R5383 Other fatigue: Secondary | ICD-10-CM | POA: Diagnosis not present

## 2020-04-15 DIAGNOSIS — K529 Noninfective gastroenteritis and colitis, unspecified: Secondary | ICD-10-CM | POA: Diagnosis not present

## 2020-05-28 DIAGNOSIS — G43719 Chronic migraine without aura, intractable, without status migrainosus: Secondary | ICD-10-CM | POA: Diagnosis not present

## 2020-05-28 DIAGNOSIS — G518 Other disorders of facial nerve: Secondary | ICD-10-CM | POA: Diagnosis not present

## 2020-05-28 DIAGNOSIS — M542 Cervicalgia: Secondary | ICD-10-CM | POA: Diagnosis not present

## 2020-05-28 DIAGNOSIS — M791 Myalgia, unspecified site: Secondary | ICD-10-CM | POA: Diagnosis not present

## 2020-06-24 DIAGNOSIS — Z1329 Encounter for screening for other suspected endocrine disorder: Secondary | ICD-10-CM | POA: Diagnosis not present

## 2020-06-24 DIAGNOSIS — G43909 Migraine, unspecified, not intractable, without status migrainosus: Secondary | ICD-10-CM | POA: Diagnosis not present

## 2020-06-24 DIAGNOSIS — E669 Obesity, unspecified: Secondary | ICD-10-CM | POA: Diagnosis not present

## 2020-06-24 DIAGNOSIS — Z1159 Encounter for screening for other viral diseases: Secondary | ICD-10-CM | POA: Diagnosis not present

## 2020-06-28 ENCOUNTER — Other Ambulatory Visit: Payer: Self-pay | Admitting: Surgery

## 2020-06-28 ENCOUNTER — Other Ambulatory Visit (HOSPITAL_COMMUNITY): Payer: Self-pay | Admitting: Surgery

## 2020-06-28 DIAGNOSIS — Z139 Encounter for screening, unspecified: Secondary | ICD-10-CM | POA: Diagnosis not present

## 2020-06-29 DIAGNOSIS — Z1329 Encounter for screening for other suspected endocrine disorder: Secondary | ICD-10-CM | POA: Diagnosis not present

## 2020-06-30 ENCOUNTER — Encounter: Payer: Self-pay | Admitting: *Deleted

## 2020-07-05 ENCOUNTER — Other Ambulatory Visit: Payer: Self-pay

## 2020-07-05 ENCOUNTER — Ambulatory Visit (HOSPITAL_COMMUNITY)
Admission: RE | Admit: 2020-07-05 | Discharge: 2020-07-05 | Disposition: A | Discharge status: Home / Self Care | Payer: Medicare HMO | Source: Ambulatory Visit | Attending: Surgery | Admitting: Surgery

## 2020-07-05 DIAGNOSIS — Z01818 Encounter for other preprocedural examination: Secondary | ICD-10-CM | POA: Diagnosis not present

## 2020-07-05 DIAGNOSIS — Z9884 Bariatric surgery status: Secondary | ICD-10-CM | POA: Diagnosis not present

## 2020-07-06 ENCOUNTER — Encounter: Payer: Self-pay | Admitting: Anesthesiology

## 2020-07-20 DIAGNOSIS — R5383 Other fatigue: Secondary | ICD-10-CM | POA: Diagnosis not present

## 2020-07-27 ENCOUNTER — Encounter: Payer: Self-pay | Admitting: Anesthesiology

## 2020-07-28 ENCOUNTER — Ambulatory Visit: Payer: Medicare HMO | Admitting: Obstetrics & Gynecology

## 2020-07-30 DIAGNOSIS — Z20822 Contact with and (suspected) exposure to covid-19: Secondary | ICD-10-CM | POA: Diagnosis not present

## 2020-08-09 ENCOUNTER — Encounter: Payer: Self-pay | Admitting: Obstetrics & Gynecology

## 2020-08-09 ENCOUNTER — Ambulatory Visit (INDEPENDENT_AMBULATORY_CARE_PROVIDER_SITE_OTHER): Payer: Medicare HMO | Admitting: Obstetrics & Gynecology

## 2020-08-09 ENCOUNTER — Other Ambulatory Visit: Payer: Self-pay

## 2020-08-09 VITALS — BP 132/80

## 2020-08-09 DIAGNOSIS — Z30433 Encounter for removal and reinsertion of intrauterine contraceptive device: Secondary | ICD-10-CM

## 2020-08-09 NOTE — Progress Notes (Signed)
    Gloria Rodriguez 03-23-1976 [id number]        44 y.o.  [id number] Remarried  RP: Removal and insertion of Mirena IUD  HPI: Overdue for Mirena IUD change.  Using condoms with IC.  No BTB.  No pelvic pain.  Planning a Gastric bypass surgery.   OB History  Gravida Para Term Preterm AB Living  2 2 2  0 0 2  SAB TAB Ectopic Multiple Live Births  0 0 0 0 2    # Outcome Date GA Lbr Len/2nd Weight Sex Delivery Anes PTL Lv  2 Term 07/11/11 [redacted]w[redacted]d  5 lb 3.4 oz (2.365 kg) F CS-LTranv EPI  LIV  1 Term 01/2001 [redacted]w[redacted]d  7 lb 13 oz (3.544 kg) F CS-LTranv Spinal N LIV     Birth Comments: c/s for HSV outbreak    Past medical history,surgical history, problem list, medications, allergies, family history and social history were all reviewed and documented in the EPIC chart.   Directed ROS with pertinent positives and negatives documented in the history of present illness/assessment and plan.  Exam:  Vitals:   08/09/20 0914  BP: 132/80   General appearance:  Normal                                                                    IUD procedure note       Patient presented to the office today for removal/placement of Mirena IUD. The patient had previously been provided with literature information on this method of contraception. The risks benefits and pros and cons were discussed and all her questions were answered. She is fully aware that this form of contraception is 99% effective and is good for 5 years.  Pelvic exam: Vulva normal Vagina: No lesions or discharge Cervix: No lesions or discharge.  Easy IUD removal by pulling on the strings.  No Cx.  IUD intact and complete. Uterus: AV position Adnexa: No masses or tenderness Rectal exam: Not done  The cervix was cleansed with Betadine solution. Hurricane spray on the cervix.  A single-tooth tenaculum was placed on the anterior cervical lip. The IUD was shown to the patient and inserted in a sterile fashion.  Hysterometry with the IUD as being  inserted was 7 cm.  The IUD string was trimmed. The single-tooth tenaculum was removed. Patient was instructed to return back to the office in one month for follow up.        Assessment/Plan:  44 y.o. G2P2002   1. Encounter for IUD removal and reinsertion Overdue for Mirena IUD change.  Has been using condoms for contraception.  Easy IUD removal without complication.  IUD intact and complete.  New Mirena IUD inserted without difficulty or complication.  Well-tolerated by patient.  Postprocedure precautions reviewed.  Follow-up in 4 weeks for IUD check.  Princess Bruins MD, 9:15 AM 08/09/2020

## 2020-08-19 ENCOUNTER — Other Ambulatory Visit: Payer: Self-pay

## 2020-08-19 ENCOUNTER — Encounter: Payer: Medicare HMO | Attending: Surgery | Admitting: Dietician

## 2020-08-19 ENCOUNTER — Encounter: Payer: Self-pay | Admitting: Dietician

## 2020-08-19 DIAGNOSIS — E669 Obesity, unspecified: Secondary | ICD-10-CM

## 2020-08-19 DIAGNOSIS — Z713 Dietary counseling and surveillance: Secondary | ICD-10-CM | POA: Diagnosis not present

## 2020-08-19 DIAGNOSIS — K219 Gastro-esophageal reflux disease without esophagitis: Secondary | ICD-10-CM | POA: Insufficient documentation

## 2020-08-19 NOTE — Progress Notes (Signed)
Nutrition Assessment for Bariatric Surgery Medical Nutrition Therapy   Patient was seen on 08/19/2020 for Pre-Operative Nutrition Assessment. Letter of approval faxed to [facility] bariatric surgery program coordinator on 08/19/2020.   Referral stated Supervised Weight Loss (SWL) visits needed: 0  Planned surgery: RYGB Pt expectation of surgery: to be able to breathe better and help alleviate acid reflux    NUTRITION ASSESSMENT   Anthropometrics  Start weight at [facility]: 265 lbs (date: 08/19/2020) Height: 65 in BMI: 44 kg/m2     Lifestyle & Dietary Hx States she was referred by her GI doctor due to acid reflux. States she has multiple dietary restrictions. Interstitial cystitis (perforated bladder) inhibits patient from eating acidic foods, spicy foods, citrus foods, such as tomatoes, onions, coffee, etc. 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 Patient also has gluten and lactose intolerance, as well as issues with high FODMAP foods. Will have lactose-free dairy products or take Lactaid to help with lactose intolerance. Also likes soy milk. Likes Oikos Pro yogurt and eggs for protein. Can do salmon, asparagus, baby red potatoes, small amounts of chicken, and fruit. Cannot have broccoli, green beans, brussels sprouts, cabbage, garlic, onion due to FODMAPs. Avoids alcohol, may sometimes have ginger ale. Works with a Banker. Typical meal pattern is 2-3 meals per day.   Any previous deficiencies: vitamin D  Micronutrient Nutrition Focused Physical Exam: Hair: no issues observed Eyes: no issues observed Mouth: no issues observed Neck: no issues observed Nails: multiple are spoon shaped (potential iron deficiency)  Skin: no issues observed  24-Hr Dietary Recall First Meal: gluten free cereal + soy milk  Snack: - Second Meal: 2 corn tortillas + 2 eggs  Snack: - Third Meal: 24 oz Oikos Pro yogurt + 6-8 Tbsp  jam Snack: - Beverages: tea w/ monk fruit, water  Estimated Energy Needs Calories: 1600-1800  Carbohydrate: 180-200g Protein: 100-113g   Fat: 53-60g   NUTRITION DIAGNOSIS  Overweight/obesity ([place]-3.3) related to past poor dietary habits and physical inactivity as evidenced by patient w/ planned RYGB surgery following dietary guidelines for continued weight loss.    NUTRITION INTERVENTION  Nutrition counseling (C-1) and education (E-2) to facilitate bariatric surgery goals.  Pre-Op Goals Reviewed with the Patient . Track food and beverage intake (pen and paper, MyFitness Pal, Baritastic app, etc.) . Make healthy food choices while monitoring portion sizes . Consume 3 meals per day or try to eat every 3-5 hours . Avoid concentrated sugars and fried foods . Keep sugar & fat in the single digits per serving on food labels . Practice CHEWING your food (aim for applesauce consistency) . Practice not drinking 15 minutes before, during, and 30 minutes after each meal and snack . Avoid all carbonated beverages (ex: soda, sparkling beverages)  . Limit caffeinated beverages (ex: coffee, tea, energy drinks) . Avoid all sugar-sweetened beverages (ex: regular soda, sports drinks)  . Avoid alcohol  . Aim for 64-100 ounces of FLUID daily (with at least half of fluid intake being plain water)  . Aim for at least 60-80 grams of PROTEIN daily . Look for a liquid protein source that contains ?15 g protein and ?5 g carbohydrate (ex: shakes, drinks, shots) . Make a list of non-food related activities . Physical activity is an important part of a healthy lifestyle so keep it moving! The goal is to reach 150 minutes of exercise per week, including cardiovascular and weight baring activity.  Handouts Provided Include  .  Bariatric Surgery handouts (Nutrition Visits, Pre-Op Goals, Protein Shakes, Vitamins & Minerals)  Learning Style & Readiness for Change Teaching method utilized: Visual & Auditory   Demonstrated degree of understanding via: Teach Back  Barriers to learning/adherence to lifestyle change: None Identified    MONITORING & EVALUATION Dietary intake, weekly physical activity, body weight, and pre-op goals reached at next nutrition visit.   Next Steps Patient is to follow up at [place] for Pre-Op Class (>2 weeks before surgery) for further nutrition education.

## 2020-08-19 NOTE — Patient Instructions (Signed)
It was nice meeting you today! Remember the following main points from today's visit.   Take a multivitamin with iron daily.   Begin working through the [employer] discussed today, starting with:  Finding liquid sources of protein you like/can tolerate.  Eat at least 3 times per day, and monitor your portion sizes. Consider reducing the amount you eat at meals and include snacks as needed in between meals.

## 2020-08-22 DIAGNOSIS — R69 Illness, unspecified: Secondary | ICD-10-CM | POA: Diagnosis not present

## 2020-08-25 ENCOUNTER — Ambulatory Visit: Payer: Medicare HMO | Admitting: Dietician

## 2020-08-25 ENCOUNTER — Encounter: Payer: Self-pay | Admitting: Anesthesiology

## 2020-08-31 ENCOUNTER — Telehealth: Payer: Self-pay | Admitting: *Deleted

## 2020-08-31 DIAGNOSIS — N3010 Interstitial cystitis (chronic) without hematuria: Secondary | ICD-10-CM

## 2020-08-31 DIAGNOSIS — R350 Frequency of micturition: Secondary | ICD-10-CM

## 2020-08-31 NOTE — Telephone Encounter (Signed)
Patient informed, order placed for U/A.

## 2020-08-31 NOTE — Telephone Encounter (Signed)
Patient is scheduled for IUD follow up on 09/06/20, called asking if ultrasound could be ordered due to family history of ovarian cyst. Patient reports for x 6 weeks now she has noticed frequent urination only, no burning and notes this when standing only, states she has to rush to bathroom if not she will wet her clothes. She does have IC and has not seen a urology in years. Patient reports this doesn't feel like her IC, this is new. No pelvic pain, no other symptoms. I suggested she speak with you about this at office visit, however patient wanted to call in advance to get your thoughts? I did explain we do not have ultrasound tech daily. Please advise

## 2020-08-31 NOTE — Telephone Encounter (Signed)
Recommend avoiding all bladder irritants, caffeine products, and increasing her water intake.  Recommend organizing a visit with her Urologist.  Will do a U/A on 9/27th.  Schedule a pelvic US at a later date.

## 2020-09-02 ENCOUNTER — Ambulatory Visit (INDEPENDENT_AMBULATORY_CARE_PROVIDER_SITE_OTHER): Payer: Medicare HMO | Admitting: Psychology

## 2020-09-02 DIAGNOSIS — R69 Illness, unspecified: Secondary | ICD-10-CM | POA: Diagnosis not present

## 2020-09-02 DIAGNOSIS — F509 Eating disorder, unspecified: Secondary | ICD-10-CM

## 2020-09-06 ENCOUNTER — Ambulatory Visit (INDEPENDENT_AMBULATORY_CARE_PROVIDER_SITE_OTHER): Payer: Medicare HMO | Admitting: Obstetrics & Gynecology

## 2020-09-06 ENCOUNTER — Other Ambulatory Visit: Payer: Self-pay

## 2020-09-06 ENCOUNTER — Encounter: Payer: Self-pay | Admitting: Obstetrics & Gynecology

## 2020-09-06 VITALS — BP 126/80

## 2020-09-06 DIAGNOSIS — L723 Sebaceous cyst: Secondary | ICD-10-CM | POA: Diagnosis not present

## 2020-09-06 DIAGNOSIS — Z30431 Encounter for routine checking of intrauterine contraceptive device: Secondary | ICD-10-CM

## 2020-09-06 DIAGNOSIS — N3010 Interstitial cystitis (chronic) without hematuria: Secondary | ICD-10-CM | POA: Diagnosis not present

## 2020-09-06 DIAGNOSIS — R3915 Urgency of urination: Secondary | ICD-10-CM

## 2020-09-06 DIAGNOSIS — R350 Frequency of micturition: Secondary | ICD-10-CM | POA: Diagnosis not present

## 2020-09-06 NOTE — Progress Notes (Signed)
    Gloria Rodriguez 1976/11/29 [id number]        44 y.o.  G2P2002   RP: Mirena IUD check   HPI: Insertion of a new Mirena IUD on 08/09/2020.  Had BTB at first, but no vaginal bleeding x 1 week.  No pelvic pain.  No abnormal vaginal d/c.  No fever.  C/O urinary urgency.  No other UTI Sx. 2 small bumps on the vulva, no pain, no drainage.  Planning a Gastric bypass surgery 10/2020.   OB History  Gravida Para Term Preterm AB Living  2 2 2  0 0 2  SAB TAB Ectopic Multiple Live Births  0 0 0 0 2    # Outcome Date GA Lbr Len/2nd Weight Sex Delivery Anes PTL Lv  2 Term 07/11/11 [redacted]w[redacted]d  5 lb 3.4 oz (2.365 kg) F CS-LTranv EPI  LIV  1 Term 01/2001 [redacted]w[redacted]d  7 lb 13 oz (3.544 kg) F CS-LTranv Spinal N LIV     Birth Comments: c/s for HSV outbreak    Past medical history,surgical history, problem list, medications, allergies, family history and social history were all reviewed and documented in the EPIC chart.   Directed ROS with pertinent positives and negatives documented in the history of present illness/assessment and plan.  Exam:  Vitals:   09/06/20 0954  BP: 126/80   General appearance:  Normal  Gynecologic exam: Vulva: Left vulva and left perianal area with 2 small sebaceous gland cysts.  Speculum:  Cervix/Vagina normal.  IUD strings visible at the EO.  Normal secretions, no blood.  U/A: Yellow clear, nitrites negative, proteins negative, white blood cells 0-5, red blood cells 0-2, bacteria few. Pending urine culture.   Assessment/Plan:  44 y.o. G2P2002   1. IUD check up New Mirena IUD well-tolerated, in good location with no sign of infection. Patient reassured.  2. Urgency of urination Urgency of urination with history of interstitial cystitis. Urine analysis within normal limits. Will wait on urine culture to decide if treatment needed. - Urinalysis,Complete w/RFL Culture  3. IC (interstitial cystitis) Red blood cell only 0-2. White blood cells 0-5. Symptoms unlikely to be  associated with interstitial cystitis. - Urinalysis,Complete w/RFL Culture  4. Sebaceous cyst 2 very small sebaceous gland cysts. Warm soaking suggested or simply observation. Patient reassured.  Princess Bruins MD, 10:00 AM 09/06/2020

## 2020-09-08 LAB — URINALYSIS, COMPLETE W/RFL CULTURE
Bilirubin Urine: NEGATIVE
Glucose, UA: NEGATIVE
Hgb urine dipstick: NEGATIVE
Hyaline Cast: NONE SEEN /LPF
Ketones, ur: NEGATIVE
Leukocyte Esterase: NEGATIVE
Nitrites, Initial: NEGATIVE
Protein, ur: NEGATIVE
Specific Gravity, Urine: 1.015 (ref 1.001–1.03)
pH: 7 (ref 5.0–8.0)

## 2020-09-08 LAB — URINE CULTURE
MICRO NUMBER:: 10998808
Result:: NO GROWTH
SPECIMEN QUALITY:: ADEQUATE

## 2020-09-08 LAB — CULTURE INDICATED

## 2020-09-21 DIAGNOSIS — M542 Cervicalgia: Secondary | ICD-10-CM | POA: Diagnosis not present

## 2020-09-21 DIAGNOSIS — M791 Myalgia, unspecified site: Secondary | ICD-10-CM | POA: Diagnosis not present

## 2020-09-21 DIAGNOSIS — G518 Other disorders of facial nerve: Secondary | ICD-10-CM | POA: Diagnosis not present

## 2020-09-21 DIAGNOSIS — G43719 Chronic migraine without aura, intractable, without status migrainosus: Secondary | ICD-10-CM | POA: Diagnosis not present

## 2020-09-22 ENCOUNTER — Ambulatory Visit (INDEPENDENT_AMBULATORY_CARE_PROVIDER_SITE_OTHER): Payer: Medicare HMO | Admitting: Psychology

## 2020-09-22 DIAGNOSIS — F509 Eating disorder, unspecified: Secondary | ICD-10-CM

## 2020-09-22 DIAGNOSIS — R69 Illness, unspecified: Secondary | ICD-10-CM | POA: Diagnosis not present

## 2020-10-05 DIAGNOSIS — R69 Illness, unspecified: Secondary | ICD-10-CM | POA: Diagnosis not present

## 2020-10-05 DIAGNOSIS — F900 Attention-deficit hyperactivity disorder, predominantly inattentive type: Secondary | ICD-10-CM | POA: Diagnosis not present

## 2020-10-05 DIAGNOSIS — F429 Obsessive-compulsive disorder, unspecified: Secondary | ICD-10-CM | POA: Diagnosis not present

## 2020-10-26 DIAGNOSIS — L03119 Cellulitis of unspecified part of limb: Secondary | ICD-10-CM | POA: Diagnosis not present

## 2020-11-08 ENCOUNTER — Other Ambulatory Visit: Payer: Self-pay

## 2020-11-08 ENCOUNTER — Encounter: Payer: Medicare HMO | Attending: Surgery | Admitting: Skilled Nursing Facility1

## 2020-11-08 DIAGNOSIS — Z6841 Body Mass Index (BMI) 40.0 and over, adult: Secondary | ICD-10-CM | POA: Diagnosis not present

## 2020-11-08 DIAGNOSIS — E669 Obesity, unspecified: Secondary | ICD-10-CM

## 2020-11-08 DIAGNOSIS — K219 Gastro-esophageal reflux disease without esophagitis: Secondary | ICD-10-CM | POA: Diagnosis not present

## 2020-11-08 NOTE — Progress Notes (Signed)
Pre-Operative Nutrition Class:  Appt start time: 5885   End time:  1830.  Patient was seen on 11/08/2020 for Pre-Operative Bariatric Surgery Education at [facility]    Surgery date:  Surgery type: RYGB Start weight at [facility]: 265 Weight today: 261.3  Samples given per MNT protocol. Patient educated on appropriate usage:  Procure Multivitamin Lot [id number] Exp:12/2021  Celebrate  Calcium  Lot # [id number] Exp:03/2022   Protein02 drink Shake Lot #[id number] Exp: 10/23  The following the learning objectives were met by the patient during this course:  Identify Pre-Op Dietary Goals and will begin 2 weeks pre-operatively  Identify appropriate sources of fluids and proteins   State protein recommendations and appropriate sources pre and post-operatively  Identify Post-Operative Dietary Goals and will follow for 2 weeks post-operatively  Identify appropriate multivitamin and calcium sources  Describe the need for physical activity post-operatively and will follow MD recommendations  State when to call healthcare provider regarding medication questions or post-operative complications  Handouts given during class include:  Pre-Op Bariatric Surgery Diet Handout  Protein Shake Handout  Post-Op Bariatric Surgery Nutrition Handout  BEL[facility] Program Information Flyer  Support Group Information Flyer  [facility] Bariatric Supplements Price List  Follow-Up Plan: Patient will follow-up at [facility] 2 weeks post operatively for diet advancement per MD.

## 2020-11-10 DIAGNOSIS — Z20822 Contact with and (suspected) exposure to covid-19: Secondary | ICD-10-CM | POA: Diagnosis not present

## 2020-11-11 DIAGNOSIS — Z20822 Contact with and (suspected) exposure to covid-19: Secondary | ICD-10-CM | POA: Diagnosis not present

## 2020-11-11 DIAGNOSIS — Z03818 Encounter for observation for suspected exposure to other biological agents ruled out: Secondary | ICD-10-CM | POA: Diagnosis not present

## 2020-11-23 ENCOUNTER — Ambulatory Visit: Payer: Self-pay | Admitting: Surgery

## 2020-11-23 NOTE — H&P (View-Only) (Signed)
Surgical Evaluation  Chief Complaint: Morbid obesity, reflux   HPI: Returns for further consultation regarding surgical management of morbid obesity and refractory GERD.  Denies any changes in her health since our initial meeting which was about 7 months ago.  She has completed the preoperative requirements and is ready to proceed with Roux-en-Y gastric bypass which is scheduled for this coming Monday. She has several issues and questions to discuss today.  These were all addressed to her satisfaction. Notably, discussed that she will need to take a proton pump inhibitor for at least 3 months after her gastric bypass surgery in order to protect the gastrojejunal anastomosis.  We may have to alter dosage or formulation in order to accommodate her issues with diarrhea, but this is important. Discussed that this operation is to treat GERD and obesity, but predicting how it will affect the other aspects of her GI symptoms is not possible, and we discussed that there is a risk that it could result in a decreased GI quality of life.  Unclear what effect this will have on her migraines and shortness of breath- hopefully will improve with weight loss  Chest x-ray/upper GI 07/05/2020: Negative, no hiatal hernia Labs 07/20/20- unremarkable (h pylor, t4, Vit D, Vit B12, lipid panel, HgA1C, CMP, CBC) Dietitian Lexine Baton Short 08/19/20)- approved. preop class done 11/08/20 Psychologist Alene Mires [place], 9/23 and 09/22/20)- approved   Initial visit 04/15/20: This is a very pleasant 44 year old woman who is referred by Dr. Bryan Rodriguez for evaluation and surgical treatment of severe obesity complicated by reflux.  She has a long-standing history of reflux and has been followed by Dr. Tarri Rodriguez, who referred her to him for possible TIF.  Given her comorbidity of obesity, TIF contraindicated as is Nissen fundoplication given high risk of recurrence/ failure.  She had an endoscopy in March 2020 demonstrating grade a esophagitis after  which she started pantoprazole.  Unfortunately this aggravated her chronic diarrhea and so she was transitioned to Prilosec and Pepcid.  She had manometry in November 2020 with normal relaxation of the GE junction, fragmented peristalsis, 90% normal propagation.  PH probe was done in November 2020 Dmitry score was 21.1 consistent with increased esophageal acid exposure which was abnormally predominantly in the supine position, negative symptom correlation.  She experiences heartburn, regurgitation, increased throat clearing and belching, breakthrough symptoms with any missed medication doses.  No dysphagia.  Additionally she has a history of IBS D and the patient states that this is a bile acid diarrhea which came of having her gallbladder removed.  She is following with a dietitian at [facility] and it sounds like she is essentially done an elimination diet with significant improvement in her diarrhea symptoms.  She has a history of significant migraines which are triggered by numerous foods, as well as by physical exertion and therefore she really does not do any sort of exercise.  Medications include Xanax, Adderall, clomipramine, Pepcid, Levsin, Lamictal, levocetirizine, Mirena IUD, Robaxin, Macrobid, pramipexole, Phenergan, rifaximin, sertraline, Valtrex, vitamin D, zonisamide  Medical history includes attention deficit disorder, anxiety, bipolar disorder, arthritis, chronic kidney disease, chronic migraines, depression, chronic diarrhea, GERD, lactose intolerance, gluten intolerance, obsessive-compulsive disorder, periodic limb movement disorder, shortness of breath.  She was evaluated by pulmonologist and told that her lung function was excellent.  She did have a CT scan of her chest in October 2019 showing air-trapping indicative of small airways disease  Surgical history includes pertinent positives cholecystectomy, C-section  Family history of hypertension, hyperlipidemia, thyroid disease,  depression and anxiety and obesity, heart disease  Denies alcohol, tobacco or drug use  She is on disability.  She is married and has 2 daughters.  266.6lb/ BMI 45.05  Allergies  Allergen Reactions   Other Itching and Other (See Comments)    Any pain med that is in a tablet form. Causes Itching, vomiting,migraine- is the sodium binding in the tablet form    Codeine Other (See Comments)    migraines   Hydrocodone Other (See Comments)    migraines   Oxycodone Other (See Comments)    migraines   Ultram [Tramadol Hcl] Other (See Comments)    Migraines/nightmares/ STATES ANY SODIUM BINDING TABLET CAUSES ALLERGIC REACTION   Vicodin [Hydrocodone-Acetaminophen] Other (See Comments)    migraines    Past Medical History:  Diagnosis Date   ADD (attention deficit disorder)    Allergy    allergy meds daily    Anxiety    hx bipolar, ADHD   Arthritis    left shoulder    Bruxism    Chronic kidney disease    kindney stones, interstitial cystitis history   Chronic migraine    Depression    states well controlled   Diarrhea    GERD (gastroesophageal reflux disease)    hx gallstones and pancreatitis per pt   Headache(784.0)    HSV infection    on Valtrex suppression   Hypotension    normal rens 80/60   Kidney problem    Lactose intolerance    No pertinent past medical history    states with gall bladder attacks with chest pain, vomiting and diarrhes    EKG  in EPIC   OCD (obsessive compulsive disorder)    Periodic limb movement    Postpartum care and examination of lactating mother 07/13/2011   Sleep apnea    NO SLEEP APNEA BUT STATES HAS PERIODIC LIMB MOVEMENT DISORDER_ WAS IN MIRAPEX PRE PREGNANCY   Sleep disorder 03/03/2014   SOB (shortness of breath)    Swelling     Past Surgical History:  Procedure Laterality Date   40 HOUR [place] STUDY N/A 10/15/2019   Procedure: [place] STUDY;  Surgeon: [name], MD;  Location: [facility];  Service: Gastroenterology;   Laterality: N/A;   CESAREAN SECTION     CESAREAN SECTION  07/11/2011   Procedure: CESAREAN SECTION;  Surgeon: Princess Bruins;  Location: [facility];  Service: Gynecology;  Laterality: N/A;  Repeat   CHOLECYSTECTOMY  11/14/2011   Procedure: LAPAROSCOPIC CHOLECYSTECTOMY WITH INTRAOPERATIVE CHOLANGIOGRAM;  Surgeon: Joyice Faster. Cornett, MD;  Location: [facility];  Service: General;  Laterality: N/A;  Laparoscopic Cholecystectomy with cholangiogram, c-arm   COLONOSCOPY     ESOPHAGEAL MANOMETRY N/A 10/15/2019   Procedure: ESOPHAGEAL MANOMETRY (EM);  Surgeon: [name], MD;  Location: [facility];  Service: Gastroenterology;  Laterality: N/A;   LASIK     2007   TEE WITHOUT CARDIOVERSION N/A 11/18/2013   Procedure: TRANSESOPHAGEAL ECHOCARDIOGRAM (TEE);  Surgeon: Laverda Page, MD;  Location: [facility];  Service: Cardiovascular;  Laterality: N/A;   WISDOM TOOTH EXTRACTION      Family History  Problem Relation Age of Onset   Hypertension Mother    Hyperlipidemia Mother    Thyroid disease Mother    Depression Mother    Anxiety disorder Mother    Obesity Mother    Heart disease Maternal Grandmother    Prostate cancer Maternal Grandfather    Stomach cancer Neg Hx    Rectal cancer Neg Hx  Colon cancer Neg Hx    Colon polyps Neg Hx    Esophageal cancer Neg Hx     Social History   Socioeconomic History   Marital status: Married    Spouse name: Hailea Eaglin   Number of children: 2   Years of education: Not on file   Highest education level: Not on file  Occupational History   Occupation: Disabled  Tobacco Use   Smoking status: Never Smoker   Smokeless tobacco: Never Used   Tobacco comment: smoked a few cigs  in college at a bar but never smoked for a year straight   Vaping Use   Vaping Use: Never used  Substance and Sexual Activity   Alcohol use: Not Currently    Alcohol/week: 1.0 standard drink    Types: 1 Cans of beer per week    Comment: socially -none in 1 1/2 years     Drug use: Not Currently    Types: Other-see comments, Marijuana    Comment: in college   Sexual activity: Yes    Partners: Male    Birth control/protection: I.U.D.  Other Topics Concern   Not on file  Social History Narrative   Not on file   Social Determinants of Health   Financial Resource Strain: Not on file  Food Insecurity: No Food Insecurity   Worried About Running Out of Food in the Last Year: Never true   Ran Out of Food in the Last Year: Never true  Transportation Needs: Not on file  Physical Activity: Not on file  Stress: Not on file  Social Connections: Not on file    Current Outpatient Medications on File Prior to Visit  Medication Sig Dispense Refill   ALPRAZolam (XANAX) 0.25 MG tablet Take 0.25 mg by mouth as needed for anxiety.     amphetamine-dextroamphetamine (ADDERALL XR) 30 MG 24 hr capsule Take 30 mg by mouth daily with breakfast.      amphetamine-dextroamphetamine (ADDERALL) 10 MG tablet Take 10 mg by mouth as needed.     AZO-CRANBERRY PO Take by mouth as needed.     clomiPRAMINE (ANAFRANIL) 75 MG capsule Take 150 mg by mouth at bedtime.  3   famotidine (PEPCID) 40 MG tablet TAKE 1 TABLET BY MOUTH TWICE A DAY 180 tablet 3   hyoscyamine (LEVSIN SL) 0.125 MG SL tablet TAKE 1 TABLET (0.125 MG TOTAL) BY MOUTH EVERY 6 (SIX) HOURS AS NEEDED. *NOT COVERED 120 tablet 1   lamoTRIgine (LAMICTAL) 150 MG tablet Take 150 mg by mouth daily.     levocetirizine (XYZAL) 5 MG tablet Take 5 mg by mouth at bedtime.     levonorgestrel (MIRENA) 20 MCG/24HR IUD 1 each by Intrauterine route once.     methocarbamol (ROBAXIN) 500 MG tablet TAKE 1 TABLET TWICE A DAY AS NEEDED, LIMIT 1-2 TREATMENTS PER WEEK  0   OVER THE COUNTER MEDICATION Trigger point injections in the scalp of  decadron , Lidocaine, marcaine -for chronic migraines as needed  Gets very 2 months     pramipexole (MIRAPEX) 1 MG tablet Take 1 mg by mouth at bedtime.     promethazine (PHENERGAN) 25 MG tablet Take 25 mg by  mouth every 6 (six) hours as needed for nausea or vomiting (for migraine headaches).      rifaximin (XIFAXAN) 550 MG TABS tablet Take 1 tablet (550 mg total) by mouth 3 (three) times daily. 42 tablet 0   sertraline (ZOLOFT) 100 MG tablet Take 100 mg by mouth every  morning.      valACYclovir (VALTREX) 1000 MG tablet Take 1,000 mg by mouth as needed.     Vitamin D, Ergocalciferol, (DRISDOL) 1.25 MG (50000 UT) CAPS capsule Take 1 capsule (50,000 Units total) by mouth every 7 (seven) days. 4 capsule 0   zonisamide (ZONEGRAN) 100 MG capsule Take 2 capsules by mouth daily.   1   No current facility-administered medications on file prior to visit.    Review of Systems: a complete, 10pt review of systems was completed with pertinent positives and negatives as documented in the HPI  Physical Exam: Vitals  Weight: 259 lb   Height: 64.5 in  Body Surface Area: 2.2 m   Body Mass Index: 43.77 kg/m   Temp.: 97.6 F    Pulse: 103 (Regular)    BP: 124/88(Sitting, Left Arm, Standard)   Alert, anxious appearing unlabored respirations    CBC Latest Ref Rng & Units 01/01/2019 03/25/2015 12/28/2014  WBC 3.4 - 10.8 x10E3/uL 8.9 8.1 9.2  Hemoglobin 11.1 - 15.9 g/dL 12.9 12.2 12.7  Hematocrit 34.0 - 46.6 % 40.6 38.3 39.3  Platelets 150 - 400 K/uL - - -    CMP Latest Ref Rng & Units 01/01/2019 03/25/2015 11/15/2011  Glucose 65 - 99 mg/dL 83 79 139(H)  BUN 6 - 24 mg/dL 10 10 8   Creatinine 0.57 - 1.00 mg/dL 0.86 0.73 0.71  Sodium 134 - 144 mmol/L 138 137 138  Potassium 3.5 - 5.2 mmol/L 4.2 4.9 4.1  Chloride 96 - 106 mmol/L 99 103 103  CO2 20 - 29 mmol/L 23 27 26   Calcium 8.7 - 10.2 mg/dL 9.3 9.3 9.4  Total Protein 6.0 - 8.5 g/dL 7.2 6.8 6.8  Total Bilirubin 0.0 - 1.2 mg/dL 0.3 0.3 0.3  Alkaline Phos 39 - 117 IU/L 103 77 75  AST 0 - 40 IU/L 25 19 26   ALT 0 - 32 IU/L 25 21 26     No results found for: INR, PROTIME  Imaging: No results found.   A/P:  GERD (GASTROESOPHAGEAL REFLUX DISEASE)  (K21.9) Story: She has not been terribly successful with lifestyle changes and weight loss, and is not a candidate for TIF or laparoscopic Nissen given habitus. She does not tolerate PPI therapy due to other GI issues.   MORBID OBESITY (E66.01) Story: We have previously discussed the surgery and the risks of bleeding, infection, pain, scarring, injury to intra-abdominal structures, leak, ulcers, internal hernias, exacerbation of irritable bowel syndrome and aggravation of chronic abdominal pain/chronic GI issues as discussed in HPI. Discussed risk of malabsorption and malnutrition and the necessity of lifelong vitamin supplementation, avoiding NSAIDs, and absolutely no tobacco use ever. Questions were answered. She is ready and wishes to proceed. Will proceed with laparoscopic roux en y gastric bypass.   Patient Active Problem List   Diagnosis Date Noted   Heartburn    Regurgitation and rechewing    Gastroesophageal reflux disease    Obesity 09/20/2018   DOE (dyspnea on exertion) 09/19/2018   Migraine 12/28/2014   Family history of Hashimoto thyroiditis 04/27/2014   Sleep disorder 03/03/2014       Romana Juniper, MD [facility]  See AMION to contact appropriate on-call provider

## 2020-11-23 NOTE — H&P (Signed)
Surgical Evaluation  Chief Complaint: Morbid obesity, reflux   HPI: Returns for further consultation regarding surgical management of morbid obesity and refractory GERD.  Denies any changes in her health since our initial meeting which was about 7 months ago.  She has completed the preoperative requirements and is ready to proceed with Roux-en-Y gastric bypass which is scheduled for this coming Monday. She has several issues and questions to discuss today.  These were all addressed to her satisfaction. Notably, discussed that she will need to take a proton pump inhibitor for at least 3 months after her gastric bypass surgery in order to protect the gastrojejunal anastomosis.  We may have to alter dosage or formulation in order to accommodate her issues with diarrhea, but this is important. Discussed that this operation is to treat GERD and obesity, but predicting how it will affect the other aspects of her GI symptoms is not possible, and we discussed that there is a risk that it could result in a decreased GI quality of life.  Unclear what effect this will have on her migraines and shortness of breath- hopefully will improve with weight loss  Chest x-ray/upper GI 07/05/2020: Negative, no hiatal hernia Labs 07/20/20- unremarkable (h pylor, t4, Vit D, Vit B12, lipid panel, HgA1C, CMP, CBC) Dietitian Lexine Baton Short 08/19/20)- approved. preop class done 11/08/20 Psychologist Alene Mires [place], 9/23 and 09/22/20)- approved   Initial visit 04/15/20: This is a very pleasant 44 year old woman who is referred by Dr. Bryan Lemma for evaluation and surgical treatment of severe obesity complicated by reflux.  She has a long-standing history of reflux and has been followed by [name], who referred her to him for possible TIF.  Given her comorbidity of obesity, TIF contraindicated as is Nissen fundoplication given high risk of recurrence/ failure.  She had an endoscopy in March 2020 demonstrating grade a esophagitis after  which she started pantoprazole.  Unfortunately this aggravated her chronic diarrhea and so she was transitioned to Prilosec and Pepcid.  She had manometry in November 2020 with normal relaxation of the GE junction, fragmented peristalsis, 90% normal propagation.  PH probe was done in November 2020 Dmitry score was 21.1 consistent with increased esophageal acid exposure which was abnormally predominantly in the supine position, negative symptom correlation.  She experiences heartburn, regurgitation, increased throat clearing and belching, breakthrough symptoms with any missed medication doses.  No dysphagia.  Additionally she has a history of IBS D and the patient states that this is a bile acid diarrhea which came of having her gallbladder removed.  She is following with a dietitian at [facility] and it sounds like she is essentially done an elimination diet with significant improvement in her diarrhea symptoms.  She has a history of significant migraines which are triggered by numerous foods, as well as by physical exertion and therefore she really does not do any sort of exercise.  Medications include Xanax, Adderall, clomipramine, Pepcid, Levsin, Lamictal, levocetirizine, Mirena IUD, Robaxin, Macrobid, pramipexole, Phenergan, rifaximin, sertraline, Valtrex, vitamin D, zonisamide  Medical history includes attention deficit disorder, anxiety, bipolar disorder, arthritis, chronic kidney disease, chronic migraines, depression, chronic diarrhea, GERD, lactose intolerance, gluten intolerance, obsessive-compulsive disorder, periodic limb movement disorder, shortness of breath.  She was evaluated by pulmonologist and told that her lung function was excellent.  She did have a CT scan of her chest in October 2019 showing air-trapping indicative of small airways disease  Surgical history includes pertinent positives cholecystectomy, C-section  Family history of hypertension, hyperlipidemia, thyroid disease,  depression and anxiety and obesity, heart disease  Denies alcohol, tobacco or drug use  She is on disability.  She is married and has 2 daughters.  266.6lb/ BMI 45.05  Allergies  Allergen Reactions   Other Itching and Other (See Comments)    Any pain med that is in a tablet form. Causes Itching, vomiting,migraine- is the sodium binding in the tablet form    Codeine Other (See Comments)    migraines   Hydrocodone Other (See Comments)    migraines   Oxycodone Other (See Comments)    migraines   Ultram [Tramadol Hcl] Other (See Comments)    Migraines/nightmares/ STATES ANY SODIUM BINDING TABLET CAUSES ALLERGIC REACTION   Vicodin [Hydrocodone-Acetaminophen] Other (See Comments)    migraines    Past Medical History:  Diagnosis Date   ADD (attention deficit disorder)    Allergy    allergy meds daily    Anxiety    hx bipolar, ADHD   Arthritis    left shoulder    Bruxism    Chronic kidney disease    kindney stones, interstitial cystitis history   Chronic migraine    Depression    states well controlled   Diarrhea    GERD (gastroesophageal reflux disease)    hx gallstones and pancreatitis per pt   Headache(784.0)    HSV infection    on Valtrex suppression   Hypotension    normal rens 80/60   Kidney problem    Lactose intolerance    No pertinent past medical history    states with gall bladder attacks with chest pain, vomiting and diarrhes    EKG  in [facility]IC   OCD (obsessive compulsive disorder)    Periodic limb movement    Postpartum care and examination of lactating mother 07/13/2011   Sleep apnea    NO SLEEP APNEA BUT STATES HAS PERIODIC LIMB MOVEMENT DISORDER_ WAS IN MIRAPEX PRE PREGNANCY   Sleep disorder 03/03/2014   SOB (shortness of breath)    Swelling     Past Surgical History:  Procedure Laterality Date   78 HOUR [place] STUDY N/A 10/15/2019   Procedure: [place] STUDY;  Surgeon: [name], MD;  Location: [facility];  Service: Gastroenterology;   Laterality: N/A;   CESAREAN SECTION     CESAREAN SECTION  07/11/2011   Procedure: CESAREAN SECTION;  Surgeon: Princess Bruins;  Location: [facility];  Service: Gynecology;  Laterality: N/A;  Repeat   CHOLECYSTECTOMY  11/14/2011   Procedure: LAPAROSCOPIC CHOLECYSTECTOMY WITH INTRAOPERATIVE CHOLANGIOGRAM;  Surgeon: Joyice Faster. Cornett, MD;  Location: [facility];  Service: General;  Laterality: N/A;  Laparoscopic Cholecystectomy with cholangiogram, c-arm   COLONOSCOPY     ESOPHAGEAL MANOMETRY N/A 10/15/2019   Procedure: ESOPHAGEAL MANOMETRY (EM);  Surgeon: [name], MD;  Location: [facility];  Service: Gastroenterology;  Laterality: N/A;   LASIK     2007   TEE WITHOUT CARDIOVERSION N/A 11/18/2013   Procedure: TRANSESOPHAGEAL ECHOCARDIOGRAM (TEE);  Surgeon: Laverda Page, MD;  Location: [facility];  Service: Cardiovascular;  Laterality: N/A;   WISDOM TOOTH EXTRACTION      Family History  Problem Relation Age of Onset   Hypertension Mother    Hyperlipidemia Mother    Thyroid disease Mother    Depression Mother    Anxiety disorder Mother    Obesity Mother    Heart disease Maternal Grandmother    Prostate cancer Maternal Grandfather    Stomach cancer Neg Hx    Rectal cancer Neg Hx  Colon cancer Neg Hx    Colon polyps Neg Hx    Esophageal cancer Neg Hx     Social History   Socioeconomic History   Marital status: Married    Spouse name: Khaya Theissen   Number of children: 2   Years of education: Not on file   Highest education level: Not on file  Occupational History   Occupation: Disabled  Tobacco Use   Smoking status: Never Smoker   Smokeless tobacco: Never Used   Tobacco comment: smoked a few cigs  in college at a bar but never smoked for a year straight   Vaping Use   Vaping Use: Never used  Substance and Sexual Activity   Alcohol use: Not Currently    Alcohol/week: 1.0 standard drink    Types: 1 Cans of beer per week    Comment: socially -none in 1 1/2 years     Drug use: Not Currently    Types: Other-see comments, Marijuana    Comment: in college   Sexual activity: Yes    Partners: Male    Birth control/protection: I.U.D.  Other Topics Concern   Not on file  Social History Narrative   Not on file   Social Determinants of Health   Financial Resource Strain: Not on file  Food Insecurity: No Food Insecurity   Worried About Running Out of Food in the Last Year: Never true   Ran Out of Food in the Last Year: Never true  Transportation Needs: Not on file  Physical Activity: Not on file  Stress: Not on file  Social Connections: Not on file    Current Outpatient Medications on File Prior to Visit  Medication Sig Dispense Refill   ALPRAZolam (XANAX) 0.25 MG tablet Take 0.25 mg by mouth as needed for anxiety.     amphetamine-dextroamphetamine (ADDERALL XR) 30 MG 24 hr capsule Take 30 mg by mouth daily with breakfast.      amphetamine-dextroamphetamine (ADDERALL) 10 MG tablet Take 10 mg by mouth as needed.     AZO-CRANBERRY PO Take by mouth as needed.     clomiPRAMINE (ANAFRANIL) 75 MG capsule Take 150 mg by mouth at bedtime.  3   famotidine (PEPCID) 40 MG tablet TAKE 1 TABLET BY MOUTH TWICE A DAY 180 tablet 3   hyoscyamine (LEVSIN SL) 0.125 MG SL tablet TAKE 1 TABLET (0.125 MG TOTAL) BY MOUTH EVERY 6 (SIX) HOURS AS NEEDED. *NOT COVERED 120 tablet 1   lamoTRIgine (LAMICTAL) 150 MG tablet Take 150 mg by mouth daily.     levocetirizine (XYZAL) 5 MG tablet Take 5 mg by mouth at bedtime.     levonorgestrel (MIRENA) 20 MCG/24HR IUD 1 each by Intrauterine route once.     methocarbamol (ROBAXIN) 500 MG tablet TAKE 1 TABLET TWICE A DAY AS NEEDED, LIMIT 1-2 TREATMENTS PER WEEK  0   OVER THE COUNTER MEDICATION Trigger point injections in the scalp of  decadron , Lidocaine, marcaine -for chronic migraines as needed  Gets very 2 months     pramipexole (MIRAPEX) 1 MG tablet Take 1 mg by mouth at bedtime.     promethazine (PHENERGAN) 25 MG tablet Take 25 mg by  mouth every 6 (six) hours as needed for nausea or vomiting (for migraine headaches).      rifaximin (XIFAXAN) 550 MG TABS tablet Take 1 tablet (550 mg total) by mouth 3 (three) times daily. 42 tablet 0   sertraline (ZOLOFT) 100 MG tablet Take 100 mg by mouth every  morning.      valACYclovir (VALTREX) 1000 MG tablet Take 1,000 mg by mouth as needed.     Vitamin D, Ergocalciferol, (DRISDOL) 1.25 MG (50000 UT) CAPS capsule Take 1 capsule (50,000 Units total) by mouth every 7 (seven) days. 4 capsule 0   zonisamide (ZONEGRAN) 100 MG capsule Take 2 capsules by mouth daily.   1   No current facility-administered medications on file prior to visit.    Review of Systems: a complete, 10pt review of systems was completed with pertinent positives and negatives as documented in the HPI  Physical Exam: Vitals  Weight: 259 lb   Height: 64.5 in  Body Surface Area: 2.2 m   Body Mass Index: 43.77 kg/m   Temp.: 97.6 F    Pulse: 103 (Regular)    BP: 124/88(Sitting, Left Arm, Standard)   Alert, anxious appearing unlabored respirations    CBC Latest Ref Rng & Units 01/01/2019 03/25/2015 12/28/2014  WBC 3.4 - 10.8 x10E3/uL 8.9 8.1 9.2  Hemoglobin 11.1 - 15.9 g/dL 12.9 12.2 12.7  Hematocrit 34.0 - 46.6 % 40.6 38.3 39.3  Platelets 150 - 400 K/uL - - -    CMP Latest Ref Rng & Units 01/01/2019 03/25/2015 11/15/2011  Glucose 65 - 99 mg/dL 83 79 139(H)  BUN 6 - 24 mg/dL 10 10 8   Creatinine 0.57 - 1.00 mg/dL 0.86 0.73 0.71  Sodium 134 - 144 mmol/L 138 137 138  Potassium 3.5 - 5.2 mmol/L 4.2 4.9 4.1  Chloride 96 - 106 mmol/L 99 103 103  CO2 20 - 29 mmol/L 23 27 26   Calcium 8.7 - 10.2 mg/dL 9.3 9.3 9.4  Total Protein 6.0 - 8.5 g/dL 7.2 6.8 6.8  Total Bilirubin 0.0 - 1.2 mg/dL 0.3 0.3 0.3  Alkaline Phos 39 - 117 IU/L 103 77 75  AST 0 - 40 IU/L 25 19 26   ALT 0 - 32 IU/L 25 21 26     No results found for: INR, PROTIME  Imaging: No results found.   A/P:  GERD (GASTROESOPHAGEAL REFLUX DISEASE)  (K21.9) Story: She has not been terribly successful with lifestyle changes and weight loss, and is not a candidate for TIF or laparoscopic Nissen given habitus. She does not tolerate PPI therapy due to other GI issues.   MORBID OBESITY (E66.01) Story: We have previously discussed the surgery and the risks of bleeding, infection, pain, scarring, injury to intra-abdominal structures, leak, ulcers, internal hernias, exacerbation of irritable bowel syndrome and aggravation of chronic abdominal pain/chronic GI issues as discussed in HPI. Discussed risk of malabsorption and malnutrition and the necessity of lifelong vitamin supplementation, avoiding NSAIDs, and absolutely no tobacco use ever. Questions were answered. She is ready and wishes to proceed. Will proceed with laparoscopic roux en y gastric bypass.   Patient Active Problem List   Diagnosis Date Noted   Heartburn    Regurgitation and rechewing    Gastroesophageal reflux disease    Obesity 09/20/2018   DOE (dyspnea on exertion) 09/19/2018   Migraine 12/28/2014   Family history of Hashimoto thyroiditis 04/27/2014   Sleep disorder 03/03/2014       Romana Juniper, MD [facility]  See AMION to contact appropriate on-call provider

## 2020-11-25 ENCOUNTER — Other Ambulatory Visit (HOSPITAL_COMMUNITY)
Admission: RE | Admit: 2020-11-25 | Discharge: 2020-11-25 | Disposition: A | Discharge status: Home / Self Care | Payer: Medicare HMO | Source: Ambulatory Visit | Attending: Surgery | Admitting: Surgery

## 2020-11-25 DIAGNOSIS — Z20822 Contact with and (suspected) exposure to covid-19: Secondary | ICD-10-CM | POA: Diagnosis not present

## 2020-11-25 DIAGNOSIS — Z01812 Encounter for preprocedural laboratory examination: Secondary | ICD-10-CM | POA: Diagnosis not present

## 2020-11-25 DIAGNOSIS — K219 Gastro-esophageal reflux disease without esophagitis: Secondary | ICD-10-CM | POA: Diagnosis not present

## 2020-11-25 LAB — SARS CORONAVIRUS 2 (TAT 6-24 HRS): SARS Coronavirus 2: NEGATIVE

## 2020-11-25 NOTE — Patient Instructions (Addendum)
DUE TO COVID-19 ONLY ONE VISITOR IS ALLOWED TO COME WITH YOU AND STAY IN THE WAITING ROOM ONLY DURING PRE OP AND PROCEDURE DAY OF SURGERY. THE 1 VISITOR  MAY VISIT WITH YOU AFTER SURGERY IN YOUR PRIVATE ROOM DURING VISITING HOURS ONLY!  YOU NEED TO HAVE A COVID 19 TEST ON: 11/25/20, THIS TEST MUST BE DONE BEFORE SURGERY,  COVID TESTING SITE [address] [place] [address], IT IS ON THE RIGHT GOING OUT [address] APPROXIMATELY  2 MINUTES PAST [employer] ON THE RIGHT. ONCE YOUR COVID TEST IS COMPLETED,  PLEASE BEGIN THE QUARANTINE INSTRUCTIONS AS OUTLINED IN YOUR HANDOUT.                Gloria Rodriguez    Your procedure is scheduled on: 11/29/20   Report to [facility] Main  Entrance   Report to short stay at: 5:30 AM     Call this number if you have problems the morning of surgery [phone]    Remember:   MORNING OF SURGERY DRINK:   DRINK 1 G2 drink BEFORE YOU LEAVE HOME, DRINK ALL OF THE  G2 DRINK AT ONE TIME.   NO SOLID FOOD AFTER 600 PM THE NIGHT BEFORE YOUR SURGERY. YOU MAY DRINK CLEAR FLUIDS. THE G2 DRINK YOU DRINK BEFORE YOU LEAVE HOME(AT 4:15 AM)  WILL BE THE LAST FLUIDS YOU DRINK BEFORE SURGERY.  PAIN IS EXPECTED AFTER SURGERY AND WILL NOT BE COMPLETELY ELIMINATED. AMBULATION AND TYLENOL WILL HELP REDUCE INCISIONAL AND GAS PAIN. MOVEMENT IS KEY!  YOU ARE EXPECTED TO BE OUT OF BED WITHIN 4 HOURS OF ADMISSION TO YOUR PATIENT ROOM.  SITTING IN THE RECLINER THROUGHOUT THE DAY IS IMPORTANT FOR DRINKING FLUIDS AND MOVING GAS THROUGHOUT THE GI TRACT.  COMPRESSION STOCKINGS SHOULD BE WORN [place] UNLESS YOU ARE WALKING.   INCENTIVE SPIROMETER SHOULD BE USED EVERY HOUR WHILE AWAKE TO DECREASE POST-OPERATIVE COMPLICATIONS SUCH AS PNEUMONIA.  WHEN DISCHARGED HOME, IT IS IMPORTANT TO CONTINUE TO WALK EVERY HOUR AND USE THE INCENTIVE SPIROMETER EVERY HOUR.   CLEAR LIQUID DIET   Foods Allowed                                                                      Foods Excluded  Coffee and tea, regular and decaf                             liquids that you cannot  Plain Jell-O any favor except red or purple                                           see through such as: Fruit ices (not with fruit pulp)                                     milk, soups, orange juice  Iced Popsicles  All solid food Carbonated beverages, regular and diet                                    Cranberry, grape and apple juices Sports drinks like Gatorade Lightly seasoned clear broth or consume(fat free) Sugar, honey syrup  Sample Menu Breakfast                                Lunch                                     Supper Cranberry juice                    Beef broth                            Chicken broth Jell-O                                     Grape juice                           Apple juice Coffee or tea                        Jell-O                                      Popsicle                                                Coffee or tea                        Coffee or tea  _____________________________________________________________________   BRUSH YOUR TEETH MORNING OF SURGERY AND RINSE YOUR MOUTH OUT, NO CHEWING GUM CANDY OR MINTS.    Take these medicines the morning of surgery with A SIP OF WATER: famotidine,sertraline.Alprazolan as needed.                               You may not have any metal on your body including hair pins and              piercings  Do not wear jewelry, make-up, lotions, powders or perfumes, deodorant             Do not wear nail polish on your fingernails.  Do not shave  48 hours prior to surgery.    Do not bring valuables to the hospital. [place].  Contacts, dentures or bridgework may not be worn into surgery.  Leave suitcase in the car. After surgery it may be brought to your room.     Patients discharged the day  of surgery  will not be allowed to drive home. IF YOU ARE HAVING SURGERY AND GOING HOME THE SAME DAY, YOU MUST HAVE AN ADULT TO DRIVE YOU HOME AND BE WITH YOU FOR 24 HOURS. YOU MAY GO HOME BY TAXI OR UBER OR ORTHERWISE, BUT AN ADULT MUST ACCOMPANY YOU HOME AND STAY WITH YOU FOR 24 HOURS.  Name and phone number of your driver:  Special Instructions: N/A              Please read over the following fact sheets you were given: _____________________________________________________________________         [facility] - Preparing for Surgery Before surgery, you can play an important role.  Because skin is not sterile, your skin needs to be as free of germs as possible.  You can reduce the number of germs on your skin by washing with CHG (chlorahexidine gluconate) soap before surgery.  CHG is an antiseptic cleaner which kills germs and bonds with the skin to continue killing germs even after washing. Please DO NOT use if you have an allergy to CHG or antibacterial soaps.  If your skin becomes reddened/irritated stop using the CHG and inform your nurse when you arrive at Short Stay. Do not shave (including legs and underarms) for at least 48 hours prior to the first CHG shower.  You may shave your face/neck. Please follow these instructions carefully:  1.  Shower with CHG Soap the night before surgery and the  morning of Surgery.  2.  If you choose to wash your hair, wash your hair first as usual with your  normal  shampoo.  3.  After you shampoo, rinse your hair and body thoroughly to remove the  shampoo.                           4.  Use CHG as you would any other liquid soap.  You can apply chg directly  to the skin and wash                       Gently with a scrungie or clean washcloth.  5.  Apply the CHG Soap to your body ONLY FROM THE NECK DOWN.   Do not use on face/ open                           Wound or open sores. Avoid contact with eyes, ears mouth and genitals (private parts).                        Wash face,  Genitals (private parts) with your normal soap.             6.  Wash thoroughly, paying special attention to the area where your surgery  will be performed.  7.  Thoroughly rinse your body with warm water from the neck down.  8.  DO NOT shower/wash with your normal soap after using and rinsing off  the CHG Soap.                9.  Pat yourself dry with a clean towel.            10.  Wear clean pajamas.            11.  Place clean sheets on your bed the night of your first shower and do not  sleep with  pets. Day of Surgery : Do not apply any lotions/deodorants the morning of surgery.  Please wear clean clothes to the hospital/surgery center.  FAILURE TO FOLLOW THESE INSTRUCTIONS MAY RESULT IN THE CANCELLATION OF YOUR SURGERY PATIENT SIGNATURE_________________________________  NURSE SIGNATURE__________________________________  ________________________________________________________________________

## 2020-11-26 ENCOUNTER — Encounter (HOSPITAL_COMMUNITY): Payer: Self-pay

## 2020-11-26 ENCOUNTER — Other Ambulatory Visit: Payer: Self-pay

## 2020-11-26 ENCOUNTER — Encounter (HOSPITAL_COMMUNITY)
Admission: RE | Admit: 2020-11-26 | Discharge: 2020-11-26 | Disposition: A | Discharge status: Home / Self Care | Payer: Medicare HMO | Source: Ambulatory Visit | Attending: Surgery | Admitting: Surgery

## 2020-11-26 DIAGNOSIS — Z01812 Encounter for preprocedural laboratory examination: Secondary | ICD-10-CM | POA: Insufficient documentation

## 2020-11-26 HISTORY — DX: Personal history of urinary calculi: Z87.442

## 2020-11-26 HISTORY — DX: Dyspnea, unspecified: R06.00

## 2020-11-26 LAB — CBC WITH DIFFERENTIAL/PLATELET
Abs Immature Granulocytes: 0.02 10*3/uL (ref 0.00–0.07)
Basophils Absolute: 0.1 10*3/uL (ref 0.0–0.1)
Basophils Relative: 1 %
Eosinophils Absolute: 0 10*3/uL (ref 0.0–0.5)
Eosinophils Relative: 0 %
HCT: 41.1 % (ref 36.0–46.0)
Hemoglobin: 13 g/dL (ref 12.0–15.0)
Immature Granulocytes: 0 %
Lymphocytes Relative: 24 %
Lymphs Abs: 2.1 10*3/uL (ref 0.7–4.0)
MCH: 27.3 pg (ref 26.0–34.0)
MCHC: 31.6 g/dL (ref 30.0–36.0)
MCV: 86.2 fL (ref 80.0–100.0)
Monocytes Absolute: 0.8 10*3/uL (ref 0.1–1.0)
Monocytes Relative: 8 %
Neutro Abs: 6.1 10*3/uL (ref 1.7–7.7)
Neutrophils Relative %: 67 %
Platelets: 248 10*3/uL (ref 150–400)
RBC: 4.77 MIL/uL (ref 3.87–5.11)
RDW: 16.6 % — ABNORMAL HIGH (ref 11.5–15.5)
WBC: 9.1 10*3/uL (ref 4.0–10.5)
nRBC: 0 % (ref 0.0–0.2)

## 2020-11-26 LAB — COMPREHENSIVE METABOLIC PANEL
ALT: 20 U/L (ref 0–44)
AST: 16 U/L (ref 15–41)
Albumin: 3.7 g/dL (ref 3.5–5.0)
Alkaline Phosphatase: 78 U/L (ref 38–126)
Anion gap: 7 (ref 5–15)
BUN: 23 mg/dL — ABNORMAL HIGH (ref 6–20)
CO2: 26 mmol/L (ref 22–32)
Calcium: 9.5 mg/dL (ref 8.9–10.3)
Chloride: 103 mmol/L (ref 98–111)
Creatinine, Ser: 0.79 mg/dL (ref 0.44–1.00)
GFR, Estimated: >60 mL/min (ref >60)
Glucose, Bld: 92 mg/dL (ref 70–99)
Potassium: 4.4 mmol/L (ref 3.5–5.1)
Sodium: 136 mmol/L (ref 135–145)
Total Bilirubin: 0.4 mg/dL (ref 0.3–1.2)
Total Protein: 7.7 g/dL (ref 6.5–8.1)

## 2020-11-26 NOTE — Progress Notes (Signed)
COVID Vaccine Completed: Yes Date COVID Vaccine completed: 08/2020 COVID vaccine manufacturer: [employer]      PCP - Dr. Arna Medici Cardiologist -   Chest x-ray - 07/05/20 EKG - 07/08/20 Stress Test -  ECHO -  Cardiac Cath -  Pacemaker/ICD device last checked:  Sleep Study -  CPAP -   Fasting Blood Sugar -  Checks Blood Sugar _____ times a day  Blood Thinner Instructions: Aspirin Instructions: Last Dose:  Anesthesia review:   Patient denies shortness of breath, fever, cough and chest pain at PAT appointment   Patient verbalized understanding of instructions that were given to them at the PAT appointment. Patient was also instructed that they will need to review over the PAT instructions again at home before surgery.

## 2020-11-28 MED ORDER — BUPIVACAINE LIPOSOME 1.3 % IJ SUSP
20.0000 mL | Freq: Once | INTRAMUSCULAR | Status: DC
  Filled 2020-11-28: qty 20

## 2020-11-29 ENCOUNTER — Other Ambulatory Visit: Payer: Self-pay

## 2020-11-29 ENCOUNTER — Inpatient Hospital Stay (HOSPITAL_COMMUNITY)
Admission: RE | Admit: 2020-11-29 | Discharge: 2020-11-30 | DRG: 621 | Disposition: A | Discharge status: Home / Self Care | Payer: Medicare HMO | Attending: Surgery | Admitting: Surgery

## 2020-11-29 ENCOUNTER — Encounter (HOSPITAL_COMMUNITY)
Admission: RE | Disposition: A | Discharge status: Home / Self Care | Payer: Self-pay | Source: Home / Self Care | Attending: Surgery

## 2020-11-29 ENCOUNTER — Encounter (HOSPITAL_COMMUNITY): Payer: Self-pay | Admitting: Surgery

## 2020-11-29 ENCOUNTER — Inpatient Hospital Stay (HOSPITAL_COMMUNITY): Payer: Medicare HMO | Admitting: Certified Registered"

## 2020-11-29 DIAGNOSIS — Z79899 Other long term (current) drug therapy: Secondary | ICD-10-CM

## 2020-11-29 DIAGNOSIS — Z83438 Family history of other disorder of lipoprotein metabolism and other lipidemia: Secondary | ICD-10-CM

## 2020-11-29 DIAGNOSIS — Z6841 Body Mass Index (BMI) 40.0 and over, adult: Secondary | ICD-10-CM

## 2020-11-29 DIAGNOSIS — Z885 Allergy status to narcotic agent status: Secondary | ICD-10-CM

## 2020-11-29 DIAGNOSIS — K589 Irritable bowel syndrome without diarrhea: Secondary | ICD-10-CM | POA: Diagnosis present

## 2020-11-29 DIAGNOSIS — K219 Gastro-esophageal reflux disease without esophagitis: Secondary | ICD-10-CM | POA: Diagnosis not present

## 2020-11-29 DIAGNOSIS — K432 Incisional hernia without obstruction or gangrene: Secondary | ICD-10-CM | POA: Diagnosis present

## 2020-11-29 DIAGNOSIS — Z8042 Family history of malignant neoplasm of prostate: Secondary | ICD-10-CM

## 2020-11-29 DIAGNOSIS — F419 Anxiety disorder, unspecified: Secondary | ICD-10-CM | POA: Diagnosis present

## 2020-11-29 DIAGNOSIS — G4733 Obstructive sleep apnea (adult) (pediatric): Secondary | ICD-10-CM | POA: Diagnosis not present

## 2020-11-29 DIAGNOSIS — Z8349 Family history of other endocrine, nutritional and metabolic diseases: Secondary | ICD-10-CM

## 2020-11-29 DIAGNOSIS — F429 Obsessive-compulsive disorder, unspecified: Secondary | ICD-10-CM | POA: Diagnosis present

## 2020-11-29 DIAGNOSIS — Z8249 Family history of ischemic heart disease and other diseases of the circulatory system: Secondary | ICD-10-CM | POA: Diagnosis not present

## 2020-11-29 DIAGNOSIS — K2100 Gastro-esophageal reflux disease with esophagitis, without bleeding: Secondary | ICD-10-CM | POA: Diagnosis not present

## 2020-11-29 DIAGNOSIS — Z9049 Acquired absence of other specified parts of digestive tract: Secondary | ICD-10-CM

## 2020-11-29 DIAGNOSIS — R69 Illness, unspecified: Secondary | ICD-10-CM | POA: Diagnosis not present

## 2020-11-29 HISTORY — PX: UPPER GI ENDOSCOPY: SHX6162

## 2020-11-29 HISTORY — PX: GASTRIC ROUX-EN-Y: SHX5262

## 2020-11-29 LAB — PREGNANCY, URINE: Preg Test, Ur: NEGATIVE

## 2020-11-29 LAB — TYPE AND SCREEN
ABO/RH(D): O POS
Antibody Screen: NEGATIVE

## 2020-11-29 SURGERY — LAPAROSCOPIC ROUX-EN-Y GASTRIC BYPASS WITH UPPER ENDOSCOPY
Anesthesia: General | Site: Abdomen

## 2020-11-29 MED ORDER — SODIUM CHLORIDE 0.9 % IV SOLN: INTRAVENOUS | Status: DC

## 2020-11-29 MED ORDER — LIDOCAINE HCL 2 % IJ SOLN
INTRAMUSCULAR | Status: AC
  Filled 2020-11-29: qty 20

## 2020-11-29 MED ORDER — FENTANYL CITRATE (PF) 100 MCG/2ML IJ SOLN: 25.0000 ug | INTRAMUSCULAR | Status: DC | PRN

## 2020-11-29 MED ORDER — PHENYLEPHRINE HCL-NACL 10-0.9 MG/250ML-% IV SOLN
INTRAVENOUS | Status: AC
  Filled 2020-11-29: qty 250

## 2020-11-29 MED ORDER — METOPROLOL TARTRATE 5 MG/5ML IV SOLN: 5.0000 mg | Freq: Four times a day (QID) | INTRAVENOUS | Status: DC | PRN

## 2020-11-29 MED ORDER — ALPRAZOLAM 0.25 MG PO TABS: 0.2500 mg | ORAL_TABLET | Freq: Two times a day (BID) | ORAL | Status: DC | PRN

## 2020-11-29 MED ORDER — APREPITANT 40 MG PO CAPS
40.0000 mg | ORAL_CAPSULE | ORAL | Status: AC
  Administered 2020-11-29: 06:00:00 40 mg via ORAL
  Filled 2020-11-29: qty 1

## 2020-11-29 MED ORDER — GABAPENTIN 100 MG PO CAPS
200.0000 mg | ORAL_CAPSULE | Freq: Two times a day (BID) | ORAL | Status: DC
  Administered 2020-11-29 – 2020-11-30 (×2): 200 mg via ORAL
  Filled 2020-11-29 (×2): qty 2

## 2020-11-29 MED ORDER — FENTANYL CITRATE (PF) 100 MCG/2ML IJ SOLN
INTRAMUSCULAR | Status: AC
  Filled 2020-11-29: qty 2

## 2020-11-29 MED ORDER — ROCURONIUM BROMIDE 10 MG/ML (PF) SYRINGE
PREFILLED_SYRINGE | INTRAVENOUS | Status: AC
  Filled 2020-11-29: qty 10

## 2020-11-29 MED ORDER — PROPOFOL 10 MG/ML IV BOLUS
INTRAVENOUS | Status: AC
  Filled 2020-11-29: qty 40

## 2020-11-29 MED ORDER — FENTANYL CITRATE (PF) 250 MCG/5ML IJ SOLN
INTRAMUSCULAR | Status: DC | PRN
  Administered 2020-11-29: 100 ug via INTRAVENOUS
  Administered 2020-11-29 (×2): 50 ug via INTRAVENOUS

## 2020-11-29 MED ORDER — LACTASE 3000 UNITS PO TABS
3000.0000 [IU] | ORAL_TABLET | Freq: Three times a day (TID) | ORAL | Status: DC | PRN
  Filled 2020-11-29 (×3): qty 2

## 2020-11-29 MED ORDER — BUPIVACAINE LIPOSOME 1.3 % IJ SUSP
INTRAMUSCULAR | Status: DC | PRN
  Administered 2020-11-29: 20 mL

## 2020-11-29 MED ORDER — STERILE WATER FOR IRRIGATION IR SOLN
Status: DC | PRN
  Administered 2020-11-29: 1000 mL

## 2020-11-29 MED ORDER — SIMETHICONE 80 MG PO CHEW: 80.0000 mg | CHEWABLE_TABLET | Freq: Four times a day (QID) | ORAL | Status: DC | PRN

## 2020-11-29 MED ORDER — PROMETHAZINE HCL 25 MG/ML IJ SOLN: 6.2500 mg | INTRAMUSCULAR | Status: DC | PRN

## 2020-11-29 MED ORDER — ONDANSETRON HCL 4 MG/2ML IJ SOLN
INTRAMUSCULAR | Status: DC | PRN
  Administered 2020-11-29: 4 mg via INTRAVENOUS

## 2020-11-29 MED ORDER — ENSURE MAX PROTEIN PO LIQD
2.0000 [oz_av] | ORAL | Status: DC
  Administered 2020-11-30 (×3): 2 [oz_av] via ORAL

## 2020-11-29 MED ORDER — PANTOPRAZOLE SODIUM 40 MG IV SOLR
40.0000 mg | Freq: Every day | INTRAVENOUS | Status: DC
  Administered 2020-11-29: 22:00:00 40 mg via INTRAVENOUS
  Filled 2020-11-29: qty 40

## 2020-11-29 MED ORDER — CLOMIPRAMINE HCL 25 MG PO CAPS
150.0000 mg | ORAL_CAPSULE | Freq: Every day | ORAL | Status: DC
  Administered 2020-11-29: 22:00:00 150 mg via ORAL
  Filled 2020-11-29: qty 6

## 2020-11-29 MED ORDER — HYDROMORPHONE HCL 1 MG/ML IJ SOLN
0.5000 mg | INTRAMUSCULAR | Status: DC | PRN
Start: 2020-11-29 — End: 2020-11-30

## 2020-11-29 MED ORDER — CHLORHEXIDINE GLUCONATE 0.12 % MT SOLN
15.0000 mL | Freq: Once | OROMUCOSAL | Status: AC
  Administered 2020-11-29: 06:00:00 15 mL via OROMUCOSAL

## 2020-11-29 MED ORDER — DEXAMETHASONE SODIUM PHOSPHATE 10 MG/ML IJ SOLN
INTRAMUSCULAR | Status: DC | PRN
  Administered 2020-11-29: 8 mg via INTRAVENOUS

## 2020-11-29 MED ORDER — METHOCARBAMOL 500 MG IVPB - SIMPLE MED
500.0000 mg | Freq: Four times a day (QID) | INTRAVENOUS | Status: DC | PRN
  Filled 2020-11-29: qty 50

## 2020-11-29 MED ORDER — CHLORHEXIDINE GLUCONATE 4 % EX LIQD: 60.0000 mL | Freq: Once | CUTANEOUS | Status: DC

## 2020-11-29 MED ORDER — LACTATED RINGERS IV SOLN: INTRAVENOUS | Status: DC

## 2020-11-29 MED ORDER — GLYCOPYRROLATE 0.2 MG/ML IJ SOLN
INTRAMUSCULAR | Status: DC | PRN
  Administered 2020-11-29: .2 mg via INTRAVENOUS

## 2020-11-29 MED ORDER — PROPOFOL 10 MG/ML IV BOLUS
INTRAVENOUS | Status: DC | PRN
  Administered 2020-11-29: 200 mg via INTRAVENOUS

## 2020-11-29 MED ORDER — ROCURONIUM BROMIDE 10 MG/ML (PF) SYRINGE
PREFILLED_SYRINGE | INTRAVENOUS | Status: DC | PRN
  Administered 2020-11-29: 40 mg via INTRAVENOUS
  Administered 2020-11-29: 20 mg via INTRAVENOUS
  Administered 2020-11-29: 10 mg via INTRAVENOUS

## 2020-11-29 MED ORDER — ENOXAPARIN SODIUM 30 MG/0.3ML ~~LOC~~ SOLN
30.0000 mg | Freq: Two times a day (BID) | SUBCUTANEOUS | Status: DC
  Administered 2020-11-29 – 2020-11-30 (×2): 30 mg via SUBCUTANEOUS
  Filled 2020-11-29 (×3): qty 0.3

## 2020-11-29 MED ORDER — LIDOCAINE 20MG/ML (2%) 15 ML SYRINGE OPTIME
INTRAMUSCULAR | Status: DC | PRN
  Administered 2020-11-29: 1.5 mg/kg/h via INTRAVENOUS

## 2020-11-29 MED ORDER — ACETAMINOPHEN 500 MG PO TABS
1000.0000 mg | ORAL_TABLET | ORAL | Status: AC
  Administered 2020-11-29: 06:00:00 1000 mg via ORAL
  Filled 2020-11-29: qty 2

## 2020-11-29 MED ORDER — BUPIVACAINE-EPINEPHRINE 0.25% -1:200000 IJ SOLN
INTRAMUSCULAR | Status: DC | PRN
  Administered 2020-11-29: 30 mL

## 2020-11-29 MED ORDER — ORAL CARE MOUTH RINSE: 15.0000 mL | Freq: Once | OROMUCOSAL | Status: AC

## 2020-11-29 MED ORDER — MEPERIDINE HCL 50 MG/ML IJ SOLN
6.2500 mg | INTRAMUSCULAR | Status: DC | PRN
Start: 2020-11-29 — End: 2020-11-29

## 2020-11-29 MED ORDER — LACTATED RINGERS IR SOLN
Status: DC | PRN
  Administered 2020-11-29: 1000 mL

## 2020-11-29 MED ORDER — KETOROLAC TROMETHAMINE 15 MG/ML IJ SOLN
15.0000 mg | Freq: Three times a day (TID) | INTRAMUSCULAR | Status: DC | PRN
  Administered 2020-11-29: 12:00:00 15 mg via INTRAVENOUS
  Filled 2020-11-29: qty 1

## 2020-11-29 MED ORDER — BUPIVACAINE-EPINEPHRINE (PF) 0.25% -1:200000 IJ SOLN
INTRAMUSCULAR | Status: AC
  Filled 2020-11-29: qty 30

## 2020-11-29 MED ORDER — GABAPENTIN 300 MG PO CAPS
300.0000 mg | ORAL_CAPSULE | ORAL | Status: AC
  Administered 2020-11-29: 06:00:00 300 mg via ORAL
  Filled 2020-11-29: qty 1

## 2020-11-29 MED ORDER — SODIUM CHLORIDE 0.9 % IV SOLN
2.0000 g | INTRAVENOUS | Status: AC
  Administered 2020-11-29: 08:00:00 2 g via INTRAVENOUS
  Filled 2020-11-29: qty 2

## 2020-11-29 MED ORDER — 0.9 % SODIUM CHLORIDE (POUR BTL) OPTIME
TOPICAL | Status: DC | PRN
  Administered 2020-11-29: 09:00:00 1000 mL

## 2020-11-29 MED ORDER — KETAMINE HCL 10 MG/ML IJ SOLN
INTRAMUSCULAR | Status: AC
  Filled 2020-11-29: qty 1

## 2020-11-29 MED ORDER — KETAMINE HCL 10 MG/ML IJ SOLN
INTRAMUSCULAR | Status: DC | PRN
  Administered 2020-11-29 (×2): 15 mg via INTRAVENOUS

## 2020-11-29 MED ORDER — HEPARIN SODIUM (PORCINE) 5000 UNIT/ML IJ SOLN
5000.0000 [IU] | INTRAMUSCULAR | Status: AC
  Administered 2020-11-29: 06:00:00 5000 [IU] via SUBCUTANEOUS
  Filled 2020-11-29: qty 1

## 2020-11-29 MED ORDER — FIBRIN SEALANT 2 ML SINGLE DOSE KIT
2.0000 mL | PACK | Freq: Once | CUTANEOUS | Status: AC
  Administered 2020-11-29: 2 mL via TOPICAL
  Filled 2020-11-29: qty 2

## 2020-11-29 MED ORDER — ACETAMINOPHEN 160 MG/5ML PO SOLN
1000.0000 mg | Freq: Three times a day (TID) | ORAL | Status: DC
  Administered 2020-11-29 – 2020-11-30 (×3): 1000 mg via ORAL
  Filled 2020-11-29 (×3): qty 40.6

## 2020-11-29 MED ORDER — MIDAZOLAM HCL 2 MG/2ML IJ SOLN
INTRAMUSCULAR | Status: AC
  Filled 2020-11-29: qty 2

## 2020-11-29 MED ORDER — "VISTASEAL 4 ML SINGLE DOSE KIT "
4.0000 mL | PACK | Freq: Once | CUTANEOUS | Status: AC
  Administered 2020-11-29: 4 mL via TOPICAL
  Filled 2020-11-29: qty 4

## 2020-11-29 MED ORDER — ONDANSETRON HCL 4 MG/2ML IJ SOLN
4.0000 mg | INTRAMUSCULAR | Status: DC | PRN
  Administered 2020-11-29: 12:00:00 4 mg via INTRAVENOUS
  Filled 2020-11-29: qty 2

## 2020-11-29 MED ORDER — PROMETHAZINE HCL 25 MG/ML IJ SOLN: 12.5000 mg | Freq: Four times a day (QID) | INTRAMUSCULAR | Status: DC | PRN

## 2020-11-29 MED ORDER — SUCCINYLCHOLINE CHLORIDE 200 MG/10ML IV SOSY
PREFILLED_SYRINGE | INTRAVENOUS | Status: AC
  Filled 2020-11-29: qty 10

## 2020-11-29 MED ORDER — SUCCINYLCHOLINE CHLORIDE 200 MG/10ML IV SOSY
PREFILLED_SYRINGE | INTRAVENOUS | Status: DC | PRN
  Administered 2020-11-29: 140 mg via INTRAVENOUS

## 2020-11-29 MED ORDER — LAMOTRIGINE 150 MG PO TABS
150.0000 mg | ORAL_TABLET | Freq: Every day | ORAL | Status: DC
  Administered 2020-11-29 – 2020-11-30 (×2): 150 mg via ORAL
  Filled 2020-11-29 (×3): qty 1

## 2020-11-29 MED ORDER — OXYCODONE HCL 5 MG/5ML PO SOLN
5.0000 mg | Freq: Four times a day (QID) | ORAL | Status: DC | PRN
  Administered 2020-11-29 – 2020-11-30 (×2): 5 mg via ORAL
  Filled 2020-11-29 (×2): qty 5

## 2020-11-29 MED ORDER — DOCUSATE SODIUM 100 MG PO CAPS
100.0000 mg | ORAL_CAPSULE | Freq: Two times a day (BID) | ORAL | Status: DC
  Administered 2020-11-29: 13:00:00 100 mg via ORAL
  Filled 2020-11-29 (×3): qty 1

## 2020-11-29 MED ORDER — ZONISAMIDE 100 MG PO CAPS
200.0000 mg | ORAL_CAPSULE | Freq: Every day | ORAL | Status: DC
  Administered 2020-11-29: 22:00:00 200 mg via ORAL
  Filled 2020-11-29: qty 2

## 2020-11-29 MED ORDER — HYDRALAZINE HCL 20 MG/ML IJ SOLN: 10.0000 mg | INTRAMUSCULAR | Status: DC | PRN

## 2020-11-29 MED ORDER — DEXAMETHASONE SODIUM PHOSPHATE 10 MG/ML IJ SOLN
INTRAMUSCULAR | Status: AC
  Filled 2020-11-29: qty 1

## 2020-11-29 MED ORDER — SERTRALINE HCL 100 MG PO TABS
100.0000 mg | ORAL_TABLET | Freq: Every day | ORAL | Status: DC
  Administered 2020-11-30: 09:00:00 100 mg via ORAL
  Filled 2020-11-29: qty 1

## 2020-11-29 MED ORDER — SUGAMMADEX SODIUM 200 MG/2ML IV SOLN
INTRAVENOUS | Status: DC | PRN
  Administered 2020-11-29: 200 mg via INTRAVENOUS

## 2020-11-29 MED ORDER — LIDOCAINE 2% (20 MG/ML) 5 ML SYRINGE
INTRAMUSCULAR | Status: DC | PRN
  Administered 2020-11-29: 100 mg via INTRAVENOUS

## 2020-11-29 MED ORDER — RIMEGEPANT SULFATE 75 MG PO TBDP: 75.0000 mg | ORAL_TABLET | Freq: Every day | ORAL | Status: DC | PRN

## 2020-11-29 MED ORDER — MIDAZOLAM HCL 2 MG/2ML IJ SOLN
INTRAMUSCULAR | Status: DC | PRN
  Administered 2020-11-29: 2 mg via INTRAVENOUS

## 2020-11-29 MED ORDER — PRAMIPEXOLE DIHYDROCHLORIDE 1 MG PO TABS
1.0000 mg | ORAL_TABLET | Freq: Every day | ORAL | Status: DC
  Administered 2020-11-29: 22:00:00 1 mg via ORAL
  Filled 2020-11-29: qty 1

## 2020-11-29 MED ORDER — ONDANSETRON HCL 4 MG/2ML IJ SOLN
INTRAMUSCULAR | Status: AC
  Filled 2020-11-29: qty 2

## 2020-11-29 MED ORDER — SCOPOLAMINE 1 MG/3DAYS TD PT72
1.0000 | MEDICATED_PATCH | TRANSDERMAL | Status: DC
  Administered 2020-11-29: 06:00:00 1.5 mg via TRANSDERMAL
  Filled 2020-11-29: qty 1

## 2020-11-29 SURGICAL SUPPLY — 72 items
APPLICATOR COTTON TIP 6 STRL (MISCELLANEOUS) IMPLANT
APPLICATOR COTTON TIP 6IN STRL (MISCELLANEOUS)
APPLICATOR VISTASEAL 35 (MISCELLANEOUS) ×4 IMPLANT
APPLIER CLIP ROT 13.4 12 LRG (CLIP)
BENZOIN TINCTURE PRP APPL 2/3 (GAUZE/BANDAGES/DRESSINGS) ×4 IMPLANT
BLADE SURG SZ11 CARB STEEL (BLADE) ×4 IMPLANT
BNDG ADH 1X3 SHEER STRL LF (GAUZE/BANDAGES/DRESSINGS) ×24 IMPLANT
CABLE HIGH FREQUENCY MONO STRZ (ELECTRODE) IMPLANT
CHLORAPREP W/TINT 26 (MISCELLANEOUS) ×8 IMPLANT
CLIP APPLIE ROT 13.4 12 LRG (CLIP) IMPLANT
CLIP SUT LAPRA TY ABSORB (SUTURE) ×8 IMPLANT
CLOSURE WOUND 1/2 X4 (GAUZE/BANDAGES/DRESSINGS) ×1
COVER SURGICAL LIGHT HANDLE (MISCELLANEOUS) ×4 IMPLANT
COVER WAND RF STERILE (DRAPES) IMPLANT
DEVICE SUT QUICK LOAD TK 5 (STAPLE) IMPLANT
DEVICE SUT TI-KNOT TK 5X26 (MISCELLANEOUS) IMPLANT
DEVICE SUTURE ENDOST 10MM (ENDOMECHANICALS) ×4 IMPLANT
DEVICE TI KNOT TK5 (MISCELLANEOUS)
DRAIN PENROSE 0.25X18 (DRAIN) ×4 IMPLANT
ELECT REM PT RETURN 15FT ADLT (MISCELLANEOUS) ×4 IMPLANT
GAUZE 4X4 16PLY RFD (DISPOSABLE) ×4 IMPLANT
GAUZE SPONGE 4X4 12PLY STRL (GAUZE/BANDAGES/DRESSINGS) IMPLANT
GLOVE BIO SURGEON STRL SZ 6 (GLOVE) ×4 IMPLANT
GLOVE INDICATOR 6.5 STRL GRN (GLOVE) ×4 IMPLANT
GOWN STRL REUS W/TWL LRG LVL3 (GOWN DISPOSABLE) ×4 IMPLANT
GOWN STRL REUS W/TWL XL LVL3 (GOWN DISPOSABLE) ×12 IMPLANT
KIT BASIN OR (CUSTOM PROCEDURE TRAY) ×4 IMPLANT
KIT GASTRIC LAVAGE 34FR ADT (SET/KITS/TRAYS/PACK) ×4 IMPLANT
KIT TURNOVER KIT A (KITS) IMPLANT
MARKER SKIN DUAL TIP RULER LAB (MISCELLANEOUS) ×4 IMPLANT
MAT PREVALON FULL STRYKER (MISCELLANEOUS) ×4 IMPLANT
NEEDLE SPNL 22GX3.5 QUINCKE BK (NEEDLE) ×4 IMPLANT
PACK CARDIOVASCULAR III (CUSTOM PROCEDURE TRAY) ×4 IMPLANT
PENCIL SMOKE EVACUATOR (MISCELLANEOUS) IMPLANT
QUICK LOAD TK 5 (STAPLE)
RELOAD ENDO STITCH 2.0 (ENDOMECHANICALS) ×32
RELOAD STAPLER BLUE 60MM (STAPLE) ×8 IMPLANT
RELOAD STAPLER GOLD 60MM (STAPLE) ×2 IMPLANT
RELOAD STAPLER GREEN 60MM (STAPLE) ×2 IMPLANT
RELOAD STAPLER WHITE 60MM (STAPLE) ×4 IMPLANT
SCISSORS LAP 5X45 EPIX DISP (ENDOMECHANICALS) ×4 IMPLANT
SET IRRIG TUBING LAPAROSCOPIC (IRRIGATION / IRRIGATOR) ×4 IMPLANT
SET TUBE SMOKE EVAC HIGH FLOW (TUBING) ×4 IMPLANT
SHEARS HARMONIC ACE PLUS 45CM (MISCELLANEOUS) ×4 IMPLANT
SLEEVE XCEL OPT CAN 5 100 (ENDOMECHANICALS) ×8 IMPLANT
SOL ANTI FOG 6CC (MISCELLANEOUS) ×2 IMPLANT
SOLUTION ANTI FOG 6CC (MISCELLANEOUS) ×2
STAPLER ECHELON BIOABSB 60 FLE (MISCELLANEOUS) IMPLANT
STAPLER ECHELON LONG 60 440 (INSTRUMENTS) ×4 IMPLANT
STAPLER RELOAD BLUE 60MM (STAPLE) ×16
STAPLER RELOAD GOLD 60MM (STAPLE) ×4
STAPLER RELOAD GREEN 60MM (STAPLE) ×4
STAPLER RELOAD WHITE 60MM (STAPLE) ×8
STRIP CLOSURE SKIN 1/2X4 (GAUZE/BANDAGES/DRESSINGS) ×3 IMPLANT
SUT MNCRL AB 4-0 PS2 18 (SUTURE) ×4 IMPLANT
SUT RELOAD ENDO STITCH 2 48X1 (ENDOMECHANICALS) ×8
SUT RELOAD ENDO STITCH 2.0 (ENDOMECHANICALS) ×8
SUT SURGIDAC NAB ES-9 0 48 120 (SUTURE) IMPLANT
SUT VIC AB 2-0 SH 27 (SUTURE) ×4
SUT VIC AB 2-0 SH 27X BRD (SUTURE) ×2 IMPLANT
SUTURE RELOAD END STTCH 2 48X1 (ENDOMECHANICALS) ×8 IMPLANT
SUTURE RELOAD ENDO STITCH 2.0 (ENDOMECHANICALS) ×8 IMPLANT
SYR 10ML ECCENTRIC (SYRINGE) ×4 IMPLANT
SYR 20ML LL LF (SYRINGE) ×8 IMPLANT
TOWEL OR 17X26 10 PK STRL BLUE (TOWEL DISPOSABLE) ×4 IMPLANT
TOWEL OR NON WOVEN STRL DISP B (DISPOSABLE) ×4 IMPLANT
TRAY FOLEY MTR SLVR 16FR STAT (SET/KITS/TRAYS/PACK) ×4 IMPLANT
TROCAR BLADELESS OPT 5 100 (ENDOMECHANICALS) ×4 IMPLANT
TROCAR UNIVERSAL OPT 12M 100M (ENDOMECHANICALS) ×4 IMPLANT
TROCAR XCEL 12X100 BLDLESS (ENDOMECHANICALS) ×4 IMPLANT
TUBING CONNECTING 10 (TUBING) IMPLANT
TUBING CONNECTING 10' (TUBING)

## 2020-11-29 NOTE — Op Note (Signed)
Operative Note  Gloria Rodriguez  [id number]  [id number]  11/29/2020   Surgeon: Clovis Riley MD   Assistant: Greer Pickerel MD   Procedure performed: laparoscopic Roux-en-Y gastric bypass ( antecolic, antegastric), primary incisional hernia repair (lower abdomen), upper endoscopy   Preop diagnosis: Morbid obesity Body mass index is 42.7 kg/m. Post-op diagnosis/intraop findings: same   Specimens: none Retained items: none  EBL: minimal cc Complications: none   Description of procedure: After obtaining informed consent and administration of prophylactic heparin in holding, the patient was taken to the operating room and placed supine on operating room table where general endotracheal anesthesia was initiated, preoperative antibiotics were administered, SCDs applied, and a formal timeout was performed.  Foley catheter was inserted which is removed at the end of the case.  The abdomen was prepped and draped in usual sterile fashion. Peritoneal access was gained using a Visiport technique in the left upper quadrant and insufflation to 15 mmHg ensued without issue. Gross inspection revealed no evidence of injury. Under direct visualization the remaining trochars were inserted.  There is a low midline vertically oriented hernia containing incarcerated omentum, the omentum was reduced and divided with the harmonic scalpel.  A small stab incision was made over this area and the hernia was closed vertically with 3 interrupted 0 Vicryls to prevent early postoperative bowel incarceration/obstruction.  A laparoscopic assisted bilateral taps block was performed using Exparel mixed with Marcaine.   The omentum was reflected cephalad and the ligament of Treitz identified. The small bowel was followed to a point 50 cm distal to ligament of Treitz at which location the bowel was divided with a white load linear cutting stapler. A Penrose was sutured to the Roux side of the staple line for future identification. The  bowel was measured another 100 cm distal to this and and the site for the jejunojejunostomy was aligned with the end of the biliopancreatic limb. Enterotomies were made with the Harmonic scalpel and the anastomosis was created with the 60 mm white load linear cutting stapler. The common enterotomy was closed with running 3-0 Vicryl starting on either end and tying centrally. The mesenteric defect was closed with running silk suture secured with Lapra-Ty's. The anastomosis was inspected and appeared widely patent, hemostatic with no gaps in the suture line.  Vistaseal was injected over the anastomosis. We then divided the omentum using the harmonic scalpel.   The patient was then placed in steep reverse Trendelenburg. The liver retractor was inserted through a subxiphoid incision and secured for fixed retraction of the left lobe. The Harmonic scalpel was used to enter the perigastric plane and the lesser sac at a point 5 cm distal to the GE junction on the lesser curve. The angle of His was gently bluntly dissected and the target shape of the pouch visualized to exclude any residual fundus. After confirming that all tubes have been removed from the stomach, the gastric pouch was created with serial fires of the linear cutting stapler. As we approached the angle of His, the Ewald tube was inserted to confirm no impingement on the GE junction.   The Roux limb with its attached Penrose drain was then identified and brought up to meet the gastric pouch ensuring no twist in the small bowel mesentery. The staple line of the small bowel is directed to the patient's left side. A running 3-0 Vicryl was used to create a posterior suture line for our anastomosis between the gastric pouch and the small bowel. Gastrotomy  and enterotomy was made with the Harmonic scalpel and a blue load linear cutting stapler was used to create a gastrojejunal anastomosis approximately 2.5cm wide. The common enterotomy was closed with running  3-0 Vicryl starting at either end and tying centrally. At this juncture the Ewald tube was passed through the gastrojejunal anastomosis, ensuring patency while the anterior is completed. An anterior layer of running 3-0 Vicryl secured with Lapra-Ty's was used to complete the gastrojejunal anastomosis. The ewald tube was removed without difficulty. The [place] space was closed with a figure-of-eight silk suture.   At this point Dr. Redmond Pulling performed an upper endoscopy with the Roux limb gently clamped with a bowel clamp. Irrigation is instilled in the upper abdomen for a leak test. Please see his separate operative note- the anastomosis is noted to be patent and hemostatic without any leak or bubbles present. The endoscope was removed and the abdomen once again surveyed. The liver retractor was removed under direct visualization. The abdomen was then desufflated and all remaining trochars removed. The skin incisions were closed with running subcuticular Monocryl; benzoin, Steri-Strips and Band-Aids were applied. The patient was then awakened, extubated and taken to PACU in stable condition.     All counts were correct at the completion of the case.

## 2020-11-29 NOTE — Op Note (Signed)
UNKNOWN FLANNIGAN [id number] October 01, 1976 11/29/2020  Preoperative diagnosis: severe obesity  Postoperative diagnosis: Same   Procedure: Upper endoscopy   Surgeon: Gayland Curry M.D., FACS   Anesthesia: Gen.   Indications for procedure: 44 y.o. yo female undergoing a laparoscopic roux en y gastric bypass and an upper endoscopy was requested to evaluate the anastomosis.  Description of procedure: After we have completed the new gastrojejunostomy, I scrubbed out and obtained the Olympus endoscope. I gently placed endoscope in the patient's oropharynx and gently glided it down the esophagus without any difficulty under direct visualization. Once I was in the gastric pouch, I insufflated the pouch was air. The pouch was approximately 5 cm (z line at 38 cm, anastomosis at 43 cm) in size. I was able to cannulate and advanced the scope through the gastrojejunostomy. Dr.Connor had placed saline in the upper abdomen. Upon further insufflation of the gastric pouch there was no evidence of bubbles. Upon further inspection of the gastric pouch, the mucosa appeared normal. There is no evidence of any mucosal abnormality. The gastric pouch and Roux limb were decompressed. The width of the gastrojejunal anastomosis was at least 2.5 cm. The scope was withdrawn. The patient tolerated this portion of the procedure well. Please see Dr Ron Parker operative note for details regarding the laparoscopic roux-en-y gastric bypass.  Leighton Ruff. Redmond Pulling, MD, FACS General, Bariatric, & Minimally Invasive Surgery [facility], [place]

## 2020-11-29 NOTE — Interval H&P Note (Signed)
History and Physical Interval Note:  11/29/2020 7:05 AM  Gloria Rodriguez  has presented today for surgery, with the diagnosis of morbid obesity.  The various methods of treatment have been discussed with the patient and family. After consideration of risks, benefits and other options for treatment, the patient has consented to  Procedure(s): LAPAROSCOPIC ROUX-EN-Y GASTRIC BYPASS WITH UPPER ENDOSCOPY (N/A) UPPER GI ENDOSCOPY (N/A) as a surgical intervention.  The patient's history has been reviewed, patient examined, no change in status, stable for surgery.  I have reviewed the patient's chart and labs.  Questions were answered to the patient's satisfaction.     Cameo Schmiesing Rich Brave

## 2020-11-29 NOTE — Anesthesia Preprocedure Evaluation (Signed)
Anesthesia Evaluation  Patient identified by MRN, date of birth, ID band Patient awake    Reviewed: Allergy & Precautions, H&P , NPO status , Patient's Chart, lab work & pertinent test results  Airway Mallampati: II  TM Distance: >3 FB Neck ROM: Full    Dental  (+) Teeth Intact, Dental Advisory Given   Pulmonary shortness of breath, neg sleep apnea,    Pulmonary exam normal breath sounds clear to auscultation       Cardiovascular negative cardio ROS Normal cardiovascular exam Rhythm:Regular Rate:Normal     Neuro/Psych  Headaches, PSYCHIATRIC DISORDERS Anxiety Depression negative neurological ROS  negative psych ROS   GI/Hepatic negative GI ROS, Neg liver ROS, GERD  Poorly Controlled,  Endo/Other  Morbid obesity  Renal/GU negative Renal ROS  negative genitourinary   Musculoskeletal negative musculoskeletal ROS (+)   Abdominal   Peds negative pediatric ROS (+)  Hematology negative hematology ROS (+)   Anesthesia Other Findings   Reproductive/Obstetrics (+) Breast feeding  (Patient informed to pump and discard breast milk for 24 hours post op)                             Anesthesia Physical  Anesthesia Plan  ASA: II  Anesthesia Plan: General   Post-op Pain Management:    Induction: Intravenous  PONV Risk Score and Plan: 4 or greater and Ondansetron, Dexamethasone, Treatment may vary due to age or medical condition, Midazolam and Scopolamine patch - Pre-op  Airway Management Planned: Oral ETT  Additional Equipment: None  Intra-op Plan:   Post-operative Plan: Extubation in OR  Informed Consent: I have reviewed the patients History and Physical, chart, labs and discussed the procedure including the risks, benefits and alternatives for the proposed anesthesia with the patient or authorized representative who has indicated his/her understanding and acceptance.     Dental advisory  given  Plan Discussed with: CRNA  Anesthesia Plan Comments:         Anesthesia Quick Evaluation

## 2020-11-29 NOTE — Anesthesia Postprocedure Evaluation (Signed)
Anesthesia Post Note  Patient: Gloria Rodriguez  Procedure(s) Performed: LAPAROSCOPIC ROUX-EN-Y GASTRIC BYPASS WITH UPPER ENDOSCOPY (N/A Abdomen) UPPER GI ENDOSCOPY (N/A )     Patient location during evaluation: PACU Anesthesia Type: General Level of consciousness: sedated and patient cooperative Pain management: pain level controlled Vital Signs Assessment: post-procedure vital signs reviewed and stable Respiratory status: spontaneous breathing Cardiovascular status: stable Anesthetic complications: no   No complications documented.  Last Vitals:  Vitals:   11/29/20 1313 11/29/20 1405  BP: (!) 148/90 (!) 143/82  Pulse: 75 82  Resp: 18 18  Temp:  36.5 C  SpO2: 95% 96%    Last Pain:  Vitals:   11/29/20 1405  TempSrc: Oral  PainSc:                  Nolon Nations

## 2020-11-29 NOTE — Anesthesia Procedure Notes (Addendum)
Procedure Name: Intubation Date/Time: 11/29/2020 7:30 AM Performed by: Eben Burow, CRNA Pre-anesthesia Checklist: Patient identified, Emergency Drugs available, Suction available, Patient being monitored and Timeout performed Patient Re-evaluated:Patient Re-evaluated prior to induction Oxygen Delivery Method: Circle system utilized Preoxygenation: Pre-oxygenation with 100% oxygen Induction Type: IV induction and Rapid sequence Laryngoscope Size: Mac and 4 Grade View: Grade I Tube type: Oral Tube size: 7.0 mm Number of attempts: 1 Airway Equipment and Method: Stylet Placement Confirmation: ETT inserted through vocal cords under direct vision,  positive ETCO2 and breath sounds checked- equal and bilateral Secured at: 22 cm Tube secured with: Tape Dental Injury: Teeth and Oropharynx as per pre-operative assessment

## 2020-11-29 NOTE — Transfer of Care (Signed)
Immediate Anesthesia Transfer of Care Note  Patient: Gloria Rodriguez  Procedure(s) Performed: LAPAROSCOPIC ROUX-EN-Y GASTRIC BYPASS WITH UPPER ENDOSCOPY (N/A Abdomen) UPPER GI ENDOSCOPY (N/A )  Patient Location: PACU  Anesthesia Type:General  Level of Consciousness: awake, alert  and patient cooperative  Airway & Oxygen Therapy: Patient Spontanous Breathing and Patient connected to face mask oxygen  Post-op Assessment: Report given to RN and Post -op Vital signs reviewed and stable  Post vital signs: Reviewed and stable  Last Vitals:  Vitals Value Taken Time  BP    Temp    Pulse    Resp    SpO2      Last Pain:  Vitals:   11/29/20 0617  TempSrc:   PainSc: 0-No pain         Complications: No complications documented.

## 2020-11-29 NOTE — Plan of Care (Signed)
Patient arrived to unit and walked to chair. Pt complains of nausea. Pt unable to demonstrate IS at this time, will try again after nausea medication works.

## 2020-11-29 NOTE — Progress Notes (Signed)
PHARMACY CONSULT FOR:  Risk Assessment for Post-Discharge VTE Following Bariatric Surgery  Post-Discharge VTE Risk Assessment: This patient's probability of 30-day post-discharge VTE is increased due to the factors marked:   Female    Age >/=60 years    BMI >/=50 kg/m2    CHF  x  Dyspnea at Rest    Paraplegia  x  Non-gastric-band surgery    Operation Time >/=3 hr    Return to OR     Length of Stay >/= 3 d   Hx of VTE   Hypercoagulable condition   Significant venous stasis       Predicted probability of 30-day post-discharge VTE: 0.62%  Other patient-specific factors to consider:   Recommendation for Discharge: Enoxaparin 40 mg [place] q12h x 2 weeks post-discharge       Gloria Rodriguez is a 44 y.o. female who underwent laparoscopic Roux-en-Y gastric bypass on 11/29/20   Case start: 0754 Case end: 0948   Allergies  Allergen Reactions  . Other Itching and Other (See Comments)    Any pain med that is in a tablet form. Causes Itching, vomiting,migraine- is the sodium binding in the tablet form   . Codeine Other (See Comments)    migraines  . Hydrocodone Other (See Comments)    migraines  . Oxycodone Other (See Comments)    migraines  . Ultram [Tramadol Hcl] Other (See Comments)    Migraines/nightmares/ STATES ANY SODIUM BINDING TABLET CAUSES ALLERGIC REACTION  . Vicodin [Hydrocodone-Acetaminophen] Other (See Comments)    migraines    Patient Measurements: Height: 5\' 5"  (165.1 cm) Weight: 116.4 kg (256 lb 9.6 oz) IBW/kg (Calculated) : 57 Body mass index is 42.7 kg/m.  No results for input(s): WBC, HGB, HCT, PLT, APTT, CREATININE, LABCREA, CREATININE, CREAT24HRUR, MG, PHOS, ALBUMIN, PROT, ALBUMIN, AST, ALT, ALKPHOS, BILITOT, BILIDIR, IBILI in the last 72 hours. Estimated Creatinine Clearance: 113.3 mL/min (by C-G formula based on SCr of 0.79 mg/dL).    Past Medical History:  Diagnosis Date  . ADD (attention deficit disorder)   . Allergy    allergy meds daily    . Anxiety    hx bipolar, ADHD  . Arthritis    left shoulder   . Bruxism   . Chronic migraine   . Depression    states well controlled  . Diarrhea   . Dyspnea   . GERD (gastroesophageal reflux disease)    hx gallstones and pancreatitis per pt  . Headache(784.0)   . History of kidney stones   . HSV infection    on Valtrex suppression  . Hypotension    normal rens 80/60  . Lactose intolerance   . No pertinent past medical history    states with gall bladder attacks with chest pain, vomiting and diarrhes    EKG  in EPIC  . OCD (obsessive compulsive disorder)   . Periodic limb movement   . Postpartum care and examination of lactating mother 07/13/2011  . Sleep disorder 03/03/2014  . SOB (shortness of breath)   . Swelling      Medications Prior to Admission  Medication Sig Dispense Refill Last Dose  . ALPRAZolam (XANAX) 0.5 MG tablet Take 0.25-0.5 mg by mouth 2 (two) times daily as needed for anxiety.   11/29/2020 at Unknown time  . amphetamine-dextroamphetamine (ADDERALL XR) 30 MG 24 hr capsule Take 30 mg by mouth daily with breakfast.    11/28/2020 at Unknown time  . amphetamine-dextroamphetamine (ADDERALL) 10 MG tablet Take 10 mg by  mouth daily with breakfast.   11/28/2020 at Unknown time  . Cholecalciferol (VITAMIN D3) 250 MCG (10000 UT) capsule Take 10,000 Units by mouth See admin instructions. Mon thru Fri   Past Month at Unknown time  . clomiPRAMINE (ANAFRANIL) 75 MG capsule Take 150 mg by mouth at bedtime.  3 Past Week at Unknown time  . famotidine (PEPCID) 20 MG tablet Take 20 mg by mouth 2 (two) times daily.   11/29/2020 at Unknown time  . hyoscyamine (LEVSIN SL) 0.125 MG SL tablet TAKE 1 TABLET (0.125 MG TOTAL) BY MOUTH EVERY 6 (SIX) HOURS AS NEEDED. *NOT COVERED (Patient taking differently: Take 0.125 mg by mouth every 6 (six) hours as needed for cramping.) 120 tablet 1 Past Month at Unknown time  . lactase (LACTAID) 3000 units tablet Take 3,000-6,000 Units by mouth 3  (three) times daily with meals as needed (lactose intolerance).   Past Week at Unknown time  . lamoTRIgine (LAMICTAL) 150 MG tablet Take 150 mg by mouth daily.   Past Week at Unknown time  . levocetirizine (XYZAL) 5 MG tablet Take 5 mg by mouth at bedtime.   Past Week at Unknown time  . levonorgestrel (MIRENA) 20 MCG/24HR IUD 1 each by Intrauterine route once.     . methocarbamol (ROBAXIN) 500 MG tablet Take 500 mg by mouth 2 (two) times daily as needed (migraines).  0 Past Week at Unknown time  . OVER THE COUNTER MEDICATION Trigger point injections in the scalp of  decadron , Lidocaine, marcaine -for chronic migraines as needed  Gets very 2 months     . OVER THE COUNTER MEDICATION Take 10 mg by mouth in the morning, at noon, and at bedtime. CBD Gummies     . pramipexole (MIRAPEX) 1 MG tablet Take 1 mg by mouth at bedtime.   Past Week at Unknown time  . promethazine (PHENERGAN) 25 MG tablet Take 25 mg by mouth every 6 (six) hours as needed for nausea or vomiting (for migraine headaches).    11/28/2020 at Unknown time  . Rimegepant Sulfate (NURTEC) 75 MG TBDP Take 75 mg by mouth daily as needed (migraines).   Past Week at Unknown time  . sertraline (ZOLOFT) 100 MG tablet Take 100 mg by mouth every morning.   11/29/2020 at Unknown time  . zonisamide (ZONEGRAN) 100 MG capsule Take 200 mg by mouth daily.  1 Past Week at Unknown time  . valACYclovir (VALTREX) 1000 MG tablet Take 1,000 mg by mouth daily as needed (outbreaks).   More than a month at Unknown time       Kara Mead 11/29/2020,11:15 AM

## 2020-11-30 ENCOUNTER — Other Ambulatory Visit (HOSPITAL_COMMUNITY): Payer: Self-pay | Admitting: Surgery

## 2020-11-30 ENCOUNTER — Encounter (HOSPITAL_COMMUNITY): Payer: Self-pay | Admitting: Surgery

## 2020-11-30 LAB — CBC WITH DIFFERENTIAL/PLATELET
Abs Immature Granulocytes: 0.06 10*3/uL (ref 0.00–0.07)
Basophils Absolute: 0 10*3/uL (ref 0.0–0.1)
Basophils Relative: 0 %
Eosinophils Absolute: 0 10*3/uL (ref 0.0–0.5)
Eosinophils Relative: 0 %
HCT: 36.9 % (ref 36.0–46.0)
Hemoglobin: 11.7 g/dL — ABNORMAL LOW (ref 12.0–15.0)
Immature Granulocytes: 1 %
Lymphocytes Relative: 13 %
Lymphs Abs: 1.5 10*3/uL (ref 0.7–4.0)
MCH: 27.4 pg (ref 26.0–34.0)
MCHC: 31.7 g/dL (ref 30.0–36.0)
MCV: 86.4 fL (ref 80.0–100.0)
Monocytes Absolute: 0.8 10*3/uL (ref 0.1–1.0)
Monocytes Relative: 8 %
Neutro Abs: 8.8 10*3/uL — ABNORMAL HIGH (ref 1.7–7.7)
Neutrophils Relative %: 78 %
Platelets: 221 10*3/uL (ref 150–400)
RBC: 4.27 MIL/uL (ref 3.87–5.11)
RDW: 16.6 % — ABNORMAL HIGH (ref 11.5–15.5)
WBC: 11.1 10*3/uL — ABNORMAL HIGH (ref 4.0–10.5)
nRBC: 0 % (ref 0.0–0.2)

## 2020-11-30 LAB — COMPREHENSIVE METABOLIC PANEL
ALT: 20 U/L (ref 0–44)
AST: 18 U/L (ref 15–41)
Albumin: 3.1 g/dL — ABNORMAL LOW (ref 3.5–5.0)
Alkaline Phosphatase: 61 U/L (ref 38–126)
Anion gap: 9 (ref 5–15)
BUN: 11 mg/dL (ref 6–20)
CO2: 19 mmol/L — ABNORMAL LOW (ref 22–32)
Calcium: 7.8 mg/dL — ABNORMAL LOW (ref 8.9–10.3)
Chloride: 107 mmol/L (ref 98–111)
Creatinine, Ser: 0.69 mg/dL (ref 0.44–1.00)
GFR, Estimated: >60 mL/min (ref >60)
Glucose, Bld: 112 mg/dL — ABNORMAL HIGH (ref 70–99)
Potassium: 3.7 mmol/L (ref 3.5–5.1)
Sodium: 135 mmol/L (ref 135–145)
Total Bilirubin: 0.5 mg/dL (ref 0.3–1.2)
Total Protein: 6.6 g/dL (ref 6.5–8.1)

## 2020-11-30 LAB — MAGNESIUM: Magnesium: 2.3 mg/dL (ref 1.7–2.4)

## 2020-11-30 MED ORDER — PANTOPRAZOLE SODIUM 40 MG PO TBEC: 40.0000 mg | DELAYED_RELEASE_TABLET | Freq: Every day | ORAL | 1 refills | Status: DC

## 2020-11-30 MED ORDER — ENOXAPARIN SODIUM 40 MG/0.4ML ~~LOC~~ SOLN: 40.0000 mg | Freq: Two times a day (BID) | SUBCUTANEOUS | 0 refills | Status: DC

## 2020-11-30 MED ORDER — OXYCODONE HCL 5 MG/5ML PO SOLN: 5.0000 mg | Freq: Four times a day (QID) | ORAL | 0 refills | Status: DC | PRN

## 2020-11-30 MED ORDER — ACETAMINOPHEN 160 MG/5ML PO SOLN: 1000.0000 mg | Freq: Three times a day (TID) | ORAL | 0 refills | Status: AC

## 2020-11-30 MED ORDER — ENOXAPARIN (LOVENOX) PATIENT EDUCATION KIT
PACK | Freq: Once | Status: AC
  Filled 2020-11-30: qty 1

## 2020-11-30 MED ORDER — GABAPENTIN 100 MG PO CAPS: 200.0000 mg | ORAL_CAPSULE | Freq: Two times a day (BID) | ORAL | 0 refills | Status: DC

## 2020-11-30 MED FILL — GABAPENTIN 100 MG CAPSULE: 100 | 4 days supply | Qty: 12 | Fill #0

## 2020-11-30 MED FILL — PANTOPRAZOLE SOD DR 40 MG T: 40 | 30 days supply | Qty: 30 | Fill #0

## 2020-11-30 MED FILL — ENOXAPARIN SODIUM 40 MG/0.4: 40 | 14 days supply | Qty: 11 | Fill #0

## 2020-11-30 MED FILL — oxyCODONE HCL 5 MG/5ML SOLN: 5 | 3 days supply | Qty: 50 | Fill #0

## 2020-11-30 NOTE — Discharge Instructions (Signed)
GASTRIC BYPASS/SLEEVE  Home Care Instructions   These instructions are to help you care for yourself when you go home.  Call: If you have any problems. . Call [phone] and ask for the surgeon on call . If you need immediate help, come to the ER at [facility].  . Tell the ER staff that you are a new post-op gastric bypass or gastric sleeve patient   Signs and symptoms to report: . Severe vomiting or nausea o If you cannot keep down clear liquids for longer than 1 day, call your surgeon  . Abdominal pain that does not get better after taking your pain medication . Fever over 100.4 F with chills . Heart beating over 100 beats a minute . Shortness of breath at rest . Chest pain .  Redness, swelling, drainage, or foul odor at incision (surgical) sites .  If your incisions open or pull apart . Swelling or pain in calf (lower leg) . Diarrhea (Loose bowel movements that happen often), frequent watery, uncontrolled bowel movements . Constipation, (no bowel movements for 3 days) if this happens: Pick one o Milk of Magnesia, 2 tablespoons by mouth, 3 times a day for 2 days if needed o Stop taking Milk of Magnesia once you have a bowel movement o Call your doctor if constipation continues Or o Miralax  (instead of Milk of Magnesia) following the label instructions o Stop taking Miralax once you have a bowel movement o Call your doctor if constipation continues . Anything you think is not normal   Normal side effects after surgery: . Unable to sleep at night or unable to focus . Irritability or moody . Being tearful (crying) or depressed These are common complaints, possibly related to your anesthesia medications that put you to sleep, stress of surgery, and change in lifestyle.  This usually goes away a few weeks after surgery.  If these feelings continue, call your primary care doctor.   Wound Care: You may have surgical glue, steri-strips, or staples over your incisions after  surgery . Surgical glue:  Looks like a clear film over your incisions and will wear off a little at a time . Steri-strips: Strips of tape over your incisions. You may notice a yellowish color on the skin under the steri-strips. This is used to make the   steri-strips stick better. Do not pull the steri-strips off - let them fall off . Staples: Jodell Cipro may be removed before you leave the hospital o If you go home with staples, call [facility], [phone]) [phone] at for an appointment with your surgeon's nurse to have staples removed 10 days after surgery. . Showering: You may shower two (2) days after your surgery unless your surgeon tells you differently o Wash gently around incisions with warm soapy water, rinse well, and gently pat dry  o No tub baths until staples are removed, steri-strips fall off or glue is gone.    Medications: Marland Kitchen Medications should be liquid or crushed if larger than the size of a dime . Extended release pills (medication that release a little bit at a time through the day) should NOT be crushed or cut. (examples include XL, ER, DR, SR) . Depending on the size and number of medications you take, you may need to space (take a few throughout the day)/change the time you take your medications so that you do not over-fill your pouch (smaller stomach) . Make sure you follow-up with your primary care doctor to  make medication changes needed during rapid weight loss and life-style changes . If you have diabetes, follow up with the doctor that orders your diabetes medication(s) within one week after surgery and check your blood sugar regularly. . Do not drive while taking prescription pain medication  . It is ok to take Tylenol by the bottle instructions with your pain medicine or instead of your pain medicine as needed.  DO NOT TAKE NSAIDS (EXAMPLES OF NSAIDS:  IBUPROFREN/ NAPROXEN)  Diet:                    First 2 Weeks  You will see the dietician t about two (2) weeks  after your surgery. The dietician will increase the types of foods you can eat if you are handling liquids well: Marland Kitchen If you have severe vomiting or nausea and cannot keep down clear liquids lasting longer than 1 day, call your surgeon @ [phone]) Protein Shake . Drink at least 2 ounces of shake 5-6 times per day . Each serving of protein shakes (usually 8 - 12 ounces) should have: o 15 grams of protein  o And no more than 5 grams of carbohydrate  . Goal for protein each day: o Men = 80 grams per day o Women = 60 grams per day . Protein powder may be added to fluids such as non-fat milk or Lactaid milk or unsweetened Soy/Almond milk (limit to 35 grams added protein powder per serving)  Hydration . Slowly increase the amount of water and other clear liquids as tolerated (See Acceptable Fluids) . Slowly increase the amount of protein shake as tolerated  .  Sip fluids slowly and throughout the day.  Do not use straws. . May use sugar substitutes in small amounts (no more than 6 - 8 packets per day; i.e. Splenda)  Fluid Goal . The first goal is to drink at least 8 ounces of protein shake/drink per day (or as directed by the nutritionist); some examples of protein shakes are [employer], AMR Corporation, EAS Edge HP, and Unjury. See handout from pre-op Bariatric Education Class: o Slowly increase the amount of protein shake you drink as tolerated o You may find it easier to slowly sip shakes throughout the day o It is important to get your proteins in first . Your fluid goal is to drink 64 - 100 ounces of fluid daily o It may take a few weeks to build up to this . 32 oz (or more) should be clear liquids  And  . 32 oz (or more) should be full liquids (see below for examples) . Liquids should not contain sugar, caffeine, or carbonation  Clear Liquids: . Water or Sugar-free flavored water (i.e. Fruit H2O, Propel) . Decaffeinated coffee or tea (sugar-free) . [employer], [employer],  Minute [facility] . Sugar-free Jell-O . Bouillon or broth . Sugar-free Popsicle:   *Less than 20 calories each; Limit 1 per day  Full Liquids: Protein Shakes/Drinks + 2 choices per day of other full liquids . Full liquids must be: o No More Than 15 grams of Carbs per serving  o No More Than 3 grams of Fat per serving . Strained low-fat cream soup (except Cream of Potato or Tomato) . Non-Fat milk . Fat-free Lactaid Milk . Unsweetened Soy Or Unsweetened Almond Milk . Low Sugar yogurt (Dannon Lite & Fit, [place] yogurt; Oikos Triple Zero; Chobani Simply 100; Yoplait 100 calorie [place] - No Fruit on the Bottom)    Vitamins  and Minerals . Start 1 day after surgery unless otherwise directed by your surgeon . Chewable Bariatric Specific Multivitamin / Multimineral Supplement with iron (Example: Bariatric Advantage Multi EA) . Chewable Calcium with Vitamin D-3 (Example: 3 Chewable Calcium Plus 600 with Vitamin D-3) o Take 500 mg three (3) times a day for a total of 1500 mg each day o Do not take all 3 doses of calcium at one time as it may cause constipation, and you can only absorb 500 mg  at a time  o Do not mix multivitamins containing iron with calcium supplements; take 2 hours apart . Menstruating women and those with a history of anemia (a blood disease that causes weakness) may need extra iron o Talk with your doctor to see if you need more iron . Do not stop taking or change any vitamins or minerals until you talk to your dietitian or surgeon . Your Dietitian and/or surgeon must approve all vitamin and mineral supplements   Activity and Exercise: Limit your physical activity as instructed by your doctor.  It is important to continue walking at home.  During this time, use these guidelines: . Do not lift anything greater than ten (10) pounds for at least two (2) weeks . Do not go back to work or drive until Engineer, production says you can . You may have sex when you feel comfortable  o It is  VERY important for female patients to use a reliable birth control method; fertility often increases after surgery  o All hormonal birth control will be ineffective for 30 days after surgery due to medications given during surgery a barrier method must be used. o Do not get pregnant for at least 18 months . Start exercising as soon as your doctor tells you that you can o Make sure your doctor approves any physical activity . Start with a simple walking program . Walk 5-15 minutes each day, 7 days per week.  . Slowly increase until you are walking 30-45 minutes per day Consider joining our [place] program. [phone] or email [email or web address]   Special Instructions Things to remember: . Use your CPAP when sleeping if this applies to you  . [facility] has two free Bariatric Surgery Support Groups that meet monthly o The 3rd Thursday of each month, 6 pm, [facility]  o The 2nd Friday of each month, 11:45 am in the private dining room in the basement of [employer] . It is very important to keep all follow up appointments with your surgeon, dietitian, primary care physician, and behavioral health practitioner . Routine follow up schedule with your surgeon include appointments at 2-3 weeks, 6-8 weeks, 6 months, and 1 year at a minimum.  Your surgeon may request to see you more often.   . After the first year, please follow up with your bariatric surgeon and dietitian at least once a year in order to maintain best weight loss results   [facility]: [place]: [phone] Bariatric Nurse Coordinator: [phone]      Reviewed and Endorsed  by [facility] Patient Education Committee, June, 2016 Edits Approved: Aug, 2018

## 2020-11-30 NOTE — Progress Notes (Signed)
S: Slept well, walking halls, pain is well controlled. Tolerating liquids/ protein shakes without issue.   O: Vitals, labs, intake/output, and orders reviewed at this time.   Gen: A&Ox3, no distress  H&N: EOMI, atraumatic, neck supple Chest: unlabored respirations, RRR Abd: soft, minimally appropriately tender, nondistended, incision(s) c/d/i no cellulitis or hematoma.  Ext: warm, no edema Neuro: grossly normal  Lines/tubes/drains: PIV  A/P: POD 1 RYGB and low midline primary hernia repair Continue clears/ protein shakes Continue ambulation, pulm toilet, chemical and mechanical DVT ppx Plan discharge home later today   Romana Juniper, MD [facility], [place]

## 2020-11-30 NOTE — Progress Notes (Signed)
Patient alert and oriented, pain is controlled. Patient is tolerating fluids, advanced to protein shake today, patient is tolerating well. Reviewed Gastric Bypass discharge instructions with patient and patient is able to articulate understanding. Provided information on BELT program, Support Group and [facility] outpatient pharmacy. Lovenox education provided along with Lovenox education kit.  Patient preferred to watch [email or web address] video  wanted to  with spouse at home.  All questions answered, will continue to monitor.

## 2020-11-30 NOTE — Progress Notes (Signed)
Discharge instructions given to patient and all questions were answered.  

## 2020-12-01 NOTE — Discharge Summary (Signed)
Physician Discharge Summary  NOVA SCHMUHL KVQ:[id number] DOB: Mar 06, 1976 DOA: 11/29/2020  PCP: Kaleen Mask, MD  Admit date: 11/29/2020 Discharge date: 11/30/2020  Recommendations for Outpatient Follow-up:     Follow-up Information    Berna Bue, MD. Go on 12/24/2020.   Specialty: General Surgery Why: at 150pm.  Please arrive 15 minutes prior to appointment.  Thank you Contact information: [address] [place] [address] [phone]        Surgery, Ruth. Go on 01/20/2021.   Specialty: General Surgery Why: at 9 am.  Please arrive 15 minutes prior to appointment.  Thank you Contact information: [address] [place] [address] [phone]              Discharge Diagnoses:  Active Problems:   Morbid obesity [facility])   Surgical Procedure: Laparoscopic Roux-en-Y gastric bypass, upper endoscopy  Discharge Condition: Good Disposition: Home  Diet recommendation: Postoperative gastric bypass diet  Filed Weights   11/29/20 0556  Weight: 116.4 kg     Hospital Course:  The patient was admitted for a planned laparoscopic Roux-en-Y gastric bypass. Please see operative note. Preoperatively the patient was given 5000 units of subcutaneous heparin for DVT prophylaxis. ERAS protocol was used. Postoperative prophylactic Lovenox dosing was started on the evening of postoperative day 0.  The patient was started on ice chips and water on the evening of POD 0 which they tolerated. On postoperative day 1 The patient's diet was advanced to protein shakes which they also tolerated. The patient was ambulating without difficulty. Their vital signs are stable without fever or tachycardia. Their hemoglobin had remained stable. The patient had received discharge instructions and counseling. They were deemed stable for discharge.  BP (!) 150/73 (BP Location: Right Arm)    Pulse 79    Temp 98.6 F (37 C) (Oral)    Resp 18    Ht 5'  5" (1.651 m)    Wt 116.4 kg    SpO2 95%    BMI 42.70 kg/m   Gen: alert, NAD, non-toxic appearing Pupils: equal, no scleral icterus Pulm: Lungs clear to auscultation, symmetric chest rise CV: regular rate and rhythm Abd: soft, min tender, nondistended. No cellulitis. No incisional hernia Ext: no edema, no calf tenderness Skin: no rash, no jaundice  Discharge Instructions   Allergies as of 11/30/2020      Reactions   Other Itching, Other (See Comments)   Any pain med that is in a tablet form. Causes Itching, vomiting,migraine- is the sodium binding in the tablet form    Codeine Other (See Comments)   migraines   Hydrocodone Other (See Comments)   migraines   Oxycodone Other (See Comments)   migraines   Ultram [tramadol Hcl] Other (See Comments)   Migraines/nightmares/ STATES ANY SODIUM BINDING TABLET CAUSES ALLERGIC REACTION   Vicodin [hydrocodone-acetaminophen] Other (See Comments)   migraines      Medication List    STOP taking these medications   famotidine 20 MG tablet Commonly known as: PEPCID     TAKE these medications   acetaminophen 160 MG/5ML solution Commonly known as: TYLENOL Take 31.3 mLs (1,000 mg total) by mouth every 8 (eight) hours for 15 doses.   ALPRAZolam 0.5 MG tablet Commonly known as: XANAX Take 0.25-0.5 mg by mouth 2 (two) times daily as needed for anxiety.   amphetamine-dextroamphetamine 30 MG 24 hr capsule Commonly known as: ADDERALL XR Take 30 mg by mouth daily with breakfast.  amphetamine-dextroamphetamine 10 MG tablet Commonly known as: ADDERALL Take 10 mg by mouth daily with breakfast.   clomiPRAMINE 75 MG capsule Commonly known as: ANAFRANIL Take 150 mg by mouth at bedtime.   enoxaparin 40 MG/0.4ML injection Commonly known as: LOVENOX Inject 0.4 mLs (40 mg total) into the skin every 12 (twelve) hours for 14 days.   gabapentin 100 MG capsule Commonly known as: NEURONTIN Take 2 capsules (200 mg total) by mouth every 12  (twelve) hours.   hyoscyamine 0.125 MG SL tablet Commonly known as: LEVSIN SL TAKE 1 TABLET (0.125 MG TOTAL) BY MOUTH EVERY 6 (SIX) HOURS AS NEEDED. *NOT COVERED What changed: See the new instructions.   lactase 3000 units tablet Commonly known as: LACTAID Take 3,000-6,000 Units by mouth 3 (three) times daily with meals as needed (lactose intolerance).   lamoTRIgine 150 MG tablet Commonly known as: LAMICTAL Take 150 mg by mouth daily.   levocetirizine 5 MG tablet Commonly known as: XYZAL Take 5 mg by mouth at bedtime.   levonorgestrel 20 MCG/24HR IUD Commonly known as: MIRENA 1 each by Intrauterine route once.   methocarbamol 500 MG tablet Commonly known as: ROBAXIN Take 500 mg by mouth 2 (two) times daily as needed (migraines).   Nurtec 75 MG Tbdp Generic drug: Rimegepant Sulfate Take 75 mg by mouth daily as needed (migraines).   OVER THE COUNTER MEDICATION Trigger point injections in the scalp of  decadron , Lidocaine, marcaine -for chronic migraines as needed  Gets very 2 months   OVER THE COUNTER MEDICATION Take 10 mg by mouth in the morning, at noon, and at bedtime. CBD Gummies   oxyCODONE 5 MG/5ML solution Commonly known as: ROXICODONE Take 5 mLs (5 mg total) by mouth every 6 (six) hours as needed for up to 10 doses for moderate pain or severe pain.   pantoprazole 40 MG tablet Commonly known as: Protonix Take 1 tablet (40 mg total) by mouth daily.   pramipexole 1 MG tablet Commonly known as: MIRAPEX Take 1 mg by mouth at bedtime.   promethazine 25 MG tablet Commonly known as: PHENERGAN Take 25 mg by mouth every 6 (six) hours as needed for nausea or vomiting (for migraine headaches).   sertraline 100 MG tablet Commonly known as: ZOLOFT Take 100 mg by mouth every morning.   valACYclovir 1000 MG tablet Commonly known as: VALTREX Take 1,000 mg by mouth daily as needed (outbreaks).   Vitamin D3 250 MCG (10000 UT) capsule Take 10,000 Units by mouth See  admin instructions. Mon thru Fri   zonisamide 100 MG capsule Commonly known as: ZONEGRAN Take 200 mg by mouth daily.       Follow-up Information    Clovis Riley, MD. Go on 12/24/2020.   Specialty: General Surgery Why: at 150pm.  Please arrive 15 minutes prior to appointment.  Thank you Contact information: [address] [place] [address] [phone]        Surgery, Miguel Barrera. Go on 01/20/2021.   Specialty: General Surgery Why: at 9 am.  Please arrive 15 minutes prior to appointment.  Thank you Contact information: [place] [address] [phone]                The results of significant diagnostics from this hospitalization (including imaging, microbiology, ancillary and laboratory) are listed below for reference.    Significant Diagnostic Studies: No results found.  Labs: Basic Metabolic Panel: Recent Labs  Lab 11/26/20 0913  11/30/20 0449  NA 136 135  K 4.4 3.7  CL 103 107  CO2 26 19*  GLUCOSE 92 112*  BUN 23* 11  CREATININE 0.79 0.69  CALCIUM 9.5 7.8*  MG  --  2.3   Liver Function Tests: Recent Labs  Lab 11/26/20 0913 11/30/20 0449  AST 16 18  ALT 20 20  ALKPHOS 78 61  BILITOT 0.4 0.5  PROT 7.7 6.6  ALBUMIN 3.7 3.1*    CBC: Recent Labs  Lab 11/26/20 0913 11/30/20 0449  WBC 9.1 11.1*  NEUTROABS 6.1 8.8*  HGB 13.0 11.7*  HCT 41.1 36.9  MCV 86.2 86.4  PLT 248 221    CBG: No results for input(s): GLUCAP in the last 168 hours.  Active Problems:   Morbid obesity ([place])    Signed:  [facility], [place] 12/01/2020, 11:19 AM

## 2020-12-02 DIAGNOSIS — Z03818 Encounter for observation for suspected exposure to other biological agents ruled out: Secondary | ICD-10-CM | POA: Diagnosis not present

## 2020-12-02 DIAGNOSIS — Z20822 Contact with and (suspected) exposure to covid-19: Secondary | ICD-10-CM | POA: Diagnosis not present

## 2020-12-07 ENCOUNTER — Telehealth (HOSPITAL_COMMUNITY): Payer: Self-pay

## 2020-12-07 NOTE — Telephone Encounter (Signed)
Patient called to discuss post bariatric surgery follow up questions.  See below:   1. Tell me about your pain and pain management?denies  2.  Let's talk about fluid intake.  How much total fluid are you taking in? 64 ounces   3.  How much protein have you taken in the last 2 days?90 grams of protein  4.  Have you had nausea?  Tell me about when have experienced nausea and what you did to help?  nausea when tried to eat a scrambled egg  5.  Has the frequency or color changed with your urine? yellow to clear  6.  Tell me what your incisions look like?itchy, top left incision more sensitive  7.  Have you been passing gas? BM?several bowel movements since discharge  8.  If a problem or question were to arise who would you call?  Do you know contact numbers for [facility], [facility], and [facility]?aware of how to contact   9.  How has the walking going?walking regularly  10.  How are your vitamins and calcium going?  How are you taking them?started mvi and calcium

## 2020-12-14 ENCOUNTER — Encounter: Payer: Medicare HMO | Attending: Surgery | Admitting: Skilled Nursing Facility1

## 2020-12-14 ENCOUNTER — Other Ambulatory Visit: Payer: Self-pay

## 2020-12-14 DIAGNOSIS — E669 Obesity, unspecified: Secondary | ICD-10-CM

## 2020-12-14 DIAGNOSIS — Z6841 Body Mass Index (BMI) 40.0 and over, adult: Secondary | ICD-10-CM | POA: Diagnosis not present

## 2020-12-14 DIAGNOSIS — Z713 Dietary counseling and surveillance: Secondary | ICD-10-CM | POA: Insufficient documentation

## 2020-12-14 DIAGNOSIS — K219 Gastro-esophageal reflux disease without esophagitis: Secondary | ICD-10-CM | POA: Insufficient documentation

## 2020-12-21 ENCOUNTER — Telehealth: Payer: Self-pay | Admitting: Skilled Nursing Facility1

## 2020-12-21 NOTE — Progress Notes (Signed)
2 Week Post-Operative Nutrition Class   Patient was seen on 02/04/19 for Post-Operative Nutrition education at the [facility].    Surgery date:  Surgery type: RYGB Start weight at [facility]: 265 Weight today: 242.3   Body Composition Scale 12/14/2020  Total Body Fat % 44.9  Visceral Fat 14  Fat-Free Mass % 55   Total Body Water % 42   Muscle-Mass lbs 31.3  Body Fat Displacement          Torso  lbs 67.5         Left Leg  lbs 13.5         Right Leg  lbs 13.5         Left Arm  lbs 6.7         Right Arm   lbs 6.7     The following the learning objectives were met by the patient during this course:  Identifies Phase 3 (Soft, High Proteins) Dietary Goals and will begin from 2 weeks post-operatively to 2 months post-operatively  Identifies appropriate sources of fluids and proteins   Identifies appropriate fat sources and healthy verses unhealthy fat types    States protein recommendations and appropriate sources post-operatively  Identifies the need for appropriate texture modifications, mastication, and bite sizes when consuming solids  Identifies appropriate multivitamin and calcium sources post-operatively  Describes the need for physical activity post-operatively and will follow MD recommendations  States when to call healthcare provider regarding medication questions or post-operative complications   Handouts given during class include:  Phase 3A: Soft, High Protein Diet Handout  Phase 3 High Protein Meals  Healthy Fats   Follow-Up Plan: Patient will follow-up at [facility] in 6 weeks for 2 month post-op nutrition visit for diet advancement per MD.

## 2020-12-21 NOTE — Telephone Encounter (Signed)
RD called pt to verify fluid intake once starting soft, solid proteins 2 week post-bariatric surgery.   Daily Fluid intake: Daily Protein intake:  Concerns/issues:   Pt states she has had a lot of nausea and dizziness and lightheaded. Pt states she is not reacting from liquid to solid well. Pt state she is having issues with her medications filling her up. Pt states her pharmacist advised to call the compounding pharmacy. Pt states she seems to be starving all the time and full of gas. Pt states yogurt and protein shakes are easy and solid foods are difficult including tofu and eggs and shrimp and cheese. Pt states she has not tried beans. Pt states she is on a low fodmap diet. Pt states before surgery she has had GERD and lactose intolerance and gluten intolerance.   Advice: Continue to drink protein shakes to get in protein Continue to get your fluid in: 64 fluid ounces  Focus on liquid for now and then you will come back in

## 2020-12-29 ENCOUNTER — Telehealth: Payer: Self-pay | Admitting: Skilled Nursing Facility1

## 2020-12-29 ENCOUNTER — Ambulatory Visit: Payer: Medicare HMO | Admitting: Skilled Nursing Facility1

## 2020-12-29 ENCOUNTER — Encounter: Payer: Medicare HMO | Admitting: Skilled Nursing Facility1

## 2020-12-29 DIAGNOSIS — E669 Obesity, unspecified: Secondary | ICD-10-CM

## 2020-12-29 DIAGNOSIS — Z6841 Body Mass Index (BMI) 40.0 and over, adult: Secondary | ICD-10-CM | POA: Diagnosis not present

## 2020-12-29 DIAGNOSIS — Z713 Dietary counseling and surveillance: Secondary | ICD-10-CM | POA: Diagnosis not present

## 2020-12-29 DIAGNOSIS — K219 Gastro-esophageal reflux disease without esophagitis: Secondary | ICD-10-CM | POA: Diagnosis not present

## 2020-12-29 NOTE — Progress Notes (Signed)
Bariatric Nutrition Follow-Up Visit Medical Nutrition Therapy    NUTRITION ASSESSMENT   RYGB  Appt done via MyChart pt understands limits of visit type and identified by name and DOB  Anthropometrics  Start weight at [facility]: 265 lbs  Today's weight: pt stated 234.6    Clinical  Medical hx: reflux, daily migraine (on disability for this) Medications:  Labs:    Lifestyle & Dietary Hx  Pt states she follows a low fodmap diet.  Pt state she is doing a lot better focusing on half liquid half solid diet meaning . Pt states her [employer] has stopped selling premier protein so ordered some offline.  Pt states she had IBS like symptoms and got surgery to reduce her reflux.  Pt states she had really bad diarrhea from taking Protonix after surgery.  Pt states she has not had any reflux since surgery. Pt state she did not do this surgery for weight loss. Pt states she avoids citrus due to interstitial cystitis. Pt states fibrous foods cause her to have diarrhea. Pt states she is lactose intolerant and avoids gluten.  Pt states hot black tea gives her diarrhea but when it is room temperature and sips slowly it does not do that. Pt states she can take her pills without problem now.  Pt states her migraines cause memory loss for her.  Pt states she wants to have carbs. Pt states she had her appt with her surgeon. Pt states she wonders if she can have sweets: Dietitian advsied to hold off for now until she is more stable.   Pt states she is No longer feeling dizzy  Symptoms in the last week: Migraine due to this unaware of other symptoms   Goals: Only worry about drinking with meals if it causes an issue to hit that minimum of 64 fluid ounces  Continue to aim for that 60 grams of protein: cheese, eggs, chicken, tofu, tuna Limit grits to one packet Eat every 3 hours but not in between that time Limit carbohydrates to 1/4  Include tolerated non starchy vegetables  Limit cheese stick to one at  a time Limit nuts to 1/4 cup  Estimated daily fluid intake: 32+ oz Estimated daily protein intake: 60+ g Supplements: multi and 2 calcium Current average weekly physical activity:  24-Hr Dietary Recall First Meal: 1/2 premier protein shake  Snack:  Chewable multivitamin  Second Meal:  2 packets Grits + isopure protein + butter + salt Snack: 2 colby jack cheese stick sometimes with mayo + mustard Third Meal: eggs or grits Snack:  Beverages: 1/2 cup tea + half and half and monk fruit, water  Post-Op Goals/ Signs/ Symptoms Using straws: no Drinking while eating: no Chewing/swallowing difficulties: no Changes in vision: no Changes to mood/headaches: no Hair loss/changes to skin/nails: no Difficulty focusing/concentrating: no Sweating: no Dizziness/lightheadedness: no Palpitations: no  Carbonated/caffeinated beverages: no N/V/D/C/Gas: yes Abdominal pain: no Dumping syndrome: no    NUTRITION DIAGNOSIS  Overweight/obesity ([place]-3.3) related to past poor dietary habits and physical inactivity as evidenced by completed bariatric surgery and following dietary guidelines for continued weight loss and healthy nutrition status.     NUTRITION INTERVENTION Nutrition counseling (C-1) and education (E-2) to facilitate bariatric surgery goals, including: . The importance of consuming adequate calories as well as certain nutrients daily due to the body's need for essential vitamins, minerals, and fats . The importance of daily physical activity and to reach a goal of at least 150 minutes of moderate to vigorous  physical activity weekly (or as directed by their physician) due to benefits such as increased musculature and improved lab values . The importance of intuitive eating specifically learning hunger-satiety cues and understanding the importance of learning a new body: The importance of mindful eating to avoid grazing behaviors   Learning Style & Readiness for Change Teaching method  utilized: Visual & Auditory  Demonstrated degree of understanding via: Teach Back  Readiness Level: Action Barriers to learning/adherence to lifestyle change: GI tolerance   RD's Notes for Next Visit . Assess adherence to pt chosen goals   MONITORING & EVALUATION Dietary intake, weekly physical activity, body weight  Next Steps Patient is to follow-up in 1 month

## 2020-12-29 NOTE — Telephone Encounter (Signed)
Pt called to make her appt virtual

## 2020-12-30 DIAGNOSIS — F429 Obsessive-compulsive disorder, unspecified: Secondary | ICD-10-CM | POA: Diagnosis not present

## 2020-12-30 DIAGNOSIS — G43909 Migraine, unspecified, not intractable, without status migrainosus: Secondary | ICD-10-CM | POA: Diagnosis not present

## 2020-12-30 DIAGNOSIS — F419 Anxiety disorder, unspecified: Secondary | ICD-10-CM | POA: Diagnosis not present

## 2020-12-30 DIAGNOSIS — K589 Irritable bowel syndrome without diarrhea: Secondary | ICD-10-CM | POA: Diagnosis not present

## 2020-12-30 DIAGNOSIS — G4761 Periodic limb movement disorder: Secondary | ICD-10-CM | POA: Diagnosis not present

## 2020-12-30 DIAGNOSIS — R69 Illness, unspecified: Secondary | ICD-10-CM | POA: Diagnosis not present

## 2020-12-30 DIAGNOSIS — M792 Neuralgia and neuritis, unspecified: Secondary | ICD-10-CM | POA: Diagnosis not present

## 2020-12-30 DIAGNOSIS — K219 Gastro-esophageal reflux disease without esophagitis: Secondary | ICD-10-CM | POA: Diagnosis not present

## 2020-12-30 DIAGNOSIS — G8929 Other chronic pain: Secondary | ICD-10-CM | POA: Diagnosis not present

## 2020-12-30 DIAGNOSIS — F909 Attention-deficit hyperactivity disorder, unspecified type: Secondary | ICD-10-CM | POA: Diagnosis not present

## 2020-12-30 DIAGNOSIS — E669 Obesity, unspecified: Secondary | ICD-10-CM | POA: Diagnosis not present

## 2020-12-30 DIAGNOSIS — J301 Allergic rhinitis due to pollen: Secondary | ICD-10-CM | POA: Diagnosis not present

## 2021-01-06 DIAGNOSIS — M791 Myalgia, unspecified site: Secondary | ICD-10-CM | POA: Diagnosis not present

## 2021-01-06 DIAGNOSIS — M542 Cervicalgia: Secondary | ICD-10-CM | POA: Diagnosis not present

## 2021-01-06 DIAGNOSIS — G43719 Chronic migraine without aura, intractable, without status migrainosus: Secondary | ICD-10-CM | POA: Diagnosis not present

## 2021-01-06 DIAGNOSIS — G518 Other disorders of facial nerve: Secondary | ICD-10-CM | POA: Diagnosis not present

## 2021-01-07 ENCOUNTER — Other Ambulatory Visit: Payer: Self-pay | Admitting: Gastroenterology

## 2021-01-26 ENCOUNTER — Ambulatory Visit: Payer: Medicare HMO | Admitting: Skilled Nursing Facility1

## 2021-01-26 ENCOUNTER — Encounter: Payer: Medicare HMO | Attending: Surgery | Admitting: Skilled Nursing Facility1

## 2021-01-26 DIAGNOSIS — E669 Obesity, unspecified: Secondary | ICD-10-CM | POA: Insufficient documentation

## 2021-01-26 NOTE — Progress Notes (Signed)
Bariatric Nutrition Follow-Up Visit Medical Nutrition Therapy    NUTRITION ASSESSMENT   RYGB  Appt done via MyChart pt understands limits of visit type and identified by name and DOB  Anthropometrics  Start weight at [facility]: 265 lbs  Today's weight: pt stated 234.6    Clinical  Medical hx: reflux, daily migraine (on disability for this) Medications:  Labs:    Lifestyle & Dietary Hx  Pts main concern are her migraines.   Pt states she weighed herself today stating she is 226 pounds.  Pt states she had a very long lasting migraine along with dizziness stating food and schedule is a trigger sating she is on disability for migraines. Pt states she believes she is not getting enough protein stating she was told she needs to eat 100-110 grams of protein. Pt states she goes to healthy weight and wellness. Pt states she does struggle to get enough water in stating she is getting in 32 ounces.  Pt states she is No longer feeling dizzy wonders if should be tested for hypoglycemia: dietitian advised if that is her worry she can get a glucometer from [place].   Dietitian Reviewed multi and calcium options  Symptoms in the last week: continued  Migraine due to this unaware of other symptoms   Goals: continued  Only worry about drinking with meals if it causes an issue to hit that minimum of 64 fluid ounces  Continue to aim for that 60 grams of protein: cheese, eggs, chicken, tofu, tuna Limit grits to one packet Eat every 3 hours but not in between that time Limit carbohydrates to 1/4  Include tolerated non starchy vegetables  Limit cheese stick to one at a time Limit nuts to 1/4 cup  Estimated daily fluid intake: 32+ oz Estimated daily protein intake: 60+ g Supplements: multi and 2 calcium (inconsistant) Current average weekly physical activity:  24-Hr Dietary Recall: 2 protein shakes per day First Meal: [employer] core power  Snack:  Chewable multivitamin  Second Meal:  2 packets  Grits + isopure protein + butter + salt or egg + cheese + gluten free bagel (drank tea with it) Snack: 2 colby jack cheese stick sometimes with mayo + mustard or protein shake Third Meal: eggs or grits or gluten free spaghetti + chicken + marinara + cheese Snack: nuts and dark chocolate raisins  Beverages: 1/2 cup tea + half and half and monk fruit, water  Post-Op Goals/ Signs/ Symptoms Using straws: no Drinking while eating: no Chewing/swallowing difficulties: no Changes in vision: no Changes to mood/headaches: no Hair loss/changes to skin/nails: no Difficulty focusing/concentrating: no Sweating: no Dizziness/lightheadedness: no Palpitations: no  Carbonated/caffeinated beverages: no N/V/D/C/Gas: yes Abdominal pain: no Dumping syndrome: no    NUTRITION DIAGNOSIS  Overweight/obesity ([place]-3.3) related to past poor dietary habits and physical inactivity as evidenced by completed bariatric surgery and following dietary guidelines for continued weight loss and healthy nutrition status.     NUTRITION INTERVENTION Nutrition counseling (C-1) and education (E-2) to facilitate bariatric surgery goals, including: . The importance of consuming adequate calories as well as certain nutrients daily due to the body's need for essential vitamins, minerals, and fats . The importance of daily physical activity and to reach a goal of at least 150 minutes of moderate to vigorous physical activity weekly (or as directed by their physician) due to benefits such as increased musculature and improved lab values . The importance of intuitive eating specifically learning hunger-satiety cues and understanding the importance of learning a  new body: The importance of mindful eating to avoid grazing behaviors   Learning Style & Readiness for Change Teaching method utilized: Visual & Auditory  Demonstrated degree of understanding via: Teach Back  Readiness Level: Action Barriers to learning/adherence to  lifestyle change: GI tolerance   RD's Notes for Next Visit . Assess adherence to pt chosen goals   MONITORING & EVALUATION Dietary intake, weekly physical activity, body weight  Next Steps Patient is to follow-up prn

## 2021-02-07 DIAGNOSIS — L814 Other melanin hyperpigmentation: Secondary | ICD-10-CM | POA: Diagnosis not present

## 2021-02-07 DIAGNOSIS — G43719 Chronic migraine without aura, intractable, without status migrainosus: Secondary | ICD-10-CM | POA: Diagnosis not present

## 2021-02-07 DIAGNOSIS — L821 Other seborrheic keratosis: Secondary | ICD-10-CM | POA: Diagnosis not present

## 2021-02-07 DIAGNOSIS — D235 Other benign neoplasm of skin of trunk: Secondary | ICD-10-CM | POA: Diagnosis not present

## 2021-02-07 DIAGNOSIS — D171 Benign lipomatous neoplasm of skin and subcutaneous tissue of trunk: Secondary | ICD-10-CM | POA: Diagnosis not present

## 2021-02-10 DIAGNOSIS — D170 Benign lipomatous neoplasm of skin and subcutaneous tissue of head, face and neck: Secondary | ICD-10-CM | POA: Diagnosis not present

## 2021-02-18 ENCOUNTER — Other Ambulatory Visit: Payer: Self-pay

## 2021-02-18 ENCOUNTER — Ambulatory Visit: Payer: Medicare HMO | Admitting: Nurse Practitioner

## 2021-02-18 ENCOUNTER — Encounter: Payer: Self-pay | Admitting: Nurse Practitioner

## 2021-02-18 VITALS — BP 116/68 | HR 70 | Temp 99.1°F | Resp 16 | Wt 220.0 lb

## 2021-02-18 DIAGNOSIS — N3090 Cystitis, unspecified without hematuria: Secondary | ICD-10-CM

## 2021-02-18 DIAGNOSIS — Z8619 Personal history of other infectious and parasitic diseases: Secondary | ICD-10-CM

## 2021-02-18 DIAGNOSIS — R309 Painful micturition, unspecified: Secondary | ICD-10-CM

## 2021-02-18 DIAGNOSIS — R82998 Other abnormal findings in urine: Secondary | ICD-10-CM

## 2021-02-18 MED ORDER — VALACYCLOVIR HCL 500 MG PO TABS
500.0000 mg | ORAL_TABLET | Freq: Two times a day (BID) | ORAL | 6 refills | Status: DC
Start: 2021-02-18 — End: 2023-11-14

## 2021-02-18 MED ORDER — NITROFURANTOIN MONOHYD MACRO 100 MG PO CAPS
100.0000 mg | ORAL_CAPSULE | Freq: Two times a day (BID) | ORAL | 0 refills | Status: DC
Start: 2021-02-18 — End: 2022-05-02

## 2021-02-18 NOTE — Progress Notes (Signed)
GYNECOLOGY  VISIT  CC:   Painful urination  HPI: 45 y.o. G30P2002 Married White or Caucasian female here for uti.  "Terrible pain" with urination, cramping, even if just has a tiny bit of urine. Symptoms became bad this am but urgency and frequency started earlier in week. Has history of IC but this pain is increased. 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 Denies vaginal symptoms, itching or burning  Is married, not concerned about vaginal infection  Also reports would like a refill of Valtrex. She does not have an outbreak now but always like to have them on hand in case. Hx genital hsv, has not had an outbreak in "quite some time"  GYNECOLOGIC HISTORY: No LMP recorded. (Menstrual status: IUD). Contraception: iud mirena placed 08-09-2020 Menopausal hormone therapy: none  Patient Active Problem List   Diagnosis Date Noted  . Morbid obesity ([place]) 11/29/2020  . Heartburn   . Regurgitation and rechewing   . Gastroesophageal reflux disease   . Obesity 09/20/2018  . DOE (dyspnea on exertion) 09/19/2018  . Migraine 12/28/2014  . Family history of Hashimoto thyroiditis 04/27/2014  . Sleep disorder 03/03/2014    Past Medical History:  Diagnosis Date  . ADD (attention deficit disorder)   . Allergy    allergy meds daily   . Anxiety    hx bipolar, ADHD  . Arthritis    left shoulder   . Bruxism   . Chronic migraine   . Depression    states well controlled  . Diarrhea   . Dyspnea   . GERD (gastroesophageal reflux disease)    hx gallstones and pancreatitis per pt  . Headache(784.0)   . History of kidney stones   . HSV infection    on Valtrex suppression  . Hypotension    normal rens 80/60  . Lactose intolerance   . No pertinent past medical history    states with gall bladder attacks with chest pain, vomiting and diarrhes    EKG  in [facility]  . OCD  (obsessive compulsive disorder)   . Periodic limb movement   . Postpartum care and examination of lactating mother 07/13/2011  . Sleep disorder 03/03/2014  . SOB (shortness of breath)   . Swelling     Past Surgical History:  Procedure Laterality Date  . [place] STUDY N/A 10/15/2019   Procedure: [place] STUDY;  Surgeon: [name], MD;  Location: [facility];  Service: Gastroenterology;  Laterality: N/A;  . CESAREAN SECTION    . CESAREAN SECTION  07/11/2011   Procedure: CESAREAN SECTION;  Surgeon: Princess Bruins;  Location: [facility];  Service: Gynecology;  Laterality: N/A;  Repeat  . CHOLECYSTECTOMY  11/14/2011   Procedure: LAPAROSCOPIC CHOLECYSTECTOMY WITH INTRAOPERATIVE CHOLANGIOGRAM;  Surgeon: Joyice Faster. Cornett, MD;  Location: [facility];  Service: General;  Laterality: N/A;  Laparoscopic Cholecystectomy with cholangiogram, c-arm  . COLONOSCOPY    . ESOPHAGEAL MANOMETRY N/A 10/15/2019   Procedure: ESOPHAGEAL MANOMETRY (EM);  Surgeon: [name], MD;  Location: [facility];  Service: Gastroenterology;  Laterality: N/A;  . GASTRIC ROUX-EN-Y N/A 11/29/2020   Procedure: LAPAROSCOPIC ROUX-EN-Y GASTRIC BYPASS WITH UPPER ENDOSCOPY;  Surgeon: Clovis Riley, MD;  Location: [facility];  Service: General;  Laterality: N/A;  . LASIK     2007  . mirena     insertion 08-09-2020  . TEE WITHOUT  CARDIOVERSION N/A 11/18/2013   Procedure: TRANSESOPHAGEAL ECHOCARDIOGRAM (TEE);  Surgeon: Laverda Page, MD;  Location: [facility];  Service: Cardiovascular;  Laterality: N/A;  . UPPER GI ENDOSCOPY N/A 11/29/2020   Procedure: UPPER GI ENDOSCOPY;  Surgeon: Clovis Riley, MD;  Location: [facility];  Service: General;  Laterality: N/A;  . WISDOM TOOTH EXTRACTION      MEDS:   Current Outpatient Medications on File Prior to Visit  Medication Sig Dispense Refill  . ALPRAZolam (XANAX) 0.5 MG tablet Take 0.25-0.5 mg by mouth 2 (two) times daily as needed for anxiety.    Marland Kitchen  amphetamine-dextroamphetamine (ADDERALL XR) 30 MG 24 hr capsule Take 30 mg by mouth daily with breakfast.     . amphetamine-dextroamphetamine (ADDERALL) 10 MG tablet Take 10 mg by mouth daily with breakfast.    . cyclobenzaprine (FLEXERIL) 10 MG tablet 1 TABLET TWICE A DAY AS NEEDED. LIMIT 1-2 TREATMENTS PER WEEK    . Erenumab-aooe (AIMOVIG V[place]aman) Inject into the skin.    . hyoscyamine (LEVSIN SL) 0.125 MG SL tablet TAKE 1 TABLET (0.125 MG TOTAL) BY MOUTH EVERY 6 (SIX) HOURS AS NEEDED. *NOT COVERED (Patient taking differently: Take 0.125 mg by mouth every 6 (six) hours as needed for cramping.) 120 tablet 1  . lactase (LACTAID) 3000 units tablet Take 3,000-6,000 Units by mouth 3 (three) times daily with meals as needed (lactose intolerance).    Marland Kitchen lamoTRIgine (LAMICTAL) 150 MG tablet Take 150 mg by mouth daily.    Marland Kitchen levocetirizine (XYZAL) 5 MG tablet Take 5 mg by mouth at bedtime.    Marland Kitchen levonorgestrel (MIRENA) 20 MCG/24HR IUD 1 each by Intrauterine route once.    Marland Kitchen OVER THE COUNTER MEDICATION Trigger point injections in the scalp of  decadron , Lidocaine, marcaine -for chronic migraines as needed  Gets very 2 months    . pramipexole (MIRAPEX) 1 MG tablet Take 1.5 mg by mouth at bedtime.    . promethazine (PHENERGAN) 25 MG tablet Take 25 mg by mouth every 6 (six) hours as needed for nausea or vomiting (for migraine headaches).     . Rimegepant Sulfate (NURTEC) 75 MG TBDP Take 75 mg by mouth daily as needed (migraines).    . sertraline (ZOLOFT) 100 MG tablet Take 100 mg by mouth every morning.    . valACYclovir (VALTREX) 1000 MG tablet Take 1,000 mg by mouth daily as needed (outbreaks).    . zonisamide (ZONEGRAN) 100 MG capsule Take 200 mg by mouth daily.  1   No current facility-administered medications on file prior to visit.    ALLERGIES: Other, Codeine, Hydrocodone, Oxycodone, Ultram [tramadol hcl], and Vicodin [hydrocodone-acetaminophen]  Family History  Problem Relation Age of Onset  .  Hypertension Mother   . Hyperlipidemia Mother   . Thyroid disease Mother   . Depression Mother   . Anxiety disorder Mother   . Obesity Mother   . Heart disease Maternal Grandmother   . Prostate cancer Maternal Grandfather   . Stomach cancer Neg Hx   . Rectal cancer Neg Hx   . Colon cancer Neg Hx   . Colon polyps Neg Hx   . Esophageal cancer Neg Hx     Review of Systems  Constitutional: Negative.   HENT: Negative.   Eyes: Negative.   Respiratory: Negative.   Cardiovascular: Negative.   Gastrointestinal: Negative.   Endocrine: Negative.   Genitourinary: Positive for dysuria, frequency and urgency.  Musculoskeletal: Negative.   Skin: Negative.   Allergic/Immunologic: Negative.  Neurological: Negative.   Hematological: Negative.   Psychiatric/Behavioral: Negative.     PHYSICAL EXAMINATION:    BP 116/68   Pulse 70   Temp 99.1 F (37.3 C) (Oral)   Resp 16   Wt 220 lb (99.8 kg)   BMI 36.61 kg/m     General appearance: alert, cooperative, no acute distress NegCVAT  Pelvic: deferred  UA with Calcium Oxalates  Assessment/Plan: Pain with urination - Plan: Urinalysis,Complete w/RFL Culture  Cystitis - Plan: nitrofurantoin, macrocrystal-monohydrate, (MACROBID) 100 MG capsule  Hx of herpes genitalis - Plan: valACYclovir (VALTREX) 500 MG tablet  Calcium oxalate crystals in urine - Discussed risk for kidney stone. Precautions given (written in AVS)  Plan: Repeat Urinalysis in two weeks after treatment of cystitis, reviewed foods to avoid that are high in Oxalates. Pt challenged with food choices related to multiple food intolerances and recent Roux-en-Y surgery. Encouraged to call bariatric surgeon to ask about suggestions. Encouraged to drink increased amount of water.  30 min spent reviewing history, history taking, examining, counseling/education, and documentation.

## 2021-02-18 NOTE — Patient Instructions (Addendum)
Although I do not think you have a kidney stone at this time, I am concerned you are at risk. Please call your Bariatric Surgeon to infomr them you have calcium oxalates in your urine and see if your surgeon has any ideas to reduce your risk. Kidney Stones Kidney stones are rock-like masses that form inside of the kidneys. Kidneys are organs that make pee (urine). A kidney stone may move into other parts of the urinary tract, including:  The tubes that connect the kidneys to the bladder (ureters).  The bladder.  The tube that carries urine out of the body (urethra). Kidney stones can cause very bad pain and can block the flow of pee. The stone usually leaves your body (passes) through your pee. You may need to have a doctor take out the stone. What are the causes? Kidney stones may be caused by:  A condition in which certain glands make too much parathyroid hormone (primary hyperparathyroidism).  A buildup of a type of crystals in the bladder made of a chemical called uric acid. The body makes uric acid when you eat certain foods.  Narrowing (stricture) of one or both of the ureters.  A kidney blockage that you were born with.  Past surgery on the kidney or the ureters, such as gastric bypass surgery. What increases the risk? You are more likely to develop this condition if:  You have had a kidney stone in the past.  You have a family history of kidney stones.  You do not drink enough water.  You eat a diet that is high in protein, salt (sodium), or sugar.  You are overweight or very overweight (obese). What are the signs or symptoms? Symptoms of a kidney stone may include:  Pain in the side of the belly, right below the ribs (flank pain). Pain usually spreads (radiates) to the groin.  Needing to pee often or right away (urgently).  Pain when going pee (urinating).  Blood in your pee (hematuria).  Feeling like you may vomit (nauseous).  Vomiting.  Fever and  chills. How is this treated? Treatment depends on the size, location, and makeup of the kidney stones. The stones will often pass out of the body through peeing. You may need to:  Drink more fluid to help pass the stone. In some cases, you may be given fluids through an IV tube put into one of your veins at the hospital.  Take medicine for pain.  Make changes in your diet to help keep kidney stones from coming back. Sometimes, medical procedures are needed to remove a kidney stone. This may involve:  A procedure to break up kidney stones using a beam of light (laser) or shock waves.  Surgery to remove the kidney stones. Follow these instructions at home: Medicines  Take over-the-counter and prescription medicines only as told by your doctor.  Ask your doctor if the medicine prescribed to you requires you to avoid driving or using heavy machinery. Eating and drinking  Drink enough fluid to keep your pee pale yellow. You may be told to drink at least 8-10 glasses of water each day. This will help you pass the stone.  If told by your doctor, change your diet. This may include: ? Limiting how much salt you eat. ? Eating more fruits and vegetables. ? Limiting how much meat, poultry, fish, and eggs you eat.  Follow instructions from your doctor about eating or drinking restrictions. General instructions  Collect pee samples as told by  your doctor. You may need to collect a pee sample: ? 24 hours after a stone comes out. ? 8-12 weeks after a stone comes out, and every 6-12 months after that.  Strain your pee every time you pee (urinate), for as long as told. Use the strainer that your doctor recommends.  Do not throw out the stone. Keep it so that it can be tested by your doctor.  Keep all follow-up visits as told by your doctor. This is important. You may need follow-up tests. How is this prevented? To prevent another kidney stone:  Drink enough fluid to keep your pee pale  yellow. This is the best way to prevent kidney stones.  Eat healthy foods.  Avoid certain foods as told by your doctor. You may be told to eat less protein.  Stay at a healthy weight.   Where to find more information  [place] ([facility]): www.kidney.[facility]): www.urologyhealth.org Contact a doctor if:  You have pain that gets worse or does not get better with medicine. Get help right away if:  You have a fever or chills.  You get very bad pain.  You get new pain in your belly (abdomen).  You pass out (faint).  You cannot pee. Summary  Kidney stones are rock-like masses that form inside of the kidneys.  Kidney stones can cause very bad pain and can block the flow of pee.  The stones will often pass out of the body through peeing.  Drink enough fluid to keep your pee pale yellow. This information is not intended to replace advice given to you by your health care provider. Make sure you discuss any questions you have with your health care provider. Document Revised: 04/15/2019 Document Reviewed: 04/15/2019 Elsevier Patient Education  2021 [place]. Urinary Tract Infection, Adult A urinary tract infection (UTI) is an infection of any part of the urinary tract. The urinary tract includes:  The kidneys.  The ureters.  The bladder.  The urethra. These organs make, store, and get rid of pee (urine) in the body. What are the causes? This infection is caused by germs (bacteria) in your genital area. These germs grow and cause swelling (inflammation) of your urinary tract. What increases the risk? The following factors may make you more likely to develop this condition:  Using a small, thin tube (catheter) to drain pee.  Not being able to control when you pee or poop (incontinence).  Being female. If you are female, these things can increase the risk: ? Using these methods to prevent pregnancy:  A medicine that kills sperm  (spermicide).  A device that blocks sperm (diaphragm). ? Having low levels of a female hormone (estrogen). ? Being pregnant. You are more likely to develop this condition if:  You have genes that add to your risk.  You are sexually active.  You take antibiotic medicines.  You have trouble peeing because of: ? A prostate that is bigger than normal, if you are female. ? A blockage in the part of your body that drains pee from the bladder. ? A kidney stone. ? A nerve condition that affects your bladder. ? Not getting enough to drink. ? Not peeing often enough.  You have other conditions, such as: ? Diabetes. ? A weak disease-fighting system (immune system). ? Sickle cell disease. ? Gout. ? Injury of the spine. What are the signs or symptoms? Symptoms of this condition include:  Needing to pee right away.  Peeing  small amounts often.  Pain or burning when peeing.  Blood in the pee.  Pee that smells bad or not like normal.  Trouble peeing.  Pee that is cloudy.  Fluid coming from the vagina, if you are female.  Pain in the belly or lower back. Other symptoms include:  Vomiting.  Not feeling hungry.  Feeling mixed up (confused). This may be the first symptom in older adults.  Being tired and grouchy (irritable).  A fever.  Watery poop (diarrhea). How is this treated?  Taking antibiotic medicine.  Taking other medicines.  Drinking enough water. In some cases, you may need to see a specialist. Follow these instructions at home: Medicines  Take over-the-counter and prescription medicines only as told by your doctor.  If you were prescribed an antibiotic medicine, take it as told by your doctor. Do not stop taking it even if you start to feel better. General instructions  Make sure you: ? Pee until your bladder is empty. ? Do not hold pee for a long time. ? Empty your bladder after sex. ? Wipe from front to back after peeing or pooping if you are a  female. Use each tissue one time when you wipe.  Drink enough fluid to keep your pee pale yellow.  Keep all follow-up visits.   Contact a doctor if:  You do not get better after 1-2 days.  Your symptoms go away and then come back. Get help right away if:  You have very bad back pain.  You have very bad pain in your lower belly.  You have a fever.  You have chills.  You feeling like you will vomit or you vomit. Summary  A urinary tract infection (UTI) is an infection of any part of the urinary tract.  This condition is caused by germs in your genital area.  There are many risk factors for a UTI.  Treatment includes antibiotic medicines.  Drink enough fluid to keep your pee pale yellow. This information is not intended to replace advice given to you by your health care provider. Make sure you discuss any questions you have with your health care provider. Document Revised: 07/09/2020 Document Reviewed: 07/09/2020 Elsevier Patient Education  [place].

## 2021-02-19 ENCOUNTER — Encounter: Payer: Self-pay | Admitting: Nurse Practitioner

## 2021-02-20 LAB — URINALYSIS, COMPLETE W/RFL CULTURE: Hyaline Cast: NONE SEEN /LPF

## 2021-02-20 LAB — URINE CULTURE
MICRO NUMBER:: 11637519
SPECIMEN QUALITY:: ADEQUATE

## 2021-02-20 LAB — CULTURE INDICATED

## 2021-02-22 ENCOUNTER — Other Ambulatory Visit: Payer: Self-pay

## 2021-02-22 ENCOUNTER — Other Ambulatory Visit: Payer: Self-pay | Admitting: Nurse Practitioner

## 2021-02-22 ENCOUNTER — Other Ambulatory Visit (INDEPENDENT_AMBULATORY_CARE_PROVIDER_SITE_OTHER): Payer: Medicare HMO

## 2021-02-22 DIAGNOSIS — R82998 Other abnormal findings in urine: Secondary | ICD-10-CM | POA: Diagnosis not present

## 2021-02-22 DIAGNOSIS — Z1329 Encounter for screening for other suspected endocrine disorder: Secondary | ICD-10-CM | POA: Diagnosis not present

## 2021-02-22 NOTE — Progress Notes (Signed)
Discussed risks of calcium oxalates. Pt will come in today for CMP and TSH. If kidney function normal, will refer to urology, if kidney function abnormal, will refer to nephrology

## 2021-02-23 LAB — COMPREHENSIVE METABOLIC PANEL
AG Ratio: 1.4 (calc) (ref 1.0–2.5)
ALT: 25 U/L (ref 6–29)
AST: 22 U/L (ref 10–35)
Albumin: 3.9 g/dL (ref 3.6–5.1)
Alkaline phosphatase (APISO): 110 U/L (ref 31–125)
BUN: 15 mg/dL (ref 7–25)
CO2: 23 mmol/L (ref 20–32)
Calcium: 9.5 mg/dL (ref 8.6–10.2)
Chloride: 106 mmol/L (ref 98–110)
Creat: 0.68 mg/dL (ref 0.50–1.10)
Globulin: 2.8 g/dL (calc) (ref 1.9–3.7)
Glucose, Bld: 64 mg/dL — ABNORMAL LOW (ref 65–99)
Potassium: 4 mmol/L (ref 3.5–5.3)
Sodium: 138 mmol/L (ref 135–146)
Total Bilirubin: 0.3 mg/dL (ref 0.2–1.2)
Total Protein: 6.7 g/dL (ref 6.1–8.1)

## 2021-02-23 LAB — URINALYSIS, COMPLETE
Bilirubin Urine: NEGATIVE
Glucose, UA: NEGATIVE
Hyaline Cast: NONE SEEN /LPF
Leukocytes,Ua: NEGATIVE
Nitrite: POSITIVE — AB
Protein, ur: NEGATIVE
Specific Gravity, Urine: 1.02 (ref 1.001–1.03)
pH: 7.5 (ref 5.0–8.0)

## 2021-02-23 LAB — EXTRA URINE SPECIMEN

## 2021-02-24 ENCOUNTER — Telehealth: Payer: Self-pay | Admitting: Nurse Practitioner

## 2021-02-24 DIAGNOSIS — R350 Frequency of micturition: Secondary | ICD-10-CM | POA: Diagnosis not present

## 2021-02-24 DIAGNOSIS — Z4802 Encounter for removal of sutures: Secondary | ICD-10-CM | POA: Diagnosis not present

## 2021-02-24 DIAGNOSIS — R300 Dysuria: Secondary | ICD-10-CM | POA: Diagnosis not present

## 2021-02-24 DIAGNOSIS — R102 Pelvic and perineal pain: Secondary | ICD-10-CM | POA: Diagnosis not present

## 2021-02-24 DIAGNOSIS — R1084 Generalized abdominal pain: Secondary | ICD-10-CM | POA: Diagnosis not present

## 2021-02-24 LAB — COMPLETE METABOLIC PANEL WITH GFR
AG Ratio: 1.4 (calc) (ref 1.0–2.5)
ALT: 25 U/L (ref 6–29)
AST: 22 U/L (ref 10–35)
Albumin: 3.9 g/dL (ref 3.6–5.1)
Alkaline phosphatase (APISO): 110 U/L (ref 31–125)
BUN: 15 mg/dL (ref 7–25)
CO2: 23 mmol/L (ref 20–32)
Calcium: 9.5 mg/dL (ref 8.6–10.2)
Chloride: 106 mmol/L (ref 98–110)
Creat: 0.68 mg/dL (ref 0.50–1.10)
GFR, Est African American: 122 mL/min/{1.73_m2} (ref >=60)
GFR, Est Non African American: 106 mL/min/{1.73_m2} (ref >=60)
Globulin: 2.8 g/dL (calc) (ref 1.9–3.7)
Glucose, Bld: 64 mg/dL — ABNORMAL LOW (ref 65–99)
Potassium: 4 mmol/L (ref 3.5–5.3)
Sodium: 138 mmol/L (ref 135–146)
Total Bilirubin: 0.3 mg/dL (ref 0.2–1.2)
Total Protein: 6.7 g/dL (ref 6.1–8.1)

## 2021-02-24 LAB — TSH: TSH: 1.79 mIU/L

## 2021-02-24 NOTE — Telephone Encounter (Signed)
Routing to [employer] triage pool.

## 2021-02-24 NOTE — Telephone Encounter (Addendum)
Office notes faxed to [facility] [phone] to Attention: [place] per Karma Ganja NP request.

## 2021-02-24 NOTE — Telephone Encounter (Signed)
Call to [facility] regarding patient and symptoms: pain with urination and the presence of calcium crystals in urine. Pt has history of kidney stone. She also has malabsorptive issues with recent gastric bypass.  Inquired if more appropriate to refer to urology vs nephrology. BUN/Creat WNL, awaiting GFR results. Spoke with Jenny Reichmann, RN who stated that Urology would be appropriate and would get an appointment scheduled for her.   She asked if records could be faxed. Please include: labs and last office visit.

## 2021-02-25 ENCOUNTER — Other Ambulatory Visit: Payer: Self-pay

## 2021-02-25 ENCOUNTER — Other Ambulatory Visit (HOSPITAL_COMMUNITY): Payer: Self-pay | Admitting: Urology

## 2021-02-25 ENCOUNTER — Other Ambulatory Visit: Payer: Self-pay | Admitting: Urology

## 2021-02-25 ENCOUNTER — Ambulatory Visit (HOSPITAL_BASED_OUTPATIENT_CLINIC_OR_DEPARTMENT_OTHER)
Admission: RE | Admit: 2021-02-25 | Discharge: 2021-02-25 | Disposition: A | Discharge status: Home / Self Care | Payer: Medicare HMO | Source: Ambulatory Visit | Attending: Urology | Admitting: Urology

## 2021-02-25 DIAGNOSIS — R1084 Generalized abdominal pain: Secondary | ICD-10-CM | POA: Diagnosis not present

## 2021-02-25 DIAGNOSIS — R109 Unspecified abdominal pain: Secondary | ICD-10-CM

## 2021-02-25 DIAGNOSIS — R309 Painful micturition, unspecified: Secondary | ICD-10-CM | POA: Diagnosis not present

## 2021-03-03 ENCOUNTER — Other Ambulatory Visit: Payer: Medicare HMO

## 2021-03-05 IMAGING — RF DG UGI W SINGLE CM
11 of 14 series · 14 of 24 positions shown · non-contrast
Comparison: None.

CLINICAL DATA: Preoperative evaluation bariatric surgery

EXAM:
UPPER GI SERIES WITH KUB
TECHNIQUE: After obtaining a scout radiograph a routine upper GI series was
performed using thin and high density barium.
FLUOROSCOPY TIME:  Fluoroscopy Time:  48 seconds
Radiation Exposure Index (if provided by the fluoroscopic device):
28.2 mGy

[Series 1: t abdomen supine · 0.15mm/px · 1 of 1 slices shown]
[im 1/1]
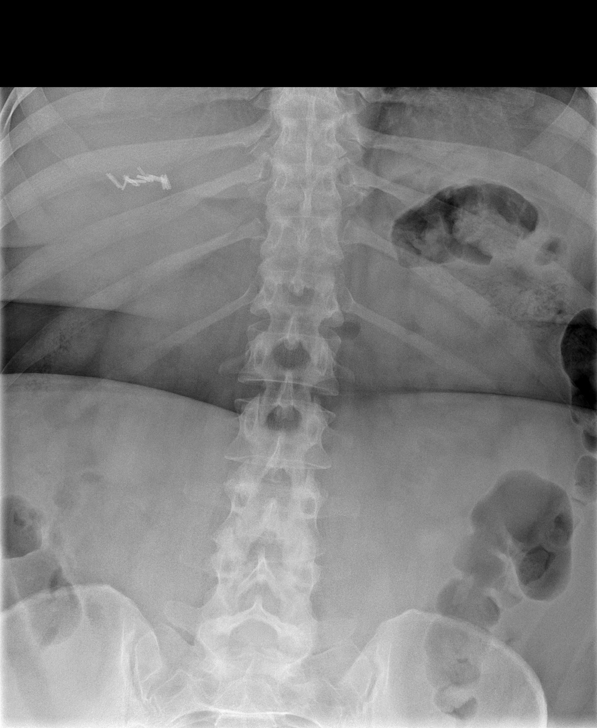

[Series 2: cp_standard · 0.51mm/px · 1 of 4 frames shown (1 of 8)]
[frame 3/4]
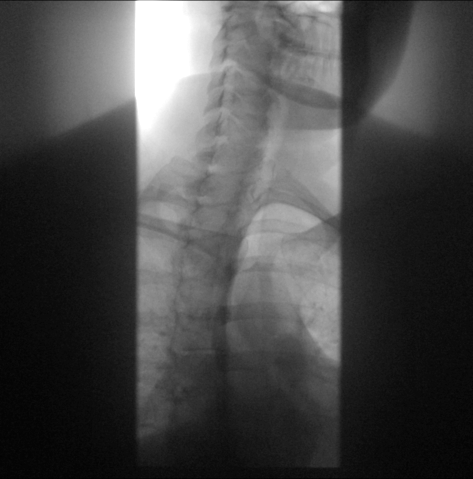

[Series 3: cp_standard · 0.51mm/px · 3 of 164 frames shown (2 of 8)]
[frame 25/164]
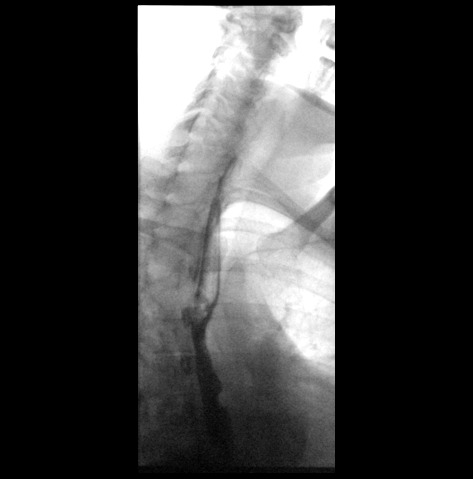
[frame 83/164]
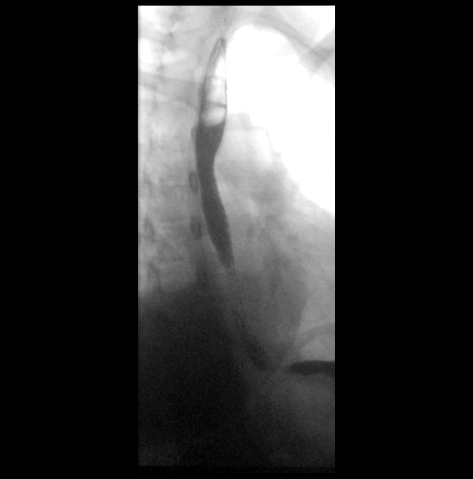
[frame 140/164]
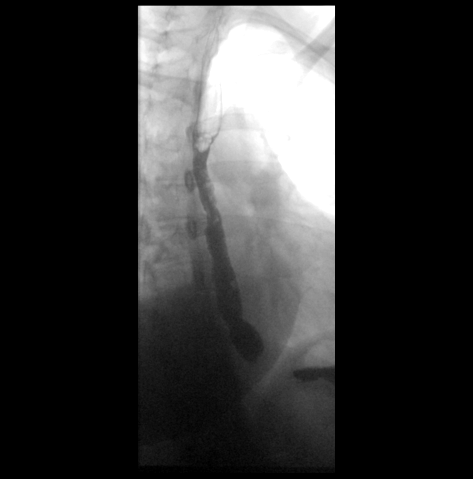

[Series 4: cp_standard · 0.51mm/px · 2 of 70 frames shown (3 of 8)]
[frame 36/70]
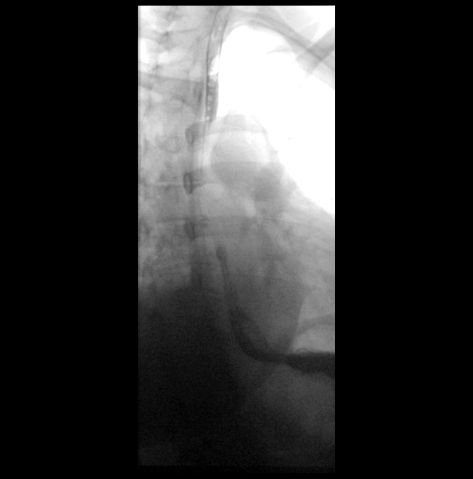
[frame 60/70]
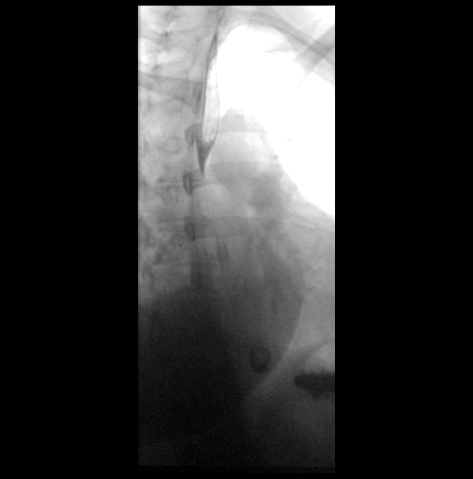

[Series 5: cp_standard · 0.26mm/px · 1 of 1 slices shown (4 of 8)]
[im 1/1]
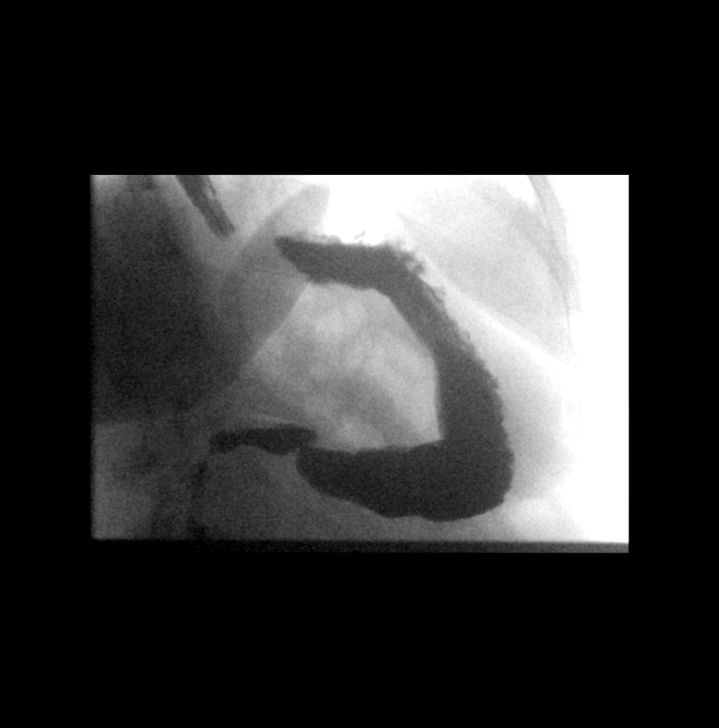

[Series 7: cp_standard · 0.26mm/px · 1 of 1 slices shown (5 of 8)]
[im 1/1]
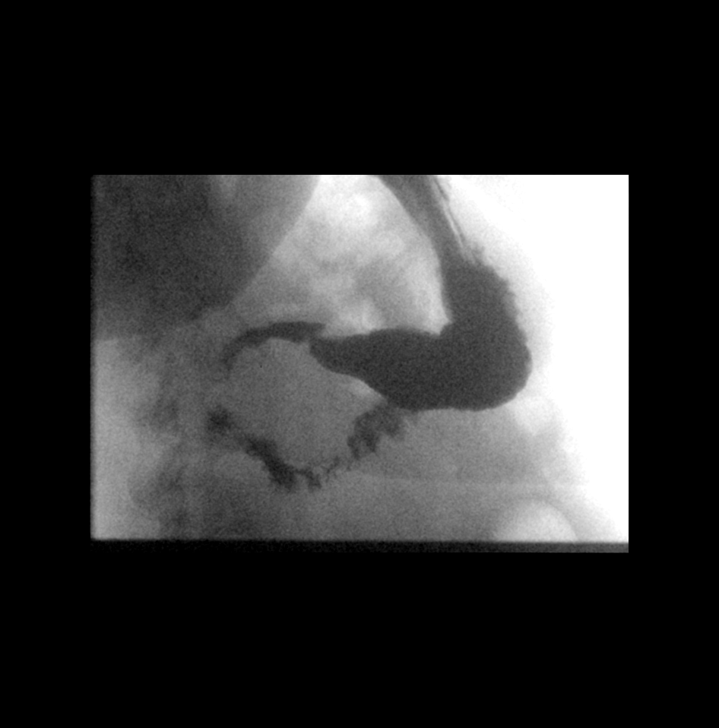

[Series 8: fluoro_barium 2fps_bw · 0.18mm/px · 1 of 2 frames shown (1 of 2)]
[frame 2/2]
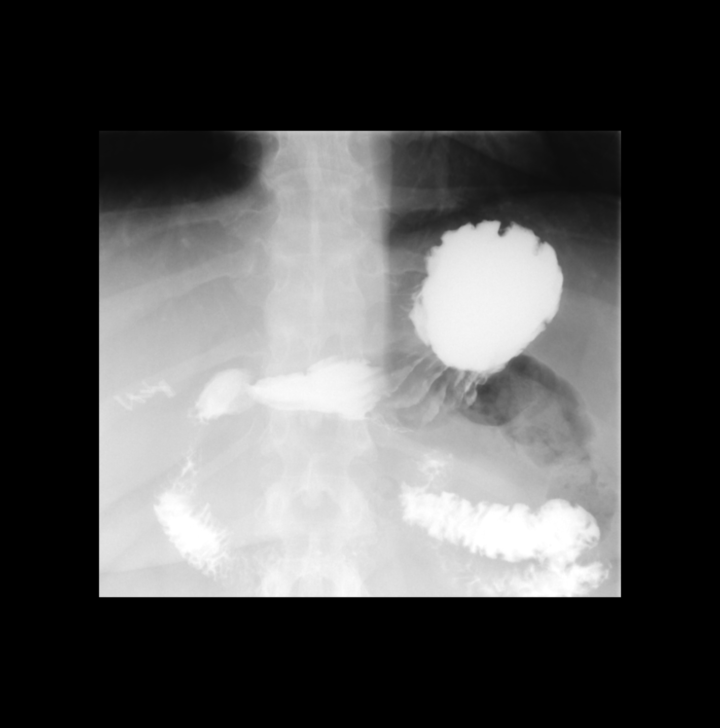

[Series 9: fluoro_barium 2fps_bw · 0.19mm/px · 1 of 2 frames shown (2 of 2)]
[frame 2/2]
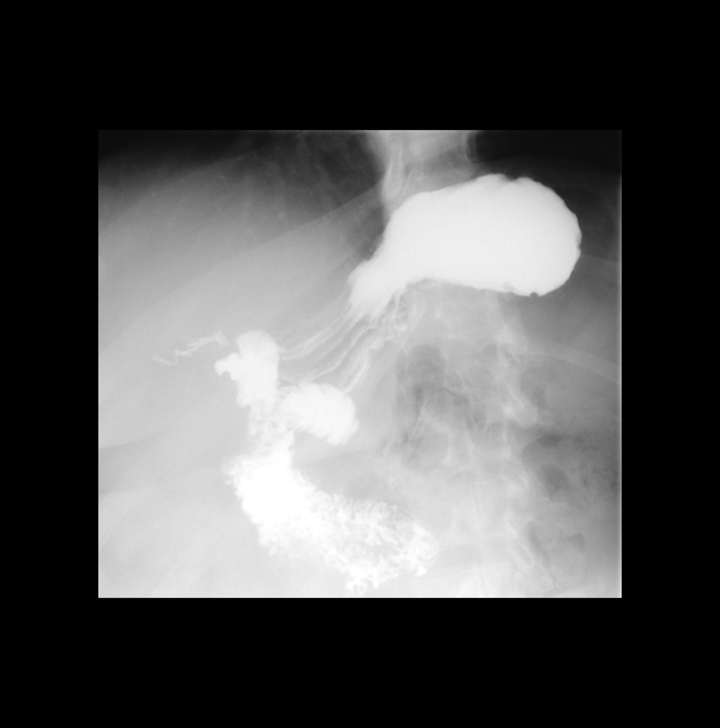

[Series 10: cp_standard · 0.28mm/px · 1 of 1 slices shown (6 of 8)]
[im 1/1]
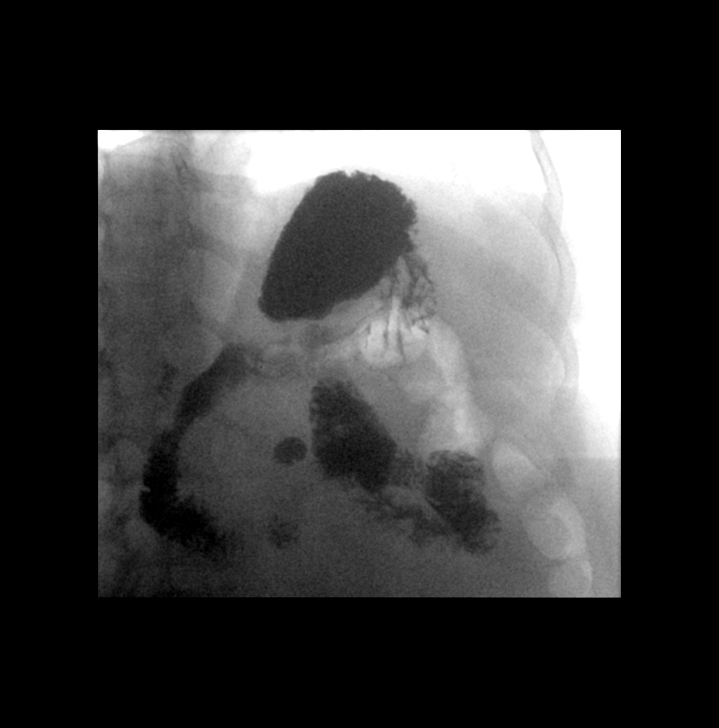

[Series 12: cp_standard · 0.28mm/px · 1 of 1 slices shown (7 of 8)]
[im 1/1]
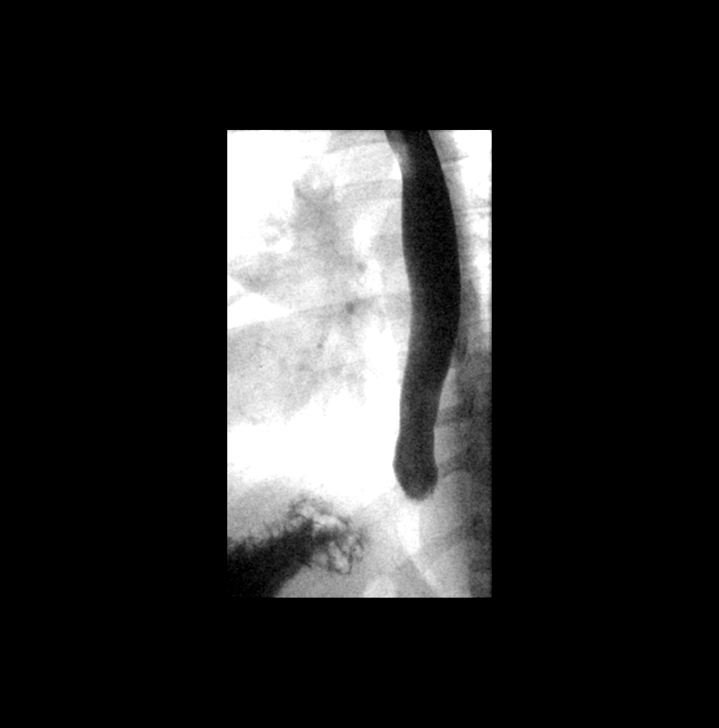

[Series 14: cp_standard · 0.28mm/px · 1 of 1 slices shown (8 of 8)]
[im 1/1]
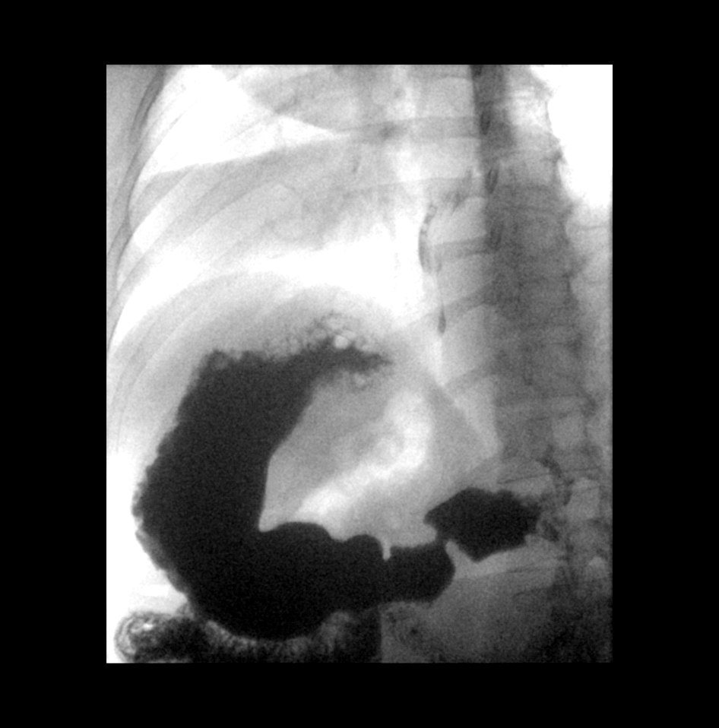

[14 of 24 positions shown; findings below may reference images not displayed]

FINDINGS: Scout radiograph of the abdomen demonstrates unremarkable bowel gas
pattern. Cholecystectomy clips are present.

The patient swallowed barium without difficulty. There is no
esophageal mass, stricture, or dysmotility. There is no hiatal
hernia. The stomach, duodenum, and visualized proximal jejunum
demonstrate an unremarkable single contrast appearance. There is no
spontaneous gastroesophageal reflux.
IMPRESSION: No significant abnormality.

## 2021-03-09 DIAGNOSIS — M545 Low back pain, unspecified: Secondary | ICD-10-CM | POA: Diagnosis not present

## 2021-03-09 DIAGNOSIS — M25551 Pain in right hip: Secondary | ICD-10-CM | POA: Diagnosis not present

## 2021-03-09 DIAGNOSIS — M5136 Other intervertebral disc degeneration, lumbar region: Secondary | ICD-10-CM | POA: Diagnosis not present

## 2021-04-06 DIAGNOSIS — G518 Other disorders of facial nerve: Secondary | ICD-10-CM | POA: Diagnosis not present

## 2021-04-06 DIAGNOSIS — M791 Myalgia, unspecified site: Secondary | ICD-10-CM | POA: Diagnosis not present

## 2021-04-06 DIAGNOSIS — M542 Cervicalgia: Secondary | ICD-10-CM | POA: Diagnosis not present

## 2021-04-06 DIAGNOSIS — G43719 Chronic migraine without aura, intractable, without status migrainosus: Secondary | ICD-10-CM | POA: Diagnosis not present

## 2021-04-08 DIAGNOSIS — J301 Allergic rhinitis due to pollen: Secondary | ICD-10-CM | POA: Diagnosis not present

## 2021-04-08 DIAGNOSIS — J3081 Allergic rhinitis due to animal (cat) (dog) hair and dander: Secondary | ICD-10-CM | POA: Diagnosis not present

## 2021-04-08 DIAGNOSIS — T781XXD Other adverse food reactions, not elsewhere classified, subsequent encounter: Secondary | ICD-10-CM | POA: Diagnosis not present

## 2021-04-08 DIAGNOSIS — J3089 Other allergic rhinitis: Secondary | ICD-10-CM | POA: Diagnosis not present

## 2021-04-27 DIAGNOSIS — F900 Attention-deficit hyperactivity disorder, predominantly inattentive type: Secondary | ICD-10-CM | POA: Diagnosis not present

## 2021-04-27 DIAGNOSIS — F429 Obsessive-compulsive disorder, unspecified: Secondary | ICD-10-CM | POA: Diagnosis not present

## 2021-04-27 DIAGNOSIS — R69 Illness, unspecified: Secondary | ICD-10-CM | POA: Diagnosis not present

## 2021-04-27 DIAGNOSIS — Z79891 Long term (current) use of opiate analgesic: Secondary | ICD-10-CM | POA: Diagnosis not present

## 2021-05-03 ENCOUNTER — Ambulatory Visit: Payer: Medicare HMO | Admitting: Obstetrics & Gynecology

## 2021-05-10 DIAGNOSIS — M791 Myalgia, unspecified site: Secondary | ICD-10-CM | POA: Diagnosis not present

## 2021-05-10 DIAGNOSIS — G43719 Chronic migraine without aura, intractable, without status migrainosus: Secondary | ICD-10-CM | POA: Diagnosis not present

## 2021-05-10 DIAGNOSIS — M542 Cervicalgia: Secondary | ICD-10-CM | POA: Diagnosis not present

## 2021-05-10 DIAGNOSIS — G518 Other disorders of facial nerve: Secondary | ICD-10-CM | POA: Diagnosis not present

## 2021-06-20 ENCOUNTER — Other Ambulatory Visit: Payer: Self-pay

## 2021-06-20 ENCOUNTER — Ambulatory Visit (INDEPENDENT_AMBULATORY_CARE_PROVIDER_SITE_OTHER): Payer: Medicare HMO | Admitting: Obstetrics & Gynecology

## 2021-06-20 ENCOUNTER — Encounter: Payer: Self-pay | Admitting: Obstetrics & Gynecology

## 2021-06-20 VITALS — BP 132/86 | HR 78 | Resp 14 | Ht 64.0 in | Wt 176.0 lb

## 2021-06-20 DIAGNOSIS — Z30431 Encounter for routine checking of intrauterine contraceptive device: Secondary | ICD-10-CM | POA: Diagnosis not present

## 2021-06-20 DIAGNOSIS — N3010 Interstitial cystitis (chronic) without hematuria: Secondary | ICD-10-CM

## 2021-06-20 DIAGNOSIS — R3915 Urgency of urination: Secondary | ICD-10-CM

## 2021-06-20 DIAGNOSIS — Z9884 Bariatric surgery status: Secondary | ICD-10-CM | POA: Diagnosis not present

## 2021-06-20 DIAGNOSIS — Z01419 Encounter for gynecological examination (general) (routine) without abnormal findings: Secondary | ICD-10-CM

## 2021-06-20 NOTE — Progress Notes (Addendum)
Gloria Rodriguez [birth year] [id number]   History:    45 y.o. Remarried x 10 yrs.  G2P2L2  Daughter 60+ yo, son 90+ yo.   RP:  Established patient for annual gyn exam   HPI:  Mirena IUD x 07/2020.  Menses light normal.  No pelvic pain.  Rarely sexually active.  Breasts wnl.  Roux en Y Gastric Bypass 11/29/2020.  Lost 80-90 Lbs since surgery.  Current BMI 30.21. GERD improved.  Many food intolerances.     Past medical history,surgical history, family history and social history were all reviewed and documented in the EPIC chart.  Gynecologic History No LMP recorded. (Menstrual status: IUD).  Obstetric History OB History  Gravida Para Term Preterm AB Living  2 2 2  0 0 2  SAB IAB Ectopic Multiple Live Births  0 0 0 0 2    # Outcome Date GA Lbr Len/2nd Weight Sex Delivery Anes PTL Lv  2 Term 07/11/11 [redacted]w[redacted]d  5 lb 3.4 oz (2.365 kg) F CS-LTranv EPI  LIV  1 Term 01/2001 [redacted]w[redacted]d  7 lb 13 oz (3.544 kg) F CS-LTranv Spinal N LIV     Birth Comments: c/s for HSV outbreak     ROS: A ROS was performed and pertinent positives and negatives are included in the history.  GENERAL: No fevers or chills. HEENT: No change in vision, no earache, sore throat or sinus congestion. NECK: No pain or stiffness. CARDIOVASCULAR: No chest pain or pressure. No palpitations. PULMONARY: No shortness of breath, cough or wheeze. GASTROINTESTINAL: No abdominal pain, nausea, vomiting or diarrhea, melena or bright red blood per rectum. GENITOURINARY: No urinary frequency, urgency, hesitancy or dysuria. MUSCULOSKELETAL: No joint or muscle pain, no back pain, no recent trauma. DERMATOLOGIC: No rash, no itching, no lesions. ENDOCRINE: No polyuria, polydipsia, no heat or cold intolerance. No recent change in weight. HEMATOLOGICAL: No anemia or easy bruising or bleeding. NEUROLOGIC: No headache, seizures, numbness, tingling or weakness. PSYCHIATRIC: No depression, no loss of interest in normal activity or change in sleep pattern.      Exam:   BP 132/86 (BP Location: Right Arm, Patient Position: Sitting, Cuff Size: Normal)   Pulse 78   Resp 14   Ht 5\' 4"  (1.626 m)   Wt 176 lb (79.8 kg)   BMI 30.21 kg/m   Body mass index is 30.21 kg/m.  General appearance : Well developed well nourished female. No acute distress HEENT: Eyes: no retinal hemorrhage or exudates,  Neck supple, trachea midline, no carotid bruits, no thyroidmegaly Lungs: Clear to auscultation, no rhonchi or wheezes, or rib retractions  Heart: Regular rate and rhythm, no murmurs or gallops Breast:Examined in sitting and supine position were symmetrical in appearance, no palpable masses or tenderness,  no skin retraction, no nipple inversion, no nipple discharge, no skin discoloration, no axillary or supraclavicular lymphadenopathy Abdomen: no palpable masses or tenderness, no rebound or guarding Extremities: no edema or skin discoloration or tenderness  Pelvic: Vulva: Normal             Vagina: No gross lesions or discharge  Cervix: No gross lesions or discharge.  IUD strings felt at the [facility].  Uterus  AV, normal size, shape and consistency, non-tender and mobile  Adnexa  Without masses or tenderness  Anus: Normal  U/A:  Cloudy yellow, Nitrites Neg, Protein Neg, WBC 0-5, RBC Neg, Bacteria few.  Moderate Ca++ Carbonate.  Pending U. Culture.   Assessment/Plan:  45 y.o. female for annual exam  1. Well female exam with routine gynecological exam Normal gynecologic exam.  Last Pap test March 2021 was negative, no indication to repeat this year.  Breast exam normal.  Overdue for screening mammogram, will schedule now.  Health labs with family physician.  2. IUD check up Mirena IUD since August 2021 well-tolerated and in good position.  3. S/P gastric bypass No complication.  Significant weight loss since surgery in December 2021.  Patient lost 80 to 90 pounds.  Current body mass index is 30.21.  4. Urgency of urination No evidence of acute cystitis  on urine analysis.  Pending urine culture. - Urinalysis,Complete w/RFL Culture  5. IC (interstitial cystitis) Will follow up with urology as needed.  Other orders - EPINEPHrine 0.3 mg/0.3 mL IJ SOAJ injection; Inject into the muscle as needed. - Urine Culture - REFLEXIVE URINE CULTURE   Princess Bruins MD, 2:31 PM 06/20/2021

## 2021-06-21 DIAGNOSIS — M791 Myalgia, unspecified site: Secondary | ICD-10-CM | POA: Diagnosis not present

## 2021-06-21 DIAGNOSIS — M542 Cervicalgia: Secondary | ICD-10-CM | POA: Diagnosis not present

## 2021-06-21 DIAGNOSIS — G43719 Chronic migraine without aura, intractable, without status migrainosus: Secondary | ICD-10-CM | POA: Diagnosis not present

## 2021-06-21 DIAGNOSIS — G518 Other disorders of facial nerve: Secondary | ICD-10-CM | POA: Diagnosis not present

## 2021-06-22 LAB — URINALYSIS, COMPLETE W/RFL CULTURE
Bilirubin Urine: NEGATIVE
Glucose, UA: NEGATIVE
Hgb urine dipstick: NEGATIVE
Hyaline Cast: NONE SEEN /LPF
Ketones, ur: NEGATIVE
Leukocyte Esterase: NEGATIVE
Nitrites, Initial: NEGATIVE
Protein, ur: NEGATIVE
RBC / HPF: NONE SEEN /HPF (ref 0–2)
Specific Gravity, Urine: 1.015 (ref 1.001–1.035)
pH: 6 (ref 5.0–8.0)

## 2021-06-22 LAB — URINE CULTURE
MICRO NUMBER:: 12103143
SPECIMEN QUALITY:: ADEQUATE

## 2021-06-22 LAB — CULTURE INDICATED

## 2021-06-28 DIAGNOSIS — Z09 Encounter for follow-up examination after completed treatment for conditions other than malignant neoplasm: Secondary | ICD-10-CM | POA: Diagnosis not present

## 2021-08-04 DIAGNOSIS — F429 Obsessive-compulsive disorder, unspecified: Secondary | ICD-10-CM | POA: Diagnosis not present

## 2021-08-04 DIAGNOSIS — F900 Attention-deficit hyperactivity disorder, predominantly inattentive type: Secondary | ICD-10-CM | POA: Diagnosis not present

## 2021-08-04 DIAGNOSIS — R69 Illness, unspecified: Secondary | ICD-10-CM | POA: Diagnosis not present

## 2021-09-09 DIAGNOSIS — R69 Illness, unspecified: Secondary | ICD-10-CM | POA: Diagnosis not present

## 2021-09-09 DIAGNOSIS — F900 Attention-deficit hyperactivity disorder, predominantly inattentive type: Secondary | ICD-10-CM | POA: Diagnosis not present

## 2021-09-09 DIAGNOSIS — F3181 Bipolar II disorder: Secondary | ICD-10-CM | POA: Diagnosis not present

## 2021-09-09 DIAGNOSIS — F429 Obsessive-compulsive disorder, unspecified: Secondary | ICD-10-CM | POA: Diagnosis not present

## 2021-09-10 DIAGNOSIS — F3181 Bipolar II disorder: Secondary | ICD-10-CM | POA: Diagnosis not present

## 2021-09-10 DIAGNOSIS — F900 Attention-deficit hyperactivity disorder, predominantly inattentive type: Secondary | ICD-10-CM | POA: Diagnosis not present

## 2021-09-10 DIAGNOSIS — F429 Obsessive-compulsive disorder, unspecified: Secondary | ICD-10-CM | POA: Diagnosis not present

## 2021-09-10 DIAGNOSIS — R69 Illness, unspecified: Secondary | ICD-10-CM | POA: Diagnosis not present

## 2021-09-20 DIAGNOSIS — G43719 Chronic migraine without aura, intractable, without status migrainosus: Secondary | ICD-10-CM | POA: Diagnosis not present

## 2021-09-20 DIAGNOSIS — G518 Other disorders of facial nerve: Secondary | ICD-10-CM | POA: Diagnosis not present

## 2021-09-20 DIAGNOSIS — M791 Myalgia, unspecified site: Secondary | ICD-10-CM | POA: Diagnosis not present

## 2021-09-20 DIAGNOSIS — M542 Cervicalgia: Secondary | ICD-10-CM | POA: Diagnosis not present

## 2021-10-04 DIAGNOSIS — R69 Illness, unspecified: Secondary | ICD-10-CM | POA: Diagnosis not present

## 2021-10-04 DIAGNOSIS — F429 Obsessive-compulsive disorder, unspecified: Secondary | ICD-10-CM | POA: Diagnosis not present

## 2021-10-04 DIAGNOSIS — F3181 Bipolar II disorder: Secondary | ICD-10-CM | POA: Diagnosis not present

## 2021-10-04 DIAGNOSIS — F900 Attention-deficit hyperactivity disorder, predominantly inattentive type: Secondary | ICD-10-CM | POA: Diagnosis not present

## 2021-10-25 DIAGNOSIS — F900 Attention-deficit hyperactivity disorder, predominantly inattentive type: Secondary | ICD-10-CM | POA: Diagnosis not present

## 2021-10-25 DIAGNOSIS — F429 Obsessive-compulsive disorder, unspecified: Secondary | ICD-10-CM | POA: Diagnosis not present

## 2021-10-25 DIAGNOSIS — R69 Illness, unspecified: Secondary | ICD-10-CM | POA: Diagnosis not present

## 2021-10-25 DIAGNOSIS — F3181 Bipolar II disorder: Secondary | ICD-10-CM | POA: Diagnosis not present

## 2021-12-08 DIAGNOSIS — F429 Obsessive-compulsive disorder, unspecified: Secondary | ICD-10-CM | POA: Diagnosis not present

## 2021-12-08 DIAGNOSIS — F900 Attention-deficit hyperactivity disorder, predominantly inattentive type: Secondary | ICD-10-CM | POA: Diagnosis not present

## 2021-12-08 DIAGNOSIS — F3181 Bipolar II disorder: Secondary | ICD-10-CM | POA: Diagnosis not present

## 2021-12-08 DIAGNOSIS — R69 Illness, unspecified: Secondary | ICD-10-CM | POA: Diagnosis not present

## 2022-01-02 DIAGNOSIS — G518 Other disorders of facial nerve: Secondary | ICD-10-CM | POA: Diagnosis not present

## 2022-01-02 DIAGNOSIS — M791 Myalgia, unspecified site: Secondary | ICD-10-CM | POA: Diagnosis not present

## 2022-01-02 DIAGNOSIS — G43719 Chronic migraine without aura, intractable, without status migrainosus: Secondary | ICD-10-CM | POA: Diagnosis not present

## 2022-01-02 DIAGNOSIS — M542 Cervicalgia: Secondary | ICD-10-CM | POA: Diagnosis not present

## 2022-01-09 DIAGNOSIS — F3181 Bipolar II disorder: Secondary | ICD-10-CM | POA: Diagnosis not present

## 2022-01-09 DIAGNOSIS — R69 Illness, unspecified: Secondary | ICD-10-CM | POA: Diagnosis not present

## 2022-01-09 DIAGNOSIS — F429 Obsessive-compulsive disorder, unspecified: Secondary | ICD-10-CM | POA: Diagnosis not present

## 2022-01-09 DIAGNOSIS — F900 Attention-deficit hyperactivity disorder, predominantly inattentive type: Secondary | ICD-10-CM | POA: Diagnosis not present

## 2022-02-08 DIAGNOSIS — R69 Illness, unspecified: Secondary | ICD-10-CM | POA: Diagnosis not present

## 2022-02-08 DIAGNOSIS — F429 Obsessive-compulsive disorder, unspecified: Secondary | ICD-10-CM | POA: Diagnosis not present

## 2022-02-08 DIAGNOSIS — F900 Attention-deficit hyperactivity disorder, predominantly inattentive type: Secondary | ICD-10-CM | POA: Diagnosis not present

## 2022-02-08 DIAGNOSIS — F3181 Bipolar II disorder: Secondary | ICD-10-CM | POA: Diagnosis not present

## 2022-03-23 DIAGNOSIS — M542 Cervicalgia: Secondary | ICD-10-CM | POA: Diagnosis not present

## 2022-03-23 DIAGNOSIS — G43719 Chronic migraine without aura, intractable, without status migrainosus: Secondary | ICD-10-CM | POA: Diagnosis not present

## 2022-03-23 DIAGNOSIS — M791 Myalgia, unspecified site: Secondary | ICD-10-CM | POA: Diagnosis not present

## 2022-03-23 DIAGNOSIS — G518 Other disorders of facial nerve: Secondary | ICD-10-CM | POA: Diagnosis not present

## 2022-03-29 DIAGNOSIS — F3181 Bipolar II disorder: Secondary | ICD-10-CM | POA: Diagnosis not present

## 2022-03-29 DIAGNOSIS — F429 Obsessive-compulsive disorder, unspecified: Secondary | ICD-10-CM | POA: Diagnosis not present

## 2022-03-29 DIAGNOSIS — R69 Illness, unspecified: Secondary | ICD-10-CM | POA: Diagnosis not present

## 2022-03-29 DIAGNOSIS — F900 Attention-deficit hyperactivity disorder, predominantly inattentive type: Secondary | ICD-10-CM | POA: Diagnosis not present

## 2022-04-11 DIAGNOSIS — F429 Obsessive-compulsive disorder, unspecified: Secondary | ICD-10-CM | POA: Diagnosis not present

## 2022-04-11 DIAGNOSIS — R69 Illness, unspecified: Secondary | ICD-10-CM | POA: Diagnosis not present

## 2022-04-11 DIAGNOSIS — F900 Attention-deficit hyperactivity disorder, predominantly inattentive type: Secondary | ICD-10-CM | POA: Diagnosis not present

## 2022-04-11 DIAGNOSIS — F3181 Bipolar II disorder: Secondary | ICD-10-CM | POA: Diagnosis not present

## 2022-04-17 DIAGNOSIS — G629 Polyneuropathy, unspecified: Secondary | ICD-10-CM | POA: Diagnosis not present

## 2022-04-17 DIAGNOSIS — G5603 Carpal tunnel syndrome, bilateral upper limbs: Secondary | ICD-10-CM | POA: Diagnosis not present

## 2022-04-28 DIAGNOSIS — F3181 Bipolar II disorder: Secondary | ICD-10-CM | POA: Diagnosis not present

## 2022-04-28 DIAGNOSIS — F900 Attention-deficit hyperactivity disorder, predominantly inattentive type: Secondary | ICD-10-CM | POA: Diagnosis not present

## 2022-04-28 DIAGNOSIS — R69 Illness, unspecified: Secondary | ICD-10-CM | POA: Diagnosis not present

## 2022-04-28 DIAGNOSIS — F429 Obsessive-compulsive disorder, unspecified: Secondary | ICD-10-CM | POA: Diagnosis not present

## 2022-05-02 ENCOUNTER — Ambulatory Visit: Payer: Medicare HMO | Admitting: Obstetrics & Gynecology

## 2022-05-02 ENCOUNTER — Encounter: Payer: Self-pay | Admitting: Obstetrics & Gynecology

## 2022-05-02 VITALS — BP 114/68

## 2022-05-02 DIAGNOSIS — L723 Sebaceous cyst: Secondary | ICD-10-CM

## 2022-05-02 NOTE — Progress Notes (Signed)
    Gloria Rodriguez 1975/12/23 [id number]        46 y.o.  G2P2L2   RP: White bump at the left posterior vulva x 1 week  HPI: White bump at the left posterior vulva x 1 week.  No drainage.  No pain.     OB History  Gravida Para Term Preterm AB Living  '2 2 2 '$ 0 0 2  SAB IAB Ectopic Multiple Live Births  0 0 0 0 2    # Outcome Date GA Lbr Len/2nd Weight Sex Delivery Anes PTL Lv  2 Term 07/11/11 [redacted]w[redacted]d 5 lb 3.4 oz (2.365 kg) F CS-LTranv EPI  LIV  1 Term 01/2001 [id number] lb 13 oz (3.544 kg) F CS-LTranv Spinal N LIV     Birth Comments: c/s for HSV outbreak    Past medical history,surgical history, problem list, medications, allergies, family history and social history were all reviewed and documented in the EPIC chart.   Directed ROS with pertinent positives and negatives documented in the history of present illness/assessment and plan.  Exam:  Vitals:   05/02/22 1003  BP: 114/68   General appearance:  Normal   Gynecologic exam: Vulva:  2 small Sebaceous gland cysts at the left posterior vulva/peri-anal area.   Assessment/Plan:  89.o. G2P2L2   1. Sebaceous cyst White bump at the left posterior vulva x 1 week.  No drainage.  No pain.  On Gyn exam: 2 small Sebaceous gland cysts at the left posterior vulva/peri-anal area.  Counseling on Sebaceous gland cysts.  Patient reassured.  Recommend warm soaking to reopen the glands.  Other orders - ALPRAZolam (XANAX) 0.25 MG tablet; Take 0.125-0.25 mg by mouth daily as needed. - clomiPRAMINE (ANAFRANIL) 50 MG capsule; SMARTSIG:1 Capsule(s) By Mouth Every Evening - sertraline (ZOLOFT) 100 MG tablet; Take by mouth. - AIMOVIG 140 MG/ML SOAJ; SMARTSIG:1 Milligram(s) SUB-Q Once a Month   MaPrincess BruinsD, 10:38 AM 05/02/2022

## 2022-05-11 DIAGNOSIS — D485 Neoplasm of uncertain behavior of skin: Secondary | ICD-10-CM | POA: Diagnosis not present

## 2022-05-11 DIAGNOSIS — M7989 Other specified soft tissue disorders: Secondary | ICD-10-CM | POA: Diagnosis not present

## 2022-05-11 DIAGNOSIS — L910 Hypertrophic scar: Secondary | ICD-10-CM | POA: Diagnosis not present

## 2022-06-21 DIAGNOSIS — G43719 Chronic migraine without aura, intractable, without status migrainosus: Secondary | ICD-10-CM | POA: Diagnosis not present

## 2022-06-21 DIAGNOSIS — M542 Cervicalgia: Secondary | ICD-10-CM | POA: Diagnosis not present

## 2022-06-21 DIAGNOSIS — G518 Other disorders of facial nerve: Secondary | ICD-10-CM | POA: Diagnosis not present

## 2022-06-21 DIAGNOSIS — M791 Myalgia, unspecified site: Secondary | ICD-10-CM | POA: Diagnosis not present

## 2022-06-29 ENCOUNTER — Encounter (HOSPITAL_COMMUNITY): Payer: Self-pay | Admitting: *Deleted

## 2022-06-29 DIAGNOSIS — F429 Obsessive-compulsive disorder, unspecified: Secondary | ICD-10-CM | POA: Diagnosis not present

## 2022-06-29 DIAGNOSIS — F900 Attention-deficit hyperactivity disorder, predominantly inattentive type: Secondary | ICD-10-CM | POA: Diagnosis not present

## 2022-06-29 DIAGNOSIS — F3181 Bipolar II disorder: Secondary | ICD-10-CM | POA: Diagnosis not present

## 2022-06-29 DIAGNOSIS — R69 Illness, unspecified: Secondary | ICD-10-CM | POA: Diagnosis not present

## 2022-07-11 ENCOUNTER — Ambulatory Visit: Payer: Medicare HMO | Admitting: Obstetrics & Gynecology

## 2022-07-19 ENCOUNTER — Other Ambulatory Visit: Payer: Self-pay

## 2022-07-19 ENCOUNTER — Encounter (INDEPENDENT_AMBULATORY_CARE_PROVIDER_SITE_OTHER): Payer: Self-pay

## 2022-07-19 ENCOUNTER — Telehealth: Payer: Self-pay | Admitting: Gastroenterology

## 2022-07-19 DIAGNOSIS — R140 Abdominal distension (gaseous): Secondary | ICD-10-CM

## 2022-07-19 DIAGNOSIS — R1084 Generalized abdominal pain: Secondary | ICD-10-CM

## 2022-07-19 DIAGNOSIS — R197 Diarrhea, unspecified: Secondary | ICD-10-CM

## 2022-07-19 NOTE — Telephone Encounter (Signed)
Spoke with patient regarding MD recommendations. CT has been scheduled for 07/25/22 at 6:00 pm, pt will need to arrive by 5:30 pm and be NPO 4 hours prior. Contrast bottles placed on 2nd floor for patient's husband to pick up at earliest convenience. She has been advised to go to ED if symptoms persist or worsen. Pt verbalized all understanding. She's been placed on waitlist for sooner appointment time.

## 2022-07-19 NOTE — Telephone Encounter (Signed)
PT spouse is calling about wife being in abdominal distress. Having sever stomach cramping and has an upcoming appointment on 9/11 but doesn't feel it will get better by then. Please reach out to advise.

## 2022-07-19 NOTE — Telephone Encounter (Signed)
Patient called in with complaints of constant, cramping generalized abdominal pain (7/10), frequent diarrhea, bloating, nausea, and gas for two weeks. She feels that the pain is worse after meals, so she has had a poor appetite. Not currently on a PPI, states she is unable to tolerate. She has also been experiencing difficulty swallowing that has worsened since her bariatric surgery in 11/2019. She was last seen in [place] in 12/30/19 with [name] and at the time was prescribed hycosamine QID PRN which she currently is taking. She has been scheduled for a follow up with Anderson Malta, [place] on 07/27/22 at 1:30 pm. Pt seeking further recommendations until she can be seen.

## 2022-07-25 ENCOUNTER — Ambulatory Visit (HOSPITAL_COMMUNITY)
Admission: RE | Admit: 2022-07-25 | Discharge: 2022-07-25 | Disposition: A | Discharge status: Home / Self Care | Payer: Medicare HMO | Source: Ambulatory Visit | Attending: Gastroenterology | Admitting: Gastroenterology

## 2022-07-25 DIAGNOSIS — R1084 Generalized abdominal pain: Secondary | ICD-10-CM | POA: Insufficient documentation

## 2022-07-25 DIAGNOSIS — R140 Abdominal distension (gaseous): Secondary | ICD-10-CM | POA: Insufficient documentation

## 2022-07-25 DIAGNOSIS — R109 Unspecified abdominal pain: Secondary | ICD-10-CM | POA: Diagnosis not present

## 2022-07-25 DIAGNOSIS — R197 Diarrhea, unspecified: Secondary | ICD-10-CM | POA: Insufficient documentation

## 2022-07-25 MED ORDER — IOHEXOL 9 MG/ML PO SOLN
1000.0000 mL | Freq: Once | ORAL | Status: AC
  Administered 2022-07-25: 1000 mL via ORAL

## 2022-07-25 MED ORDER — IOHEXOL 300 MG/ML  SOLN
100.0000 mL | Freq: Once | INTRAMUSCULAR | Status: AC | PRN
  Administered 2022-07-25: 100 mL via INTRAVENOUS

## 2022-07-26 ENCOUNTER — Ambulatory Visit: Payer: Medicare HMO | Admitting: Obstetrics & Gynecology

## 2022-07-27 ENCOUNTER — Encounter: Payer: Self-pay | Admitting: Physician Assistant

## 2022-07-27 ENCOUNTER — Ambulatory Visit: Payer: Medicare HMO | Admitting: Physician Assistant

## 2022-07-27 VITALS — BP 118/78 | HR 75 | Ht 64.0 in | Wt 158.0 lb

## 2022-07-27 DIAGNOSIS — R1084 Generalized abdominal pain: Secondary | ICD-10-CM | POA: Diagnosis not present

## 2022-07-27 DIAGNOSIS — R1310 Dysphagia, unspecified: Secondary | ICD-10-CM | POA: Diagnosis not present

## 2022-07-27 DIAGNOSIS — R197 Diarrhea, unspecified: Secondary | ICD-10-CM | POA: Diagnosis not present

## 2022-07-27 MED ORDER — FAMOTIDINE 40 MG PO TABS: 40.0000 mg | ORAL_TABLET | Freq: Two times a day (BID) | ORAL | 5 refills | Status: DC

## 2022-07-27 NOTE — Progress Notes (Signed)
Chief Complaint: Dysphagia and abdominal pain  HPI:    Gloria Rodriguez is a 46 year old female, known to [name], with a past medical history as listed below including anxiety, reflux and multiple others, who was referred to me by Leonard Downing, * for a complaint of dysphagia and abdominal pain.      02/20/2019 colonoscopy was normal.  Repeat recommended in 10 years.  Endoscopy at the same time with LA grade a esophagitis and otherwise normal.  At that time discussed that a source for her diarrhea was not found.  It was recommended that she try Xifaxan 550 3 times daily x14 weeks if she is not feeling better with the higher dose of Colestipol.    07/19/2022 patient called in and described complaint of constant cramping generalized abdominal pain which is a 7/10, frequent diarrhea, bloating, nausea and gas for 2 weeks.  This is worse after meals.  She could not tolerate her PPI.  Discussed increased difficulty swallowing worsened since her bariatric surgery in 11/30/2019.  CT ordered as below.    07/25/2022 CT of the abdomen pelvis with contrast for diarrhea, abdominal pain and bloating.  With postsurgical changes of the stomach and uterine fibroids and otherwise normal.    Today, the patient presents to clinic and tells me that about 2-1/2 weeks ago she started with terrible cramping on both sides of her abdomen which was worse anytime she would eat and would eventually result in watery diarrhea and when she felt completely emptied out then it kind of went away but anytime she ate again it would hurt.  When she went to the bathroom and felt like her kidneys hurt and the sides of her abdomen hurt and she had constant diarrhea.  She denies any fever or chills but does tell me her daughter had a stomach virus around the time that all of this started but hers only lasted a day or 2.  Now over the past 2 days patient is back to her regular diet and actually has been running a little constipated, she passed  a small piece of hard stool yesterday and has had no further abdominal pain.    More concerning to her today since she is feeling better is that she has had increase in trouble swallowing.  Describes she had bariatric surgery back in December 2021 with gastric bypass and since then has had trouble swallowing which seems to worsen.  It is hard for her to swallow pills as well as sometimes her food and she feels like she is growing increasingly hoarse.  Tells me she cannot take PPIs because she has diarrhea nonstop.    Denies fever, chills, weight loss or blood in her stool.   Past Medical History:  Diagnosis Date   ADD (attention deficit disorder)    Allergy    allergy meds daily    Anxiety    hx bipolar, ADHD   Arthritis    left shoulder    Bruxism    Carpal tunnel syndrome    Chronic migraine    Depression    states well controlled   Diarrhea    Dyspnea    GERD (gastroesophageal reflux disease)    hx gallstones and pancreatitis per pt   Gluten intolerance    Headache(784.0)    History of kidney stones    HSV infection    on Valtrex suppression   Hypotension    normal rens 80/60   Interstitial cystitis    Lactose  intolerance    No pertinent past medical history    states with gall bladder attacks with chest pain, vomiting and diarrhes    EKG  in EPIC   OCD (obsessive compulsive disorder)    Periodic limb movement    Postpartum care and examination of lactating mother 07/13/2011   Sleep disorder 03/03/2014   SOB (shortness of breath)    Swelling     Past Surgical History:  Procedure Laterality Date   68 HOUR [place] STUDY N/A 10/15/2019   Procedure: [place] STUDY;  Surgeon: [name], MD;  Location: [facility];  Service: Gastroenterology;  Laterality: N/A;   CESAREAN SECTION     CESAREAN SECTION  07/11/2011   Procedure: CESAREAN SECTION;  Surgeon: Princess Bruins;  Location: [facility];  Service: Gynecology;  Laterality: N/A;  Repeat   CHOLECYSTECTOMY  11/14/2011    Procedure: LAPAROSCOPIC CHOLECYSTECTOMY WITH INTRAOPERATIVE CHOLANGIOGRAM;  Surgeon: Joyice Faster. Cornett, MD;  Location: [facility];  Service: General;  Laterality: N/A;  Laparoscopic Cholecystectomy with cholangiogram, c-arm   COLONOSCOPY     ESOPHAGEAL MANOMETRY N/A 10/15/2019   Procedure: ESOPHAGEAL MANOMETRY (EM);  Surgeon: [name], MD;  Location: [facility];  Service: Gastroenterology;  Laterality: N/A;   GASTRIC ROUX-EN-Y N/A 11/29/2020   Procedure: LAPAROSCOPIC ROUX-EN-Y GASTRIC BYPASS WITH UPPER ENDOSCOPY;  Surgeon: Clovis Riley, MD;  Location: [facility];  Service: General;  Laterality: N/A;   LASIK     2007   mirena     insertion 08-09-2020   TEE WITHOUT CARDIOVERSION N/A 11/18/2013   Procedure: TRANSESOPHAGEAL ECHOCARDIOGRAM (TEE);  Surgeon: Laverda Page, MD;  Location: [place];  Service: Cardiovascular;  Laterality: N/A;   UPPER GI ENDOSCOPY N/A 11/29/2020   Procedure: UPPER GI ENDOSCOPY;  Surgeon: Clovis Riley, MD;  Location: [facility];  Service: General;  Laterality: N/A;   WISDOM TOOTH EXTRACTION      Current Outpatient Medications  Medication Sig Dispense Refill   AIMOVIG 140 MG/ML SOAJ SMARTSIG:1 Milligram(s) SUB-Q Once a Month     ALPRAZolam (XANAX) 0.25 MG tablet Take 0.125-0.25 mg by mouth daily as needed.     amphetamine-dextroamphetamine (ADDERALL XR) 30 MG 24 hr capsule Take 30 mg by mouth daily with breakfast.      amphetamine-dextroamphetamine (ADDERALL) 10 MG tablet Take 10 mg by mouth daily with breakfast.     clomiPRAMINE (ANAFRANIL) 50 MG capsule SMARTSIG:1 Capsule(s) By Mouth Every Evening     EPINEPHrine 0.3 mg/0.3 mL IJ SOAJ injection Inject into the muscle as needed. (Patient not taking: Reported on 05/02/2022)     hyoscyamine (LEVSIN SL) 0.125 MG SL tablet TAKE 1 TABLET (0.125 MG TOTAL) BY MOUTH EVERY 6 (SIX) HOURS AS NEEDED. *NOT COVERED (Patient taking differently: Take 0.125 mg by mouth every 6 (six) hours as needed for cramping.) 120  tablet 1   lactase (LACTAID) 3000 units tablet Take 3,000-6,000 Units by mouth 3 (three) times daily with meals as needed (lactose intolerance).     lamoTRIgine (LAMICTAL) 150 MG tablet Take 150 mg by mouth daily.     levocetirizine (XYZAL) 5 MG tablet Take 5 mg by mouth at bedtime.     levonorgestrel (MIRENA) 20 MCG/24HR IUD 1 each by Intrauterine route once.     OVER THE COUNTER MEDICATION Trigger point injections in the scalp of  decadron , Lidocaine, marcaine -for chronic migraines as needed  Gets very 2 months     pramipexole (MIRAPEX) 1 MG tablet Take 1.5 mg by mouth at  bedtime.     promethazine (PHENERGAN) 25 MG tablet Take 25 mg by mouth every 6 (six) hours as needed for nausea or vomiting (for migraine headaches).      Rimegepant Sulfate (NURTEC) 75 MG TBDP Take 75 mg by mouth daily as needed (migraines).     sertraline (ZOLOFT) 100 MG tablet Take by mouth.     valACYclovir (VALTREX) 500 MG tablet Take 1 tablet (500 mg total) by mouth 2 (two) times daily. (Patient taking differently: Take 500 mg by mouth as needed.) 30 tablet 6   zonisamide (ZONEGRAN) 100 MG capsule Take 100 mg by mouth daily.  1   No current facility-administered medications for this visit.    Allergies as of 07/27/2022 - Review Complete 05/02/2022  Allergen Reaction Noted   Other Itching and Other (See Comments) 11/01/2011   Codeine Other (See Comments) 07/11/2011   Hydrocodone Other (See Comments) 07/11/2011   Oxycodone Other (See Comments) 07/11/2011   Ultram [tramadol hcl] Other (See Comments) 07/11/2011   Vicodin [hydrocodone-acetaminophen] Other (See Comments) 07/11/2011    Family History  Problem Relation Age of Onset   Hypertension Mother    Hyperlipidemia Mother    Thyroid disease Mother    Depression Mother    Anxiety disorder Mother    Obesity Mother    Atrial fibrillation Mother    Heart disease Maternal Grandmother    Prostate cancer Maternal Grandfather     Social History    Socioeconomic History   Marital status: Married    Spouse name: Bryer Gottsch   Number of children: 2   Years of education: Not on file   Highest education level: Not on file  Occupational History   Occupation: Disabled  Tobacco Use   Smoking status: Never   Smokeless tobacco: Never  Vaping Use   Vaping Use: Never used  Substance and Sexual Activity   Alcohol use: Not Currently   Drug use: Not Currently   Sexual activity: Yes    Partners: Male    Birth control/protection: I.U.D.  Other Topics Concern   Not on file  Social History Narrative   Not on file   Social Determinants of Health   Financial Resource Strain: Not on file  Food Insecurity: No Food Insecurity (08/19/2020)   Hunger Vital Sign    Worried About Running Out of Food in the Last Year: Never true    Ran Out of Food in the Last Year: Never true  Transportation Needs: Not on file  Physical Activity: Not on file  Stress: Not on file  Social Connections: Not on file  Intimate Partner Violence: Not on file    Review of Systems:    Constitutional: No weight loss, fever or chills Cardiovascular: No chest pain  Respiratory: No SOB  Gastrointestinal: See HPI and otherwise negative   Physical Exam:  Vital signs: BP 118/78   Pulse 75   Ht '5\' 4"'$  (1.626 m)   Wt 158 lb (71.7 kg)   BMI 27.12 kg/m    Constitutional:   Pleasant Caucasian female appears to be in NAD, Well developed, Well nourished, alert and cooperative Respiratory: Respirations even and unlabored. Lungs clear to auscultation bilaterally.   No wheezes, crackles, or rhonchi.  Cardiovascular: Normal S1, S2. No MRG. Regular rate and rhythm. No peripheral edema, cyanosis or pallor.  Gastrointestinal:  Soft, nondistended, nontender. No rebound or guarding. Normal bowel sounds. No appreciable masses or hepatomegaly. Rectal:  Not performed.  Psychiatric: Demonstrates good judgement and reason without abnormal  affect or behaviors.  RELEVANT LABS AND  IMAGING: CBC    Component Value Date/Time   WBC 11.1 (H) 11/30/2020 0449   RBC 4.27 11/30/2020 0449   HGB 11.7 (L) 11/30/2020 0449   HGB 12.9 01/01/2019 1315   HCT 36.9 11/30/2020 0449   HCT 40.6 01/01/2019 1315   PLT 221 11/30/2020 0449   MCV 86.4 11/30/2020 0449   MCV 83 01/01/2019 1315   MCH 27.4 11/30/2020 0449   MCHC 31.7 11/30/2020 0449   RDW 16.6 (H) 11/30/2020 0449   RDW 14.8 01/01/2019 1315   LYMPHSABS 1.5 11/30/2020 0449   LYMPHSABS 2.3 01/01/2019 1315   MONOABS 0.8 11/30/2020 0449   EOSABS 0.0 11/30/2020 0449   EOSABS 0.0 01/01/2019 1315   BASOSABS 0.0 11/30/2020 0449   BASOSABS 0.1 01/01/2019 1315    CMP     Component Value Date/Time   NA 138 02/22/2021 0934   NA 138 02/22/2021 0934   NA 138 01/01/2019 1315   K 4.0 02/22/2021 0934   K 4.0 02/22/2021 0934   CL 106 02/22/2021 0934   CL 106 02/22/2021 0934   CO2 23 02/22/2021 0934   CO2 23 02/22/2021 0934   GLUCOSE 64 (L) 02/22/2021 0934   GLUCOSE 64 (L) 02/22/2021 0934   BUN 15 02/22/2021 0934   BUN 15 02/22/2021 0934   BUN 10 01/01/2019 1315   CREATININE 0.68 02/22/2021 0934   CREATININE 0.68 02/22/2021 0934   CALCIUM 9.5 02/22/2021 0934   CALCIUM 9.5 02/22/2021 0934   PROT 6.7 02/22/2021 0934   PROT 6.7 02/22/2021 0934   PROT 7.2 01/01/2019 1315   ALBUMIN 3.1 (L) 11/30/2020 0449   ALBUMIN 4.4 01/01/2019 1315   AST 22 02/22/2021 0934   AST 22 02/22/2021 0934   ALT 25 02/22/2021 0934   ALT 25 02/22/2021 0934   ALKPHOS 61 11/30/2020 0449   BILITOT 0.3 02/22/2021 0934   BILITOT 0.3 02/22/2021 0934   BILITOT 0.3 01/01/2019 1315   GFRNONAA 106 02/22/2021 0934   GFRAA 122 02/22/2021 0934    Assessment: 1.  Generalized abdominal pain with diarrhea and nausea: Acute illness for about 2-1/2 weeks which is since resolved, daughter was ill around the same time, but her symptoms did not last as long, CT normal; likely known IBS +/- gastroenteritis 2.  Dysphagia: Increasing since December 2021 when she  had Roux-en-Y gastric bypass, now increased hoarseness and trouble swallowing pills; consider GERD +/- esophagitis +/- stricture  Plan: 1.  Discussed with patient that likely what she experienced was a virus on top of her known IBS which made it last longer, CT was normal and reassuring.  She is feeling completely fine over the past couple of days. 2.  Discussed dysphagia, this has been going on for almost 2 years with some worsening of hoarseness and trouble swallowing pills recently.  Schedule patient for a barium esophagram with tablet but we will wait a couple of weeks out from diarrhea sometimes barium can give her diarrhea. 3.  Pending results from above we will consider EGD. 4.  Recommend the patient start Pepcid 40 mg twice a day, send in a prescription #60 with 3 refills.  (Patient cannot tolerate PPIs it causes constant diarrhea) 5.  Patient to follow in clinic per recommendations after imaging above.  Ellouise Newer, PA-C [facility] 07/27/2022, 1:40 PM  Cc: Leonard Downing, *

## 2022-07-27 NOTE — Progress Notes (Signed)
Reviewed and agree with management plans. ? ?Siah Steely L. Demoni Gergen, MD, MPH  ?

## 2022-07-27 NOTE — Patient Instructions (Signed)
We have sent the following medications to your pharmacy for you to pick up at your convenience: Famotidine 40 mg twice daily.   You have been scheduled for a Barium Esophogram at [facility] (1st floor of the hospital) on Thursday 08/10/22 at 10:30 am. Please arrive 15 minutes prior to your appointment for registration. Make certain not to have anything to eat or drink 3 hours prior to your test. If you need to reschedule for any reason, please contact radiology at [phone] to do so. __________________________________________________________________ A barium swallow is an examination that concentrates on views of the esophagus. This tends to be a double contrast exam (barium and two liquids which, when combined, create a gas to distend the wall of the oesophagus) or single contrast (non-ionic iodine based). The study is usually tailored to your symptoms so a good history is essential. Attention is paid during the study to the form, structure and configuration of the esophagus, looking for functional disorders (such as aspiration, dysphagia, achalasia, motility and reflux) EXAMINATION You may be asked to change into a gown, depending on the type of swallow being performed. A radiologist and radiographer will perform the procedure. The radiologist will advise you of the type of contrast selected for your procedure and direct you during the exam. You will be asked to stand, sit or lie in several different positions and to hold a small amount of fluid in your mouth before being asked to swallow while the imaging is performed .In some instances you may be asked to swallow barium coated marshmallows to assess the motility of a solid food bolus. The exam can be recorded as a digital or video fluoroscopy procedure. POST PROCEDURE It will take 1-2 days for the barium to pass through your system. To facilitate this, it is important, unless otherwise directed, to increase your fluids for the next  24-48hrs and to resume your normal diet.  This test typically takes about 30 minutes to perform. __________________________________________________________________________________

## 2022-08-01 DIAGNOSIS — F3181 Bipolar II disorder: Secondary | ICD-10-CM | POA: Diagnosis not present

## 2022-08-01 DIAGNOSIS — F900 Attention-deficit hyperactivity disorder, predominantly inattentive type: Secondary | ICD-10-CM | POA: Diagnosis not present

## 2022-08-01 DIAGNOSIS — F429 Obsessive-compulsive disorder, unspecified: Secondary | ICD-10-CM | POA: Diagnosis not present

## 2022-08-01 DIAGNOSIS — R69 Illness, unspecified: Secondary | ICD-10-CM | POA: Diagnosis not present

## 2022-08-10 ENCOUNTER — Ambulatory Visit (HOSPITAL_COMMUNITY): Admission: RE | Admit: 2022-08-10 | Payer: Medicare HMO | Source: Ambulatory Visit

## 2022-08-10 ENCOUNTER — Encounter (HOSPITAL_COMMUNITY): Payer: Self-pay

## 2022-08-11 DIAGNOSIS — L659 Nonscarring hair loss, unspecified: Secondary | ICD-10-CM | POA: Diagnosis not present

## 2022-08-11 DIAGNOSIS — E569 Vitamin deficiency, unspecified: Secondary | ICD-10-CM | POA: Diagnosis not present

## 2022-08-11 DIAGNOSIS — R5383 Other fatigue: Secondary | ICD-10-CM | POA: Diagnosis not present

## 2022-08-11 DIAGNOSIS — E669 Obesity, unspecified: Secondary | ICD-10-CM | POA: Diagnosis not present

## 2022-08-11 DIAGNOSIS — K912 Postsurgical malabsorption, not elsewhere classified: Secondary | ICD-10-CM | POA: Diagnosis not present

## 2022-08-11 DIAGNOSIS — Z1321 Encounter for screening for nutritional disorder: Secondary | ICD-10-CM | POA: Diagnosis not present

## 2022-08-11 DIAGNOSIS — Z9884 Bariatric surgery status: Secondary | ICD-10-CM | POA: Diagnosis not present

## 2022-08-11 DIAGNOSIS — R69 Illness, unspecified: Secondary | ICD-10-CM | POA: Diagnosis not present

## 2022-08-18 ENCOUNTER — Other Ambulatory Visit: Payer: Self-pay | Admitting: Physician Assistant

## 2022-08-18 ENCOUNTER — Ambulatory Visit (HOSPITAL_COMMUNITY)
Admission: RE | Admit: 2022-08-18 | Discharge: 2022-08-18 | Disposition: A | Discharge status: Home / Self Care | Payer: Medicare HMO | Source: Ambulatory Visit | Attending: Physician Assistant | Admitting: Physician Assistant

## 2022-08-18 DIAGNOSIS — K224 Dyskinesia of esophagus: Secondary | ICD-10-CM | POA: Diagnosis not present

## 2022-08-18 DIAGNOSIS — R1310 Dysphagia, unspecified: Secondary | ICD-10-CM | POA: Insufficient documentation

## 2022-08-18 DIAGNOSIS — K219 Gastro-esophageal reflux disease without esophagitis: Secondary | ICD-10-CM | POA: Diagnosis not present

## 2022-08-21 ENCOUNTER — Other Ambulatory Visit: Payer: Self-pay | Admitting: Obstetrics & Gynecology

## 2022-08-21 ENCOUNTER — Ambulatory Visit (INDEPENDENT_AMBULATORY_CARE_PROVIDER_SITE_OTHER): Payer: Medicare HMO | Admitting: Obstetrics & Gynecology

## 2022-08-21 ENCOUNTER — Other Ambulatory Visit (HOSPITAL_COMMUNITY)
Admission: RE | Admit: 2022-08-21 | Discharge: 2022-08-21 | Disposition: A | Discharge status: Home / Self Care | Payer: Medicare HMO | Source: Ambulatory Visit | Attending: Obstetrics & Gynecology | Admitting: Obstetrics & Gynecology

## 2022-08-21 ENCOUNTER — Ambulatory Visit: Payer: Medicare HMO | Admitting: Gastroenterology

## 2022-08-21 ENCOUNTER — Encounter: Payer: Self-pay | Admitting: Obstetrics & Gynecology

## 2022-08-21 VITALS — BP 112/62 | HR 72 | Ht 64.5 in | Wt 156.0 lb

## 2022-08-21 DIAGNOSIS — Z9884 Bariatric surgery status: Secondary | ICD-10-CM | POA: Diagnosis not present

## 2022-08-21 DIAGNOSIS — Z30431 Encounter for routine checking of intrauterine contraceptive device: Secondary | ICD-10-CM

## 2022-08-21 DIAGNOSIS — B009 Herpesviral infection, unspecified: Secondary | ICD-10-CM | POA: Diagnosis not present

## 2022-08-21 DIAGNOSIS — Z124 Encounter for screening for malignant neoplasm of cervix: Secondary | ICD-10-CM

## 2022-08-21 DIAGNOSIS — Z01419 Encounter for gynecological examination (general) (routine) without abnormal findings: Secondary | ICD-10-CM | POA: Insufficient documentation

## 2022-08-21 DIAGNOSIS — Z9189 Other specified personal risk factors, not elsewhere classified: Secondary | ICD-10-CM

## 2022-08-21 DIAGNOSIS — Z1231 Encounter for screening mammogram for malignant neoplasm of breast: Secondary | ICD-10-CM

## 2022-08-21 NOTE — Progress Notes (Signed)
Gloria Rodriguez 1976/06/04 [id number]   History:    46 y.o. Remarried x 10 yrs.  G2P2L2  Daughter 30+ yo, son 37+ yo.   RP:  Established patient for annual gyn exam   HPI:  Mirena IUD x 07/2020.  Menses light normal.  No pelvic pain.  Rarely sexually active. Pap Neg 02/2020.  No h/o abnormal Pap.  Pap reflex today. CT scan 07/25/22 Small uterine fibroids.  Breasts wnl.  Mammo Neg in 2017, will schedule now.  Roux en Y Gastric Bypass 11/29/2020.  Lost another 20 Lbs x last year.  Current BMI 26.36. Under investigation for dysphagia.  Many food intolerances.  Colono 2020. Health labs with Fam MD.   Past medical history,surgical history, family history and social history were all reviewed and documented in the EPIC chart.  Gynecologic History No LMP recorded. (Menstrual status: IUD).  Obstetric History OB History  Gravida Para Term Preterm AB Living  '2 2 2 '$ 0 0 2  SAB IAB Ectopic Multiple Live Births  0 0 0 0 2    # Outcome Date GA Lbr Len/2nd Weight Sex Delivery Anes PTL Lv  2 Term 07/11/11 [redacted]w[redacted]d 5 lb 3.4 oz (2.365 kg) F CS-LTranv EPI  LIV  1 Term 01/2001 [id number] lb 13 oz (3.544 kg) F CS-LTranv Spinal N LIV     Birth Comments: c/s for HSV outbreak     ROS: A ROS was performed and pertinent positives and negatives are included in the history.  GENERAL: No fevers or chills. HEENT: No change in vision, no earache, sore throat or sinus congestion. NECK: No pain or stiffness. CARDIOVASCULAR: No chest pain or pressure. No palpitations. PULMONARY: No shortness of breath, cough or wheeze. GASTROINTESTINAL: No abdominal pain, nausea, vomiting or diarrhea, melena or bright red blood per rectum. GENITOURINARY: No urinary frequency, urgency, hesitancy or dysuria. MUSCULOSKELETAL: No joint or muscle pain, no back pain, no recent trauma. DERMATOLOGIC: No rash, no itching, no lesions. ENDOCRINE: No polyuria, polydipsia, no heat or cold intolerance. No recent change in weight. HEMATOLOGICAL: No anemia or  easy bruising or bleeding. NEUROLOGIC: No headache, seizures, numbness, tingling or weakness. PSYCHIATRIC: No depression, no loss of interest in normal activity or change in sleep pattern.     Exam:   BP 112/62   Pulse 72   Ht 5' 4.5" (1.638 m)   Wt 156 lb (70.8 kg)   SpO2 100%   BMI 26.36 kg/m   Body mass index is 26.36 kg/m.  General appearance : Well developed well nourished female. No acute distress HEENT: Eyes: no retinal hemorrhage or exudates,  Neck supple, trachea midline, no carotid bruits, no thyroidmegaly Lungs: Clear to auscultation, no rhonchi or wheezes, or rib retractions  Heart: Regular rate and rhythm, no murmurs or gallops Breast:Examined in sitting and supine position were symmetrical in appearance, no palpable masses or tenderness,  no skin retraction, no nipple inversion, no nipple discharge, no skin discoloration, no axillary or supraclavicular lymphadenopathy Abdomen: no palpable masses or tenderness, no rebound or guarding Extremities: no edema or skin discoloration or tenderness  Pelvic: Vulva: Normal             Vagina: No gross lesions or discharge  Cervix: No gross lesions or discharge. IUD strings visible at the EO. Pap reflex done.  Uterus  AV, normal size, shape and consistency, non-tender and mobile  Adnexa  Without masses or tenderness  Anus: Normal   Assessment/Plan:  [age].o. female  for annual exam   1. Encounter for routine gynecological examination with Papanicolaou smear of cervix Mirena IUD x 07/2020.  Menses light normal.  No pelvic pain.  Rarely sexually active. Pap Neg 02/2020.  No h/o abnormal Pap.  Pap reflex today. CT scan 07/25/22 Small uterine fibroids.  Breasts wnl.  Mammo Neg in 2017, will schedule now.  Roux en Y Gastric Bypass 11/29/2020.  Lost another 20 Lbs x last year.  Current BMI 26.36. Under investigation for dysphagia.  Many food intolerances.  [place] 2020. Health labs with Fam MD. - Cytology - PAP( [place])  2. IUD  check up Mirena IUD x 07/2020.  Menses light normal.  No pelvic pain.  Rarely sexually active.  3. S/P gastric bypass  Roux en Y Gastric Bypass 11/29/2020.  Lost another 20 Lbs x last year.  Current BMI 26.36.  Princess Bruins MD, 1:53 PM 08/21/2022

## 2022-08-25 LAB — CYTOLOGY - PAP: Diagnosis: NEGATIVE

## 2022-08-30 ENCOUNTER — Ambulatory Visit: Payer: Medicare HMO

## 2022-09-06 DIAGNOSIS — F3181 Bipolar II disorder: Secondary | ICD-10-CM | POA: Diagnosis not present

## 2022-09-06 DIAGNOSIS — F429 Obsessive-compulsive disorder, unspecified: Secondary | ICD-10-CM | POA: Diagnosis not present

## 2022-09-06 DIAGNOSIS — F900 Attention-deficit hyperactivity disorder, predominantly inattentive type: Secondary | ICD-10-CM | POA: Diagnosis not present

## 2022-09-06 DIAGNOSIS — R69 Illness, unspecified: Secondary | ICD-10-CM | POA: Diagnosis not present

## 2022-09-07 DIAGNOSIS — M542 Cervicalgia: Secondary | ICD-10-CM | POA: Diagnosis not present

## 2022-09-11 ENCOUNTER — Other Ambulatory Visit: Payer: Self-pay | Admitting: Sports Medicine

## 2022-09-11 DIAGNOSIS — R1310 Dysphagia, unspecified: Secondary | ICD-10-CM

## 2022-09-19 ENCOUNTER — Ambulatory Visit
Admission: RE | Admit: 2022-09-19 | Discharge: 2022-09-19 | Disposition: A | Discharge status: Home / Self Care | Payer: Medicare HMO | Source: Ambulatory Visit | Attending: Sports Medicine | Admitting: Sports Medicine

## 2022-09-19 DIAGNOSIS — R1310 Dysphagia, unspecified: Secondary | ICD-10-CM | POA: Diagnosis not present

## 2022-09-19 DIAGNOSIS — M47812 Spondylosis without myelopathy or radiculopathy, cervical region: Secondary | ICD-10-CM | POA: Diagnosis not present

## 2022-09-19 MED ORDER — IOPAMIDOL (ISOVUE-300) INJECTION 61%
75.0000 mL | Freq: Once | INTRAVENOUS | Status: AC | PRN
Start: 2022-09-19 — End: 2022-09-19
  Administered 2022-09-19: 75 mL via INTRAVENOUS

## 2022-09-20 ENCOUNTER — Ambulatory Visit
Admission: RE | Admit: 2022-09-20 | Discharge: 2022-09-20 | Disposition: A | Discharge status: Home / Self Care | Payer: Medicare HMO | Source: Ambulatory Visit | Attending: Obstetrics & Gynecology | Admitting: Obstetrics & Gynecology

## 2022-09-20 DIAGNOSIS — Z1231 Encounter for screening mammogram for malignant neoplasm of breast: Secondary | ICD-10-CM | POA: Diagnosis not present

## 2022-09-20 DIAGNOSIS — M542 Cervicalgia: Secondary | ICD-10-CM | POA: Diagnosis not present

## 2022-09-20 DIAGNOSIS — G518 Other disorders of facial nerve: Secondary | ICD-10-CM | POA: Diagnosis not present

## 2022-09-20 DIAGNOSIS — M791 Myalgia, unspecified site: Secondary | ICD-10-CM | POA: Diagnosis not present

## 2022-09-20 DIAGNOSIS — G43719 Chronic migraine without aura, intractable, without status migrainosus: Secondary | ICD-10-CM | POA: Diagnosis not present

## 2022-09-22 ENCOUNTER — Other Ambulatory Visit: Payer: Self-pay | Admitting: Obstetrics & Gynecology

## 2022-09-22 DIAGNOSIS — R928 Other abnormal and inconclusive findings on diagnostic imaging of breast: Secondary | ICD-10-CM

## 2022-09-26 ENCOUNTER — Telehealth: Payer: Self-pay | Admitting: Physician Assistant

## 2022-09-26 NOTE — Telephone Encounter (Signed)
Patient called states she would like [name] to refer her to someone who can handle esophagus dysmotility disorder. States she wants Dr Tarri Glenn herself to send in the referral.

## 2022-09-26 NOTE — Telephone Encounter (Signed)
Patient called in with complaints of dysphagia that has not improved & requesting a referral to an esophageal dysmotility specialist if [name] feels that it is necessary. She was last seen with Anderson Malta, [place] on 07/27/22. She is not taking pepcid as prescribed at visit d/t the side effects of diarrhea, similar to how she is with PPI's. PCP recommended she follow up with an orthopedist and neurologist, and ultimately back with [name]. Orthopedist performed CT & ruled out anything spinal potentially causing symptoms. Neurologist declined to see her since symptoms did not seem fitting. Patient requesting further recommendations.

## 2022-09-28 NOTE — Telephone Encounter (Signed)
Referral faxed to [facility].

## 2022-09-28 NOTE — Telephone Encounter (Signed)
Pt was made aware that the referral has been sent to [place] [facility] and that she should be receiving a call from them within a week or two. Pt notified to call our office back if she has not heard anything in two weeks from their office so that we can check on the status of the referral: Pt verbalized understanding with all questions answered.

## 2022-10-02 ENCOUNTER — Other Ambulatory Visit: Payer: Self-pay | Admitting: Obstetrics & Gynecology

## 2022-10-02 ENCOUNTER — Ambulatory Visit
Admission: RE | Admit: 2022-10-02 | Discharge: 2022-10-02 | Disposition: A | Discharge status: Home / Self Care | Payer: Medicare HMO | Source: Ambulatory Visit | Attending: Obstetrics & Gynecology | Admitting: Obstetrics & Gynecology

## 2022-10-02 DIAGNOSIS — R92322 Mammographic fibroglandular density, left breast: Secondary | ICD-10-CM | POA: Diagnosis not present

## 2022-10-02 DIAGNOSIS — R928 Other abnormal and inconclusive findings on diagnostic imaging of breast: Secondary | ICD-10-CM | POA: Diagnosis not present

## 2022-10-02 DIAGNOSIS — N6320 Unspecified lump in the left breast, unspecified quadrant: Secondary | ICD-10-CM

## 2022-10-02 DIAGNOSIS — N6002 Solitary cyst of left breast: Secondary | ICD-10-CM | POA: Diagnosis not present

## 2022-10-03 NOTE — Telephone Encounter (Signed)
Patient called states she wasn't able to schedule an appT with [facility] due to their schedule being 3 months out. Wondering if she can be refer to [facility] fax # [phone] and phone # [phone]. Requesting a call back as possible.

## 2022-10-04 NOTE — Telephone Encounter (Signed)
Left message for pt to call back  °

## 2022-10-05 DIAGNOSIS — F900 Attention-deficit hyperactivity disorder, predominantly inattentive type: Secondary | ICD-10-CM | POA: Diagnosis not present

## 2022-10-05 DIAGNOSIS — R69 Illness, unspecified: Secondary | ICD-10-CM | POA: Diagnosis not present

## 2022-10-05 DIAGNOSIS — F429 Obsessive-compulsive disorder, unspecified: Secondary | ICD-10-CM | POA: Diagnosis not present

## 2022-10-05 NOTE — Telephone Encounter (Signed)
Spoke with patient & referral sent to [facility]. Advised she keep appointment with [facility], since [facility] will most likely be the same for appointment time.

## 2022-10-10 ENCOUNTER — Ambulatory Visit
Admission: RE | Admit: 2022-10-10 | Discharge: 2022-10-10 | Disposition: A | Discharge status: Home / Self Care | Payer: Medicare HMO | Source: Ambulatory Visit | Attending: Obstetrics & Gynecology | Admitting: Obstetrics & Gynecology

## 2022-10-10 DIAGNOSIS — N6320 Unspecified lump in the left breast, unspecified quadrant: Secondary | ICD-10-CM

## 2022-10-10 DIAGNOSIS — N6322 Unspecified lump in the left breast, upper inner quadrant: Secondary | ICD-10-CM | POA: Diagnosis not present

## 2022-10-10 DIAGNOSIS — D242 Benign neoplasm of left breast: Secondary | ICD-10-CM | POA: Diagnosis not present

## 2022-10-10 HISTORY — PX: BREAST BIOPSY: SHX20

## 2022-10-11 DIAGNOSIS — F429 Obsessive-compulsive disorder, unspecified: Secondary | ICD-10-CM | POA: Diagnosis not present

## 2022-10-11 DIAGNOSIS — R69 Illness, unspecified: Secondary | ICD-10-CM | POA: Diagnosis not present

## 2022-10-11 DIAGNOSIS — F900 Attention-deficit hyperactivity disorder, predominantly inattentive type: Secondary | ICD-10-CM | POA: Diagnosis not present

## 2022-10-11 NOTE — Telephone Encounter (Signed)
Patient returning call from a previous voicemail that has already been addressed. However, reminded her to contact us by next week if she has not heard from [facility] regarding an appointment. Office number for [facility] also provided for patient.

## 2022-10-23 NOTE — Telephone Encounter (Signed)
Called & spoke with [facility] and they stated the referral had not been received. Fax number provided again & referral re-faxed to [phone]. Patient is aware.

## 2022-10-23 NOTE — Telephone Encounter (Signed)
Patient called stating that she had not heard anything from [facility] about her referral. Patient stated she called and was given a number for [place] to call to follow up on the referral. Patient gave the number [phone] and opt 1. Patient is wanting to follow up on referral. Please advise.

## 2022-10-31 DIAGNOSIS — M791 Myalgia, unspecified site: Secondary | ICD-10-CM | POA: Diagnosis not present

## 2022-10-31 DIAGNOSIS — M542 Cervicalgia: Secondary | ICD-10-CM | POA: Diagnosis not present

## 2022-10-31 DIAGNOSIS — G518 Other disorders of facial nerve: Secondary | ICD-10-CM | POA: Diagnosis not present

## 2022-10-31 DIAGNOSIS — G43719 Chronic migraine without aura, intractable, without status migrainosus: Secondary | ICD-10-CM | POA: Diagnosis not present

## 2022-11-07 DIAGNOSIS — Z008 Encounter for other general examination: Secondary | ICD-10-CM | POA: Diagnosis not present

## 2022-11-07 DIAGNOSIS — Z8249 Family history of ischemic heart disease and other diseases of the circulatory system: Secondary | ICD-10-CM | POA: Diagnosis not present

## 2022-11-07 DIAGNOSIS — F319 Bipolar disorder, unspecified: Secondary | ICD-10-CM | POA: Diagnosis not present

## 2022-11-07 DIAGNOSIS — F4310 Post-traumatic stress disorder, unspecified: Secondary | ICD-10-CM | POA: Diagnosis not present

## 2022-11-07 DIAGNOSIS — R69 Illness, unspecified: Secondary | ICD-10-CM | POA: Diagnosis not present

## 2022-11-07 DIAGNOSIS — K08409 Partial loss of teeth, unspecified cause, unspecified class: Secondary | ICD-10-CM | POA: Diagnosis not present

## 2022-11-07 DIAGNOSIS — F902 Attention-deficit hyperactivity disorder, combined type: Secondary | ICD-10-CM | POA: Diagnosis not present

## 2022-11-07 DIAGNOSIS — G629 Polyneuropathy, unspecified: Secondary | ICD-10-CM | POA: Diagnosis not present

## 2022-11-07 DIAGNOSIS — K219 Gastro-esophageal reflux disease without esophagitis: Secondary | ICD-10-CM | POA: Diagnosis not present

## 2022-11-07 DIAGNOSIS — R32 Unspecified urinary incontinence: Secondary | ICD-10-CM | POA: Diagnosis not present

## 2022-11-07 DIAGNOSIS — F411 Generalized anxiety disorder: Secondary | ICD-10-CM | POA: Diagnosis not present

## 2022-11-07 DIAGNOSIS — Z87891 Personal history of nicotine dependence: Secondary | ICD-10-CM | POA: Diagnosis not present

## 2022-11-07 DIAGNOSIS — G2581 Restless legs syndrome: Secondary | ICD-10-CM | POA: Diagnosis not present

## 2022-11-07 DIAGNOSIS — Z9181 History of falling: Secondary | ICD-10-CM | POA: Diagnosis not present

## 2022-12-26 DIAGNOSIS — F429 Obsessive-compulsive disorder, unspecified: Secondary | ICD-10-CM | POA: Diagnosis not present

## 2022-12-26 DIAGNOSIS — F900 Attention-deficit hyperactivity disorder, predominantly inattentive type: Secondary | ICD-10-CM | POA: Diagnosis not present

## 2022-12-26 DIAGNOSIS — R69 Illness, unspecified: Secondary | ICD-10-CM | POA: Diagnosis not present

## 2023-01-02 DIAGNOSIS — R09A2 Foreign body sensation, throat: Secondary | ICD-10-CM | POA: Diagnosis not present

## 2023-01-02 DIAGNOSIS — K219 Gastro-esophageal reflux disease without esophagitis: Secondary | ICD-10-CM | POA: Diagnosis not present

## 2023-01-02 DIAGNOSIS — Z9049 Acquired absence of other specified parts of digestive tract: Secondary | ICD-10-CM | POA: Diagnosis not present

## 2023-01-02 DIAGNOSIS — R1319 Other dysphagia: Secondary | ICD-10-CM | POA: Diagnosis not present

## 2023-01-02 DIAGNOSIS — Z9884 Bariatric surgery status: Secondary | ICD-10-CM | POA: Diagnosis not present

## 2023-01-10 DIAGNOSIS — K219 Gastro-esophageal reflux disease without esophagitis: Secondary | ICD-10-CM | POA: Diagnosis not present

## 2023-01-10 DIAGNOSIS — H524 Presbyopia: Secondary | ICD-10-CM | POA: Diagnosis not present

## 2023-01-10 DIAGNOSIS — H5213 Myopia, bilateral: Secondary | ICD-10-CM | POA: Diagnosis not present

## 2023-01-10 DIAGNOSIS — R1319 Other dysphagia: Secondary | ICD-10-CM | POA: Diagnosis not present

## 2023-01-10 DIAGNOSIS — H52209 Unspecified astigmatism, unspecified eye: Secondary | ICD-10-CM | POA: Diagnosis not present

## 2023-01-10 DIAGNOSIS — R09A2 Foreign body sensation, throat: Secondary | ICD-10-CM | POA: Diagnosis not present

## 2023-01-16 DIAGNOSIS — J3089 Other allergic rhinitis: Secondary | ICD-10-CM | POA: Diagnosis not present

## 2023-01-16 DIAGNOSIS — J3081 Allergic rhinitis due to animal (cat) (dog) hair and dander: Secondary | ICD-10-CM | POA: Diagnosis not present

## 2023-01-16 DIAGNOSIS — J301 Allergic rhinitis due to pollen: Secondary | ICD-10-CM | POA: Diagnosis not present

## 2023-01-16 DIAGNOSIS — T781XXD Other adverse food reactions, not elsewhere classified, subsequent encounter: Secondary | ICD-10-CM | POA: Diagnosis not present

## 2023-01-17 DIAGNOSIS — Z008 Encounter for other general examination: Secondary | ICD-10-CM | POA: Diagnosis not present

## 2023-01-17 DIAGNOSIS — R69 Illness, unspecified: Secondary | ICD-10-CM | POA: Diagnosis not present

## 2023-01-17 DIAGNOSIS — J301 Allergic rhinitis due to pollen: Secondary | ICD-10-CM | POA: Diagnosis not present

## 2023-01-17 DIAGNOSIS — Z8249 Family history of ischemic heart disease and other diseases of the circulatory system: Secondary | ICD-10-CM | POA: Diagnosis not present

## 2023-01-17 DIAGNOSIS — Z87891 Personal history of nicotine dependence: Secondary | ICD-10-CM | POA: Diagnosis not present

## 2023-01-17 DIAGNOSIS — G43909 Migraine, unspecified, not intractable, without status migrainosus: Secondary | ICD-10-CM | POA: Diagnosis not present

## 2023-01-17 DIAGNOSIS — G2581 Restless legs syndrome: Secondary | ICD-10-CM | POA: Diagnosis not present

## 2023-01-17 DIAGNOSIS — R32 Unspecified urinary incontinence: Secondary | ICD-10-CM | POA: Diagnosis not present

## 2023-01-17 DIAGNOSIS — B009 Herpesviral infection, unspecified: Secondary | ICD-10-CM | POA: Diagnosis not present

## 2023-01-23 DIAGNOSIS — F429 Obsessive-compulsive disorder, unspecified: Secondary | ICD-10-CM | POA: Diagnosis not present

## 2023-01-23 DIAGNOSIS — F900 Attention-deficit hyperactivity disorder, predominantly inattentive type: Secondary | ICD-10-CM | POA: Diagnosis not present

## 2023-01-23 DIAGNOSIS — R69 Illness, unspecified: Secondary | ICD-10-CM | POA: Diagnosis not present

## 2023-03-06 DIAGNOSIS — F900 Attention-deficit hyperactivity disorder, predominantly inattentive type: Secondary | ICD-10-CM | POA: Diagnosis not present

## 2023-03-06 DIAGNOSIS — R69 Illness, unspecified: Secondary | ICD-10-CM | POA: Diagnosis not present

## 2023-03-06 DIAGNOSIS — F429 Obsessive-compulsive disorder, unspecified: Secondary | ICD-10-CM | POA: Diagnosis not present

## 2023-03-13 DIAGNOSIS — G43719 Chronic migraine without aura, intractable, without status migrainosus: Secondary | ICD-10-CM | POA: Diagnosis not present

## 2023-03-13 DIAGNOSIS — M791 Myalgia, unspecified site: Secondary | ICD-10-CM | POA: Diagnosis not present

## 2023-03-13 DIAGNOSIS — G518 Other disorders of facial nerve: Secondary | ICD-10-CM | POA: Diagnosis not present

## 2023-03-13 DIAGNOSIS — M542 Cervicalgia: Secondary | ICD-10-CM | POA: Diagnosis not present

## 2023-03-16 DIAGNOSIS — N200 Calculus of kidney: Secondary | ICD-10-CM | POA: Diagnosis not present

## 2023-03-16 DIAGNOSIS — R300 Dysuria: Secondary | ICD-10-CM | POA: Diagnosis not present

## 2023-03-16 DIAGNOSIS — R350 Frequency of micturition: Secondary | ICD-10-CM | POA: Diagnosis not present

## 2023-03-16 DIAGNOSIS — R8279 Other abnormal findings on microbiological examination of urine: Secondary | ICD-10-CM | POA: Diagnosis not present

## 2023-03-27 DIAGNOSIS — R1084 Generalized abdominal pain: Secondary | ICD-10-CM | POA: Diagnosis not present

## 2023-03-27 DIAGNOSIS — R8271 Bacteriuria: Secondary | ICD-10-CM | POA: Diagnosis not present

## 2023-03-27 DIAGNOSIS — R300 Dysuria: Secondary | ICD-10-CM | POA: Diagnosis not present

## 2023-04-10 DIAGNOSIS — F900 Attention-deficit hyperactivity disorder, predominantly inattentive type: Secondary | ICD-10-CM | POA: Diagnosis not present

## 2023-04-10 DIAGNOSIS — F429 Obsessive-compulsive disorder, unspecified: Secondary | ICD-10-CM | POA: Diagnosis not present

## 2023-04-16 DIAGNOSIS — R09A2 Foreign body sensation, throat: Secondary | ICD-10-CM | POA: Diagnosis not present

## 2023-04-16 DIAGNOSIS — K219 Gastro-esophageal reflux disease without esophagitis: Secondary | ICD-10-CM | POA: Diagnosis not present

## 2023-04-16 DIAGNOSIS — R1310 Dysphagia, unspecified: Secondary | ICD-10-CM | POA: Diagnosis not present

## 2023-04-16 DIAGNOSIS — R1319 Other dysphagia: Secondary | ICD-10-CM | POA: Diagnosis not present

## 2023-04-16 DIAGNOSIS — Z9884 Bariatric surgery status: Secondary | ICD-10-CM | POA: Diagnosis not present

## 2023-04-23 DIAGNOSIS — G43711 Chronic migraine without aura, intractable, with status migrainosus: Secondary | ICD-10-CM | POA: Diagnosis not present

## 2023-04-23 DIAGNOSIS — F3181 Bipolar II disorder: Secondary | ICD-10-CM | POA: Diagnosis not present

## 2023-04-23 DIAGNOSIS — F900 Attention-deficit hyperactivity disorder, predominantly inattentive type: Secondary | ICD-10-CM | POA: Diagnosis not present

## 2023-04-23 DIAGNOSIS — F429 Obsessive-compulsive disorder, unspecified: Secondary | ICD-10-CM | POA: Diagnosis not present

## 2023-04-23 DIAGNOSIS — Z5181 Encounter for therapeutic drug level monitoring: Secondary | ICD-10-CM | POA: Diagnosis not present

## 2023-05-03 DIAGNOSIS — G43719 Chronic migraine without aura, intractable, without status migrainosus: Secondary | ICD-10-CM | POA: Diagnosis not present

## 2023-05-03 DIAGNOSIS — G518 Other disorders of facial nerve: Secondary | ICD-10-CM | POA: Diagnosis not present

## 2023-05-03 DIAGNOSIS — M542 Cervicalgia: Secondary | ICD-10-CM | POA: Diagnosis not present

## 2023-05-03 DIAGNOSIS — M791 Myalgia, unspecified site: Secondary | ICD-10-CM | POA: Diagnosis not present

## 2023-05-15 DIAGNOSIS — K219 Gastro-esophageal reflux disease without esophagitis: Secondary | ICD-10-CM | POA: Diagnosis not present

## 2023-05-15 DIAGNOSIS — R1319 Other dysphagia: Secondary | ICD-10-CM | POA: Diagnosis not present

## 2023-05-29 DIAGNOSIS — F429 Obsessive-compulsive disorder, unspecified: Secondary | ICD-10-CM | POA: Diagnosis not present

## 2023-06-05 ENCOUNTER — Encounter: Payer: Self-pay | Admitting: Obstetrics & Gynecology

## 2023-06-05 ENCOUNTER — Ambulatory Visit (INDEPENDENT_AMBULATORY_CARE_PROVIDER_SITE_OTHER): Payer: Medicare HMO | Admitting: Obstetrics & Gynecology

## 2023-06-05 VITALS — BP 124/76 | HR 97

## 2023-06-05 DIAGNOSIS — G43829 Menstrual migraine, not intractable, without status migrainosus: Secondary | ICD-10-CM | POA: Diagnosis not present

## 2023-06-05 DIAGNOSIS — N951 Menopausal and female climacteric states: Secondary | ICD-10-CM | POA: Diagnosis not present

## 2023-06-05 DIAGNOSIS — Z975 Presence of (intrauterine) contraceptive device: Secondary | ICD-10-CM | POA: Diagnosis not present

## 2023-06-05 NOTE — Progress Notes (Signed)
    Gloria Rodriguez [birth year] [id number]        47 y.o.  G2P2L2   RP: Counseling on Perimenopause/Menopause in the context of hormonal migraines  HPI: Well on Mirena IUD x 07/2020.  No menses or BTB x a few months post insertion.  Longstanding hormonal migraines on Aimovig.  Also on Lamotrigine, Zonisamide and Adderall, Clomipramine... Would like to evaluate for possible perimenopause or menopause as her migraines have improved in the last 2 months and would like to stop Aimovig if [facility] is elevated.   OB History  Gravida Para Term Preterm AB Living  2 2 2  0 0 2  SAB IAB Ectopic Multiple Live Births  0 0 0 0 2    # Outcome Date GA Lbr Len/2nd Weight Sex Delivery Anes PTL Lv  2 Term 07/11/11 [redacted]w[redacted]d  5 lb 3.4 oz (2.365 kg) F CS-LTranv EPI  LIV  1 Term 01/2001 [redacted]w[redacted]d  7 lb 13 oz (3.544 kg) F CS-LTranv Spinal N LIV     Birth Comments: c/s for HSV outbreak    Past medical history,surgical history, problem list, medications, allergies, family history and social history were all reviewed and documented in the EPIC chart.   Directed ROS with pertinent positives and negatives documented in the history of present illness/assessment and plan.  Exam:  Vitals:   06/05/23 1333  BP: 124/76  Pulse: 97  SpO2: 98%   General appearance:  Normal  Gynecologic exam: Deferred   Assessment/Plan:  47 y.o. G2P2002   1. Perimenopause Well on Mirena IUD x 07/2020.  No menses or BTB x a few months post insertion.  Longstanding hormonal migraines on Aimovig.  Also on Lamotrigine, Zonisamide and Adderall, Clomipramine... Would like to evaluate for possible perimenopause or menopause as her migraines have improved in the last 2 months and would like to stop Aimovig if [facility] is elevated.  FSH pending.  Counseling done on Perimenopause/Menopause.  Patient informed that [facility] may be variable at this phase of her life. - FSH  2. Menstrual migraine without status migrainosus, not intractable Migraines improved in the  last 2 months.  Would like to stop some of her Migraine medications.    3. Progesterone IUD contraception Continue with Progesterone IUD up to 8 years, 07/2028.  Other orders - Vitamin D, Ergocalciferol, (DRISDOL) 1.25 MG (50000 UNIT) CAPS capsule; TAKE 1 CAPSULE (50,000 UNITS TOTAL) BY MOUTH EVERY 7 (SEVEN) DAYS. Oral for 28   Other orders - Vitamin D, Ergocalciferol, (DRISDOL) 1.25 MG (50000 UNIT) CAPS capsule; TAKE 1 CAPSULE (50,000 UNITS TOTAL) BY MOUTH EVERY 7 (SEVEN) DAYS. Oral for 28   Genia Del MD, 1:37 PM 06/05/2023

## 2023-06-06 LAB — FOLLICLE STIMULATING HORMONE: FSH: 55.7 m[IU]/mL

## 2023-06-12 DIAGNOSIS — M542 Cervicalgia: Secondary | ICD-10-CM | POA: Diagnosis not present

## 2023-06-12 DIAGNOSIS — G518 Other disorders of facial nerve: Secondary | ICD-10-CM | POA: Diagnosis not present

## 2023-06-12 DIAGNOSIS — G43719 Chronic migraine without aura, intractable, without status migrainosus: Secondary | ICD-10-CM | POA: Diagnosis not present

## 2023-06-12 DIAGNOSIS — M791 Myalgia, unspecified site: Secondary | ICD-10-CM | POA: Diagnosis not present

## 2023-06-22 DIAGNOSIS — R519 Headache, unspecified: Secondary | ICD-10-CM | POA: Diagnosis not present

## 2023-06-26 DIAGNOSIS — F429 Obsessive-compulsive disorder, unspecified: Secondary | ICD-10-CM | POA: Diagnosis not present

## 2023-07-24 DIAGNOSIS — M791 Myalgia, unspecified site: Secondary | ICD-10-CM | POA: Diagnosis not present

## 2023-07-24 DIAGNOSIS — G43719 Chronic migraine without aura, intractable, without status migrainosus: Secondary | ICD-10-CM | POA: Diagnosis not present

## 2023-07-24 DIAGNOSIS — M542 Cervicalgia: Secondary | ICD-10-CM | POA: Diagnosis not present

## 2023-07-24 DIAGNOSIS — G518 Other disorders of facial nerve: Secondary | ICD-10-CM | POA: Diagnosis not present

## 2023-08-28 DIAGNOSIS — E559 Vitamin D deficiency, unspecified: Secondary | ICD-10-CM | POA: Diagnosis not present

## 2023-08-28 DIAGNOSIS — Z9884 Bariatric surgery status: Secondary | ICD-10-CM | POA: Diagnosis not present

## 2023-08-28 DIAGNOSIS — R000 Tachycardia, unspecified: Secondary | ICD-10-CM | POA: Diagnosis not present

## 2023-08-28 DIAGNOSIS — E569 Vitamin deficiency, unspecified: Secondary | ICD-10-CM | POA: Diagnosis not present

## 2023-08-28 DIAGNOSIS — R350 Frequency of micturition: Secondary | ICD-10-CM | POA: Diagnosis not present

## 2023-08-28 DIAGNOSIS — R5383 Other fatigue: Secondary | ICD-10-CM | POA: Diagnosis not present

## 2023-09-03 DIAGNOSIS — F429 Obsessive-compulsive disorder, unspecified: Secondary | ICD-10-CM | POA: Diagnosis not present

## 2023-09-11 DIAGNOSIS — N3020 Other chronic cystitis without hematuria: Secondary | ICD-10-CM | POA: Diagnosis not present

## 2023-09-11 DIAGNOSIS — N3000 Acute cystitis without hematuria: Secondary | ICD-10-CM | POA: Diagnosis not present

## 2023-09-21 DIAGNOSIS — G4730 Sleep apnea, unspecified: Secondary | ICD-10-CM | POA: Diagnosis not present

## 2023-09-26 DIAGNOSIS — R102 Pelvic and perineal pain: Secondary | ICD-10-CM | POA: Diagnosis not present

## 2023-09-26 DIAGNOSIS — N3020 Other chronic cystitis without hematuria: Secondary | ICD-10-CM | POA: Diagnosis not present

## 2023-09-26 DIAGNOSIS — R3915 Urgency of urination: Secondary | ICD-10-CM | POA: Diagnosis not present

## 2023-09-26 DIAGNOSIS — R351 Nocturia: Secondary | ICD-10-CM | POA: Diagnosis not present

## 2023-09-26 DIAGNOSIS — R350 Frequency of micturition: Secondary | ICD-10-CM | POA: Diagnosis not present

## 2023-10-01 DIAGNOSIS — F429 Obsessive-compulsive disorder, unspecified: Secondary | ICD-10-CM | POA: Diagnosis not present

## 2023-10-23 DIAGNOSIS — F900 Attention-deficit hyperactivity disorder, predominantly inattentive type: Secondary | ICD-10-CM | POA: Diagnosis not present

## 2023-10-23 DIAGNOSIS — F3181 Bipolar II disorder: Secondary | ICD-10-CM | POA: Diagnosis not present

## 2023-11-14 ENCOUNTER — Ambulatory Visit: Payer: Medicare HMO | Admitting: Neurology

## 2023-11-14 ENCOUNTER — Encounter: Payer: Self-pay | Admitting: Neurology

## 2023-11-14 VITALS — BP 130/80 | HR 77 | Ht 64.0 in | Wt 195.0 lb

## 2023-11-14 DIAGNOSIS — R519 Headache, unspecified: Secondary | ICD-10-CM

## 2023-11-14 DIAGNOSIS — G43711 Chronic migraine without aura, intractable, with status migrainosus: Secondary | ICD-10-CM

## 2023-11-14 DIAGNOSIS — H8102 Meniere's disease, left ear: Secondary | ICD-10-CM | POA: Diagnosis not present

## 2023-11-14 DIAGNOSIS — G43009 Migraine without aura, not intractable, without status migrainosus: Secondary | ICD-10-CM | POA: Diagnosis not present

## 2023-11-14 DIAGNOSIS — R51 Headache with orthostatic component, not elsewhere classified: Secondary | ICD-10-CM

## 2023-11-14 DIAGNOSIS — H539 Unspecified visual disturbance: Secondary | ICD-10-CM

## 2023-11-14 MED ORDER — NURTEC 75 MG PO TBDP: 75.0000 mg | ORAL_TABLET | Freq: Every day | ORAL | 11 refills | Status: AC | PRN

## 2023-11-14 MED ORDER — AJOVY 225 MG/1.5ML ~~LOC~~ SOAJ: 225.0000 mg | SUBCUTANEOUS | 11 refills | Status: DC

## 2023-11-14 MED ORDER — NURTEC 75 MG PO TBDP: 75.0000 mg | ORAL_TABLET | Freq: Every day | ORAL | Status: DC | PRN

## 2023-11-14 MED ORDER — AJOVY 225 MG/1.5ML ~~LOC~~ SOAJ: 225.0000 mg | SUBCUTANEOUS | Status: DC

## 2023-11-14 NOTE — Progress Notes (Signed)
[facility]  2 beats clonus left AJ  Provider:  Dr Lucia Gaskins Requesting Provider: Kaleen Mask, * Primary Care Provider:  Kaleen Mask, MD  CC:  Headaches  HPI:  Gloria Rodriguez is a 47 y.o. female here as requested by Ellis Savage for migraines. has Sleep disorder; Family history of Hashimoto thyroiditis; Migraine; DOE (dyspnea on exertion); Obesity; Heartburn; Regurgitation and rechewing; Gastroesophageal reflux disease; and Morbid obesity (HCC) on their problem list.  Migraines diagnosed at 4. Mother and father had migraines. Nurtec helps tremendously. She is on Aimovig. Migraines are pulsating/pounding/throbbing, can be unilateral, no aura, she prodrom and the migraine can last days, nausea, motion sickness, light and sound sensitivity, brain fog, hurts to move, no medication overuse, she feels her cognition is impaired and she has word-finding difficulty, started the aimovig 2-3 years ago and also nurtec for rescue. She just had labs and she will get lab results from [place]. We will request.   Patient with chronic migraines. Prior to Aimovig she was having 8-12 moderate to severe migraine days a month and > 15 total headache days a month. On Aimovig she is having 5 migraine days a month and < 10 total headache days a month. AImovig is causing side effects will change to Ajovy. Migraines were every other day and had > 15 total headache days a month. Since on Aimovig 5 migraine days a month and < 10 total headache days a month. But she also has Vision changes, morning headaches, she can wake up with a headache and can be positional. She had a sleep study and had PLMS and has appointment with cardiology and she is working with primary care. She had an at home sleep study with mild sleep apnea and she is working on that with Ellis Savage. No other focal neurologic deficits, associated symptoms, inciting events or modifiable factors.   Reviewed notes, labs and  imaging from outside physicians, which showed:  From a thorough review of records, medications tried that can be used in migraine management include Tylenol, Aimovig, Excedrin Migraine, atenolol, baclofen, clomipramine (same class as amitriptyline or nortriptyline), Flexeril, Decadron, Benadryl, Migranal, gabapentin, ibuprofen, ketorolac injections, Lamictal, Keppra, Demerol, Robaxin, metoprolol, Reglan, Zofran, promethazine, Nurtec, Maxalt, Imitrex, amitriptyline and nortriptyline, Zomig, zonisamide, topiramate. She tried botox  2-3 sessions didn;t work. Nurtec works great. Emgality, She gets trigger point injections. Aimovig contraindicated due to IBS and constipation which she is experiencing and we will change to Ajovy.     04/08/2014:  Narrative  CLINICAL DATA:  47 year old female with worsening headaches. Initial encounter.  EXAM: MRI HEAD WITHOUT CONTRAST  TECHNIQUE: Multiplanar, multiecho pulse sequences of the brain and surrounding structures were obtained without intravenous contrast.  COMPARISON:  [place] [facility] temporal bone CT 03/06/2007.  FINDINGS: Cerebral volume is normal. No restricted diffusion to suggest acute infarction. No midline shift, mass effect, evidence of mass lesion, ventriculomegaly, extra-axial collection or acute intracranial hemorrhage. Cervicomedullary junction and pituitary are within normal limits. Negative visualized cervical spine. Major intracranial vascular flow voids are preserved. Wallace Cullens and white matter signal is within normal limits throughout the brain.  There is stable chronic prominence of the left vestibular aqueduct (series 8, image 4 today, series 2, image 100 in 2008). There is no associated left temporal bone mass identified. The right vestibular aqueduct appears normal. Other visible internal auditory structures are grossly normal. Mastoids are clear.  Visualized orbit soft tissues are within normal limits.  Negative paranasal sinuses. Normal bone marrow  signal. Negative scalp soft tissues.  IMPRESSION: 1.  Normal MRI appearance of the brain. 2. 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      Latest Ref Rng & Units 11/30/2020    4:49 AM 11/26/2020    9:13 AM 01/01/2019    1:15 PM  CBC  WBC 4.0 - 10.5 K/uL 11.1  9.1  8.9   Hemoglobin 12.0 - 15.0 g/dL 91.4  78.2  95.6   Hematocrit 36.0 - 46.0 % 36.9  41.1  40.6   Platelets 150 - 400 K/uL 221  248        Latest Ref Rng & Units 02/22/2021    9:34 AM 11/30/2020    4:49 AM 11/26/2020    9:13 AM  CMP  Glucose 65 - 99 mg/dL 65 - 99 mg/dL 64    64  213  92   BUN 7 - 25 mg/dL 7 - 25 mg/dL 15    15  11  23    Creatinine 0.50 - 1.10 mg/dL 0.86 - 5.78 mg/dL 4.69    6.29  5.28  4.13   Sodium 135 - 146 mmol/L 135 - 146 mmol/L 138    138  135  136   Potassium 3.5 - 5.3 mmol/L 3.5 - 5.3 mmol/L 4.0    4.0  3.7  4.4   Chloride 98 - 110 mmol/L 98 - 110 mmol/L 106    106  107  103   CO2 20 - 32 mmol/L 20 - 32 mmol/L 23    23  19  26    Calcium 8.6 - 10.2 mg/dL 8.6 - 24.4 mg/dL 9.5    9.5  7.8  9.5   Total Protein 6.1 - 8.1 g/dL 6.1 - 8.1 g/dL 6.7    6.7  6.6  7.7   Total Bilirubin 0.2 - 1.2 mg/dL 0.2 - 1.2 mg/dL 0.3    0.3  0.5  0.4   Alkaline Phos 38 - 126 U/L  61  78   AST 10 - 35 U/L 10 - 35 U/L 22    22  18  16    ALT 6 - 29 U/L 6 - 29 U/L 25    25  20  20      Review of Systems: Patient complains of symptoms per HPI as well as the following symptoms none. Pertinent negatives and positives per HPI. All others negative.   Social History   Socioeconomic History   Marital status: Married    Spouse name: Malisia Cowley   Number of children: 2   Years of education: Not on file   Highest education level: Not on file  Occupational History   Occupation: Disabled  Tobacco Use   Smoking status: Never   Smokeless tobacco: Never  Vaping Use   Vaping status: Never Used   Substance and Sexual Activity   Alcohol use: Not Currently   Drug use: Not Currently   Sexual activity: Yes    Partners: Male    Birth control/protection: I.U.D.  Other Topics Concern   Not on file  Social History Narrative   Caffeine: 1 cup tea/day   Social Determinants of Health   Financial Resource Strain: Not on file  Food Insecurity: No Food Insecurity (08/19/2020)   Hunger Vital Sign    Worried About Running Out of Food in the Last Year: Never true    Ran Out of Food in the Last Year: Never true  Transportation Needs: Not  on file  Physical Activity: Not on file  Stress: Not on file  Social Connections: Not on file  Intimate Partner Violence: Not on file    Family History  Problem Relation Age of Onset   Migraines Mother    Hypertension Mother    Hyperlipidemia Mother    Thyroid disease Mother    Depression Mother    Anxiety disorder Mother    Obesity Mother    Atrial fibrillation Mother    Migraines Father    Heart disease Maternal Grandmother    Prostate cancer Maternal Grandfather     Past Medical History:  Diagnosis Date   ADD (attention deficit disorder)    Allergy    allergy meds daily    Anxiety    hx bipolar, ADHD   Arthritis    left shoulder    Bruxism    Carpal tunnel syndrome    Chronic migraine    Depression    states well controlled   Diarrhea    Dyspnea    GERD (gastroesophageal reflux disease)    hx gallstones and pancreatitis per pt   Gluten intolerance    Headache(784.0)    History of kidney stones    HSV infection    on Valtrex suppression   Hypotension    normal rens 80/60   Interstitial cystitis    Lactose intolerance    No pertinent past medical history    states with gall bladder attacks with chest pain, vomiting and diarrhes    EKG  in EPIC   OCD (obsessive compulsive disorder)    Periodic limb movement    Postpartum care and examination of lactating mother 07/13/2011   Sleep disorder 03/03/2014   SOB (shortness of  breath)    Swelling     Patient Active Problem List   Diagnosis Date Noted   Morbid obesity (HCC) 11/29/2020   Heartburn    Regurgitation and rechewing    Gastroesophageal reflux disease    Obesity 09/20/2018   DOE (dyspnea on exertion) 09/19/2018   Migraine 12/28/2014   Family history of Hashimoto thyroiditis 04/27/2014   Sleep disorder 03/03/2014    Past Surgical History:  Procedure Laterality Date   48 HOUR PH STUDY N/A 10/15/2019   Procedure: 24 HOUR PH STUDY;  Surgeon: Tressia Danas, MD;  Location: [facility];  Service: Gastroenterology;  Laterality: N/A;   CESAREAN SECTION     CESAREAN SECTION  07/11/2011   Procedure: CESAREAN SECTION;  Surgeon: Genia Del;  Location: [facility];  Service: Gynecology;  Laterality: N/A;  Repeat   CHOLECYSTECTOMY  11/14/2011   Procedure: LAPAROSCOPIC CHOLECYSTECTOMY WITH INTRAOPERATIVE CHOLANGIOGRAM;  Surgeon: Clovis Pu. Cornett, MD;  Location: [facility];  Service: General;  Laterality: N/A;  Laparoscopic Cholecystectomy with cholangiogram, c-arm   COLONOSCOPY     ESOPHAGEAL MANOMETRY N/A 10/15/2019   Procedure: ESOPHAGEAL MANOMETRY (EM);  Surgeon: Tressia Danas, MD;  Location: [facility];  Service: Gastroenterology;  Laterality: N/A;   GASTRIC ROUX-EN-Y N/A 11/29/2020   Procedure: LAPAROSCOPIC ROUX-EN-Y GASTRIC BYPASS WITH UPPER ENDOSCOPY;  Surgeon: Berna Bue, MD;  Location: [facility];  Service: General;  Laterality: N/A;   LASIK     2007   mirena     insertion 08-09-2020   TEE WITHOUT CARDIOVERSION N/A 11/18/2013   Procedure: TRANSESOPHAGEAL ECHOCARDIOGRAM (TEE);  Surgeon: Pamella Pert, MD;  Location: [facility];  Service: Cardiovascular;  Laterality: N/A;   UPPER GI ENDOSCOPY N/A 11/29/2020   Procedure: UPPER GI ENDOSCOPY;  Surgeon: Phylliss Blakes  A, MD;  Location: [facility];  Service: General;  Laterality: N/A;   WISDOM TOOTH EXTRACTION      Current Outpatient Medications  Medication Sig Dispense Refill   ALPRAZolam  (XANAX) 0.25 MG tablet Take 0.125-0.25 mg by mouth daily as needed.     amphetamine-dextroamphetamine (ADDERALL XR) 30 MG 24 hr capsule Take 30 mg by mouth daily with breakfast.      amphetamine-dextroamphetamine (ADDERALL) 10 MG tablet Take 10 mg by mouth daily with breakfast.     clomiPRAMINE (ANAFRANIL) 50 MG capsule      EPINEPHrine 0.3 mg/0.3 mL IJ SOAJ injection Inject into the muscle as needed.     Fremanezumab-vfrm (AJOVY) 225 MG/1.5ML SOAJ Inject 225 mg into the skin every 30 (thirty) days.     Fremanezumab-vfrm (AJOVY) 225 MG/1.5ML SOAJ Inject 225 mg into the skin every 30 (thirty) days. 1.5 mL 11   hyoscyamine (LEVSIN SL) 0.125 MG SL tablet TAKE 1 TABLET (0.125 MG TOTAL) BY MOUTH EVERY 6 (SIX) HOURS AS NEEDED. *NOT COVERED (Patient taking differently: Take 0.125 mg by mouth every 6 (six) hours as needed for cramping.) 120 tablet 1   lactase (LACTAID) 3000 units tablet Take 3,000-6,000 Units by mouth 3 (three) times daily with meals as needed (lactose intolerance).     lamoTRIgine (LAMICTAL) 150 MG tablet Take 150 mg by mouth daily.     levocetirizine (XYZAL) 5 MG tablet Take 5 mg by mouth at bedtime.     levonorgestrel (MIRENA) 20 MCG/24HR IUD 1 each by Intrauterine route once.     Multiple Vitamins-Minerals (BARIATRIC MULTIVITAMINS PO) Take by mouth.     OVER THE COUNTER MEDICATION Trigger point injections in the scalp of  decadron , Lidocaine, marcaine -for chronic migraines as needed  Gets very 2 months     pramipexole (MIRAPEX) 1 MG tablet Take 1.5 mg by mouth at bedtime.     promethazine (PHENERGAN) 25 MG tablet Take 25 mg by mouth every 6 (six) hours as needed for nausea or vomiting (for migraine headaches).     sertraline (ZOLOFT) 100 MG tablet Take by mouth.     UNABLE TO FIND Med Name: Hyperbiotics Pro-women supplement suggested by urologist     Vibegron 75 MG TABS Take 75 mg by mouth daily.     Vitamin D, Ergocalciferol, (DRISDOL) 1.25 MG (50000 UNIT) CAPS capsule TAKE 1  CAPSULE (50,000 UNITS TOTAL) BY MOUTH EVERY 7 (SEVEN) DAYS. Oral for 28     zonisamide (ZONEGRAN) 100 MG capsule Take 100 mg by mouth daily. As needed  1   Rimegepant Sulfate (NURTEC) 75 MG TBDP Take 1 tablet (75 mg total) by mouth daily as needed. For migraines. Take as close to onset of migraine as possible. One daily maximum. 16 tablet 11   No current facility-administered medications for this visit.    Allergies as of 11/14/2023 - Review Complete 11/14/2023  Allergen Reaction Noted   Other Itching and Other (See Comments) 11/01/2011   Codeine Other (See Comments) 07/11/2011   Hydrocodone Other (See Comments) 07/11/2011   Oxycodone Other (See Comments) 07/11/2011   Ultram [tramadol hcl] Other (See Comments) 07/11/2011   Vicodin [hydrocodone-acetaminophen] Other (See Comments) 07/11/2011    Vitals: BP 130/80 (BP Location: Right Arm, Patient Position: Sitting)   Pulse 77   Ht 5\' 4"  (1.626 m)   Wt 195 lb (88.5 kg)   BMI 33.47 kg/m  Last Weight:  Wt Readings from Last 1 Encounters:  11/14/23 195 lb (88.5 kg)  Last Height:   Ht Readings from Last 1 Encounters:  11/14/23 5\' 4"  (1.626 m)     Physical exam: Exam: Gen: NAD, conversant, well nourised,  well groomed                     CV: RRR, no MRG. No Carotid Bruits. No peripheral edema, warm, nontender Eyes: Conjunctivae clear without exudates or hemorrhage  Neuro: Detailed Neurologic Exam  Speech:    Speech is normal; fluent and spontaneous with normal comprehension.  Cognition:    The patient is oriented to person, place, and time;     recent and remote memory intact;     language fluent;     normal attention, concentration,     fund of knowledge Cranial Nerves:    The pupils are equal, round, and reactive to light. The fundi are normal Visual fields are full to finger confrontation. Extraocular movements are intact. Trigeminal sensation is intact and the muscles of mastication are normal. The face is symmetric.  The palate elevates in the midline. Hearing intact. Voice is normal. Shoulder shrug is normal. The tongue has normal motion without fasciculations.   Coordination: nml  Gait: nml  Motor Observation:    No asymmetry, no atrophy, and no involuntary movements noted. Tone:    Normal muscle tone.    Posture:    Posture is normal. normal erect    Strength:    Strength is V/V in the upper and lower limbs.      Sensation: intact to LT     Reflex Exam:  DTR's:    Deep tendon reflexes in the upper and lower extremities are normal bilaterally.   Toes:    The toes are downgoing bilaterally.   Clonus:    Clonus is absent.    Assessment/Plan:  Patient with chronic migraines. Prior to Aimovig she was having 8-12 migraine days a month and > 15 total headache days a month. On Aimovig she is having 5 migraine days a month and < 10 total headache days a month. AImovig is causing side effects will change to Ajovy.  MRI of the brain w/wo contrast: MRI brain due to concerning symptoms of morning headaches, positional headaches,vision changes  to follow and recheck the abnormal endolymphatic sac that was seen on MRI in 2015 look for space occupying mass, chiari or intracranial hypertension (pseudotumor), strokes, malignancies, vasculidities, demyelination(multiple sclerosis) or other  Change from Aimovig to Ajovy: Migraine prevention  Nurtec 16 a month for acute management  Continue to follow with bariatric surgery   Orders Placed This Encounter  Procedures   MR BRAIN W WO CONTRAST   Meds ordered this encounter  Medications   DISCONTD: Rimegepant Sulfate (NURTEC) 75 MG TBDP    Sig: Take 1 tablet (75 mg total) by mouth daily as needed. For migraines. Take as close to onset of migraine as possible. One daily maximum.   Fremanezumab-vfrm (AJOVY) 225 MG/1.5ML SOAJ    Sig: Inject 225 mg into the skin every 30 (thirty) days.   Rimegepant Sulfate (NURTEC) 75 MG TBDP    Sig: Take 1 tablet (75  mg total) by mouth daily as needed. For migraines. Take as close to onset of migraine as possible. One daily maximum.    Dispense:  16 tablet    Refill:  11   Fremanezumab-vfrm (AJOVY) 225 MG/1.5ML SOAJ    Sig: Inject 225 mg into the skin every 30 (thirty) days.    Dispense:  1.5 mL  Refill:  11    Cc: Kaleen Mask, *,  Kaleen Mask, MD  Naomie Dean, MD  [facility] [address] [place], [place] [address]  Phone [phone] Fax [phone]

## 2023-11-14 NOTE — Patient Instructions (Addendum)
MRI of the brain w/wo contrast Change from Aimovig to Ajovy Nurtec 16 a month Continue to follow with bariatric surgery   Fremanezumab Injection What is this medication? FREMANEZUMAB (fre ma NEZ ue mab) prevents migraines. It works by blocking a substance in the body that causes migraines. It is a monoclonal antibody. This medicine may be used for other purposes; ask your health care provider or pharmacist if you have questions. COMMON BRAND NAME(S): AJOVY What should I tell my care team before I take this medication? They need to know if you have any of these conditions: An unusual or allergic reaction to fremanezumab, other medications, foods, dyes, or preservatives Pregnant or trying to get pregnant Breast-feeding How should I use this medication? This medication is injected under the skin. You will be taught how to prepare and give it. Take it as directed on the prescription label. Keep taking it unless your care team tells you to stop. It is important that you put your used needles and syringes in a special sharps container. Do not put them in a trash can. If you do not have a sharps container, call your pharmacist or care team to get one. Talk to your care team about the use of this medication in children. Special care may be needed. Overdosage: If you think you have taken too much of this medicine contact a poison control center or emergency room at once. NOTE: This medicine is only for you. Do not share this medicine with others. What if I miss a dose? If you miss a dose, take it as soon as you can. If it is almost time for your next dose, take only that dose. Do not take double or extra doses. What may interact with this medication? Interactions are not expected. This list may not describe all possible interactions. Give your health care provider a list of all the medicines, herbs, non-prescription drugs, or dietary supplements you use. Also tell them if you smoke, drink alcohol,  or use illegal drugs. Some items may interact with your medicine. What should I watch for while using this medication? Tell your care team if your symptoms do not start to get better or if they get worse. What side effects may I notice from receiving this medication? Side effects that you should report to your care team as soon as possible: Allergic reactions or angioedema--skin rash, itching or hives, swelling of the face, eyes, lips, tongue, arms, or legs, trouble swallowing or breathing Side effects that usually do not require medical attention (report to your care team if they continue or are bothersome): Pain, redness, or irritation at injection site This list may not describe all possible side effects. Call your doctor for medical advice about side effects. You may report side effects to FDA at 1-800-FDA-1088. Where should I keep my medication? Keep out of the reach of children and pets. Store in a refrigerator or at room temperature between 20 and 25 degrees C (68 and 77 degrees F). Refrigeration (preferred): Store in the refrigerator. Do not freeze. Keep in the original container until you are ready to take it. Remove the dose from the carton about 30 minutes before it is time for you to use it. If the dose is not used, it may be stored in the original container at room temperature for 7 days. Get rid of any unused medication after the expiration date. Room Temperature: This medication may be stored at room temperature for up to 7 days. Keep it in  the original container. Protect from light until time of use. If it is stored at room temperature, get rid of any unused medication after 7 days or after it expires, whichever is first. To get rid of medications that are no longer needed or have expired: Take the medication to a medication take-back program. Check with your pharmacy or law enforcement to find a location. If you cannot return the medication, ask your pharmacist or care team how to  get rid of this medication safely. NOTE: This sheet is a summary. It may not cover all possible information. If you have questions about this medicine, talk to your doctor, pharmacist, or health care provider.  2024 Elsevier/Gold Standard (2022-01-20 00:00:00)

## 2023-11-15 ENCOUNTER — Telehealth: Payer: Self-pay | Admitting: Neurology

## 2023-11-15 ENCOUNTER — Other Ambulatory Visit: Payer: Self-pay | Admitting: Neurology

## 2023-11-15 DIAGNOSIS — G43711 Chronic migraine without aura, intractable, with status migrainosus: Secondary | ICD-10-CM

## 2023-11-15 NOTE — Telephone Encounter (Signed)
sent to GI they obtain [employer] medicare auth [id number] 

## 2023-11-22 ENCOUNTER — Other Ambulatory Visit (HOSPITAL_COMMUNITY): Payer: Self-pay

## 2023-11-22 ENCOUNTER — Telehealth: Payer: Self-pay

## 2023-11-22 NOTE — Telephone Encounter (Signed)
I spoke with the patient and she said she tried propranolol but it didn't work and her BP dropped. She isn't sure if she's tried Divalproex Sodium   but she is on Lamictal already (for 13 years) to treat mood disorder and micromedex shows that to be contraindicated with Depacon. Can you select yes for each of those answers? Let me know if any other questions/concerns come up. Thank you!

## 2023-11-22 NOTE — Telephone Encounter (Signed)
      This insurance requires the PT has tried ALL alternatives-out of the alternatives these are the two that I do not see in the chart-please advise.

## 2023-11-26 NOTE — Telephone Encounter (Signed)
Pharmacy Patient Advocate Encounter   Received notification from Physician's Office that prior authorization for AJOVY (fremanezumab-vfrm) injection 225MG /1.5ML auto-injectors is required/requested.   Insurance verification completed.   The patient is insured through [facility] .   Per test claim: PA required; PA submitted to above mentioned insurance via CoverMyMeds Key/confirmation #/[id number] Status is pending

## 2023-11-27 ENCOUNTER — Other Ambulatory Visit (HOSPITAL_COMMUNITY): Payer: Self-pay

## 2023-11-27 NOTE — Telephone Encounter (Signed)
Pharmacy Patient Advocate Encounter  Received notification from [facility] that Prior Authorization for AJOVY (fremanezumab-vfrm) injection 225MG /1.5ML auto-injectors has been APPROVED from 12/11/2022 to 12/11/2023. Ran test claim, Copay is $0.00. This test claim was processed through [facility]- copay amounts may vary at other pharmacies due to pharmacy/plan contracts, or as the patient moves through the different stages of their insurance plan.   PA #/Case ID/Reference #: [id number]

## 2023-12-06 ENCOUNTER — Encounter: Payer: Self-pay | Admitting: Neurology

## 2023-12-13 ENCOUNTER — Ambulatory Visit
Admission: RE | Admit: 2023-12-13 | Discharge: 2023-12-13 | Disposition: A | Discharge status: Home / Self Care | Payer: Medicare HMO | Source: Ambulatory Visit | Attending: Neurology | Admitting: Neurology

## 2023-12-13 DIAGNOSIS — R51 Headache with orthostatic component, not elsewhere classified: Secondary | ICD-10-CM

## 2023-12-13 DIAGNOSIS — H539 Unspecified visual disturbance: Secondary | ICD-10-CM | POA: Diagnosis not present

## 2023-12-13 DIAGNOSIS — R519 Headache, unspecified: Secondary | ICD-10-CM

## 2023-12-13 DIAGNOSIS — H8102 Meniere's disease, left ear: Secondary | ICD-10-CM

## 2023-12-13 MED ORDER — GADOPICLENOL 0.5 MMOL/ML IV SOLN
10.0000 mL | Freq: Once | INTRAVENOUS | Status: AC | PRN
Start: 2023-12-13 — End: 2023-12-13
  Administered 2023-12-13: 9 mL via INTRAVENOUS

## 2023-12-17 ENCOUNTER — Encounter: Payer: Self-pay | Admitting: Neurology

## 2023-12-17 NOTE — Progress Notes (Signed)
 Your brain looks normal. No changes from 2015 from what they saw with the vestibular aqueduct which is great news. I fyou are having hearing loss you should follow with ENT doctor. But enlarged vestibular aqueducts can be genetic/hereditary.  If you want a referral to an ENt doctor please let us  know thanks. Happy Holidays! Dr. Ines

## 2023-12-24 ENCOUNTER — Ambulatory Visit: Payer: Medicare HMO | Admitting: Cardiology

## 2023-12-24 NOTE — Progress Notes (Deleted)
 Cardiology Office Note:  .   Date:  12/24/2023  ID:  Gloria Rodriguez, DOB 1976/10/19, MRN [id number] PCP: Loring Tanda Mae, MD  [facility] Providers Cardiologist:  None { Click to update primary MD,subspecialty MD or APP then REFRESH:1}  History of Present Illness: Gloria   Teja C Rodriguez is a 48 y.o. Patient with chronic migraines, dyspnea on exertion, morbid obesity, GERD referred to me for evaluation of elevated heart rate and dyspnea on exertion.  Discussed the use of AI scribe software for clinical note transcription with the patient, who gave verbal consent to proceed.  History of Present Illness             Labs   Lab Results  Component Value Date   CHOL 162 01/01/2019   HDL 66 01/01/2019   LDLCALC 70 01/01/2019   TRIG 129 01/01/2019   Lab Results  Component Value Date   NA 138 02/22/2021   NA 138 02/22/2021   K 4.0 02/22/2021   K 4.0 02/22/2021   CO2 23 02/22/2021   CO2 23 02/22/2021   GLUCOSE 64 (L) 02/22/2021   GLUCOSE 64 (L) 02/22/2021   BUN 15 02/22/2021   BUN 15 02/22/2021   CREATININE 0.68 02/22/2021   CREATININE 0.68 02/22/2021   CALCIUM 9.5 02/22/2021   CALCIUM 9.5 02/22/2021   GFRNONAA 106 02/22/2021      Latest Ref Rng & Units 02/22/2021    9:34 AM 11/30/2020    4:49 AM 11/26/2020    9:13 AM  BMP  Glucose 65 - 99 mg/dL 65 - 99 mg/dL 64    64  887  92   BUN 7 - 25 mg/dL 7 - 25 mg/dL 15    15  11  23    Creatinine 0.50 - 1.10 mg/dL 9.49 - 8.89 mg/dL 9.31    9.31  9.30  9.20   BUN/Creat Ratio 6 - 22 (calc) 6 - 22 (calc) NOT APPLICABLE    NOT APPLICABLE     Sodium 135 - 146 mmol/L 135 - 146 mmol/L 138    138  135  136   Potassium 3.5 - 5.3 mmol/L 3.5 - 5.3 mmol/L 4.0    4.0  3.7  4.4   Chloride 98 - 110 mmol/L 98 - 110 mmol/L 106    106  107  103   CO2 20 - 32 mmol/L 20 - 32 mmol/L 23    23  19  26    Calcium 8.6 - 10.2 mg/dL 8.6 - 89.7 mg/dL 9.5    9.5  7.8  9.5       Latest Ref Rng & Units 11/30/2020    4:49 AM  11/26/2020    9:13 AM 01/01/2019    1:15 PM  CBC  WBC 4.0 - 10.5 K/uL 11.1  9.1  8.9   Hemoglobin 12.0 - 15.0 g/dL 88.2  86.9  87.0   Hematocrit 36.0 - 46.0 % 36.9  41.1  40.6   Platelets 150 - 400 K/uL 221  248     External Labs:  Labs 08/28/2023:  Hb 14.0/HCT 45.5, platelets 240, normal indicis.  Serum glucose 89 mg, BUN 14, creatinine 0.84, EGFR 86 mL, potassium 4.1, LFTs normal.  Total cholesterol 167, triglycerides 62, HDL 91, LDL 64.  A1c 5.4%.  TSH normal at 2.560, T4 direct normal at 1.12.  ***ROS  Physical Exam:   VS:  There were no vitals taken for this visit.   Wt Readings from Last  3 Encounters:  11/14/23 88.5 kg  08/21/22 70.8 kg  07/27/22 71.7 kg     ***Physical Exam  Studies Reviewed: .    Echocardiogram 11/18/2013:  - Left ventricle: Systolic function was normal. Wall motion   was normal; there were no regional wall motion   abnormalities.  - Left atrium: No evidence of thrombus in the atrial cavity   or appendage.  - Right atrium: No evidence of thrombus in the atrial cavity   or appendage.  - Atrial septum: Echo contrast study showed no right-to-left   atrial level shunt, at baseline or with provocation.   Extended EKG monitoring 30 days starting 08/28/2023. Predominant rhythm was normal sinus rhythm.  There were 21 patient activated events revealing normal sinus rhythm versus sinus tachycardia.  Symptoms included dyspnea, fatigue, palpitations.  EKG:         ***  Medications and allergies    Allergies  Allergen Reactions   Other Itching and Other (See Comments)    Any pain med that is in a tablet form. Causes Itching, vomiting,migraine- is the sodium binding in the tablet form   Patient states ok for liquids, injections, just not in tablet form   Codeine Other (See Comments)    Migraines- can tolerate liquids   Hydrocodone  Other (See Comments)    migraines   Oxycodone  Other (See Comments)    migraines   Ultram [Tramadol Hcl] Other (See  Comments)    Migraines/nightmares/ STATES ANY SODIUM BINDING TABLET CAUSES ALLERGIC REACTION   Vicodin [Hydrocodone -Acetaminophen ] Other (See Comments)    migraines     Current Outpatient Medications:    ALPRAZolam  (XANAX ) 0.25 MG tablet, Take 0.125-0.25 mg by mouth daily as needed., Disp: , Rfl:    amphetamine-dextroamphetamine (ADDERALL XR) 30 MG 24 hr capsule, Take 30 mg by mouth daily with breakfast. , Disp: , Rfl:    amphetamine-dextroamphetamine (ADDERALL) 10 MG tablet, Take 10 mg by mouth daily with breakfast., Disp: , Rfl:    clomiPRAMINE  (ANAFRANIL ) 50 MG capsule, , Disp: , Rfl:    EPINEPHrine  0.3 mg/0.3 mL IJ SOAJ injection, Inject into the muscle as needed., Disp: , Rfl:    Fremanezumab -vfrm (AJOVY ) 225 MG/1.5ML SOAJ, Inject 225 mg into the skin every 30 (thirty) days., Disp: , Rfl:    Fremanezumab -vfrm (AJOVY ) 225 MG/1.5ML SOAJ, Inject 225 mg into the skin every 30 (thirty) days., Disp: 1.5 mL, Rfl: 11   hyoscyamine  (LEVSIN  SL) 0.125 MG SL tablet, TAKE 1 TABLET (0.125 MG TOTAL) BY MOUTH EVERY 6 (SIX) HOURS AS NEEDED. *NOT COVERED (Patient taking differently: Take 0.125 mg by mouth every 6 (six) hours as needed for cramping.), Disp: 120 tablet, Rfl: 1   lactase (LACTAID) 3000 units tablet, Take 3,000-6,000 Units by mouth 3 (three) times daily with meals as needed (lactose intolerance)., Disp: , Rfl:    lamoTRIgine  (LAMICTAL ) 150 MG tablet, Take 150 mg by mouth daily., Disp: , Rfl:    levocetirizine (XYZAL) 5 MG tablet, Take 5 mg by mouth at bedtime., Disp: , Rfl:    levonorgestrel  (MIRENA ) 20 MCG/24HR IUD, 1 each by Intrauterine route once., Disp: , Rfl:    Multiple Vitamins-Minerals (BARIATRIC MULTIVITAMINS PO), Take by mouth., Disp: , Rfl:    OVER THE COUNTER MEDICATION, Trigger point injections in the scalp of  decadron  , Lidocaine , marcaine  -for chronic migraines as needed  Gets very 2 months, Disp: , Rfl:    pramipexole  (MIRAPEX ) 1 MG tablet, Take 1.5 mg by mouth at bedtime.,  Disp: ,  Rfl:    promethazine  (PHENERGAN ) 25 MG tablet, Take 25 mg by mouth every 6 (six) hours as needed for nausea or vomiting (for migraine headaches)., Disp: , Rfl:    Rimegepant Sulfate  (NURTEC) 75 MG TBDP, Take 1 tablet (75 mg total) by mouth daily as needed. For migraines. Take as close to onset of migraine as possible. One daily maximum., Disp: 16 tablet, Rfl: 11   sertraline  (ZOLOFT ) 100 MG tablet, Take by mouth., Disp: , Rfl:    UNABLE TO FIND, Med Name: Hyperbiotics Pro-women supplement suggested by urologist, Disp: , Rfl:    Vibegron 75 MG TABS, Take 75 mg by mouth daily., Disp: , Rfl:    Vitamin D , Ergocalciferol , (DRISDOL ) 1.25 MG (50000 UNIT) CAPS capsule, TAKE 1 CAPSULE (50,000 UNITS TOTAL) BY MOUTH EVERY 7 (SEVEN) DAYS. Oral for 28, Disp: , Rfl:    zonisamide  (ZONEGRAN ) 100 MG capsule, Take 100 mg by mouth daily. As needed, Disp: , Rfl: 1   ASSESSMENT AND PLAN: .      ICD-10-CM   1. Palpitations  R00.2     2. Dyspnea on exertion  R06.09       1. Palpitations ***  2. Dyspnea on exertion ***  Assessment and Plan                {Are you ordering a CV Procedure (e.g. stress test, cath, DCCV, TEE, etc)?   Press F2        :[id number]}   Signed,  Gordy Bergamo, MD, [facility] 12/24/2023, 9:11 AM [facility] [address] [place], [place] [address] Phone: [phone]. Fax:  [phone]

## 2024-02-26 ENCOUNTER — Encounter: Payer: Self-pay | Admitting: Cardiology

## 2024-02-26 ENCOUNTER — Ambulatory Visit: Payer: Medicare HMO | Attending: Cardiology | Admitting: Cardiology

## 2024-02-26 VITALS — BP 124/78 | HR 91 | Resp 16 | Ht 64.0 in | Wt 201.4 lb

## 2024-02-26 DIAGNOSIS — R000 Tachycardia, unspecified: Secondary | ICD-10-CM

## 2024-02-26 DIAGNOSIS — G901 Familial dysautonomia [Riley-Day]: Secondary | ICD-10-CM | POA: Diagnosis not present

## 2024-02-26 DIAGNOSIS — G43711 Chronic migraine without aura, intractable, with status migrainosus: Secondary | ICD-10-CM

## 2024-02-26 NOTE — Progress Notes (Unsigned)
 Cardiology Office Note:  .   Date:  02/27/2024  ID:  Gloria Rodriguez, DOB 1976-09-22, MRN [id number] PCP: Kaleen Mask, MD  [facility] Providers Cardiologist:  Yates Decamp, MD   History of Present Illness: Marland Kitchen   Gloria Rodriguez is a 48 y.o. pleasant patient with history of anxiety, ADHD, GERD, history of bariatric surgery in 2021 referred to me for evaluation of palpitations/elevated heart rate noted on her smart watch. She also feels heart beating and "wooshing sound" in her ears sometimes  She wore an event monitor sometime in September 2024.  She was also evaluated by me in 2014 for PFO due to frequent migraine headaches.  TEE was normal.  Discussed the use of AI scribe software for clinical note transcription with the patient, who gave verbal consent to proceed.  History of Present Illness   The patient, with a past medical history of migraines, palpitations, and gastric bypass surgery, presents with concerns about an elevated heart rate detected by her Apple watch. The patient reports that these episodes of elevated heart rate often occur at rest and are not associated with any physical activity. She also describes a sensation of feeling her pulse a lot, which she describes as a pounding sensation, but not necessarily fast.  The patient has a long-standing history of migraines, which were initially diagnosed at the age of 32. The migraines had a period of remission during her teenage years but have since returned and have been worsening recently. The patient suspects that the worsening of the migraines might be related to menopause, as she has noticed a correlation with hormonal fluctuations. The migraines are severe enough to cause the patient to spend multiple days in bed and have led to the patient being on disability for the past seven years.  The patient also has a history of severe acid reflux, which led to her undergoing gastric bypass surgery. Since the surgery, the patient  reports that the acid reflux has significantly improved. However, she has gained about forty pounds over the past year, which she attributes to a decrease in physical activity due to the migraines.      Labs   Lab Results  Component Value Date   CHOL 162 01/01/2019   HDL 66 01/01/2019   LDLCALC 70 01/01/2019   TRIG 129 01/01/2019   Lab Results  Component Value Date   NA 138 02/22/2021   NA 138 02/22/2021   K 4.0 02/22/2021   K 4.0 02/22/2021   CO2 23 02/22/2021   CO2 23 02/22/2021   GLUCOSE 64 (L) 02/22/2021   GLUCOSE 64 (L) 02/22/2021   BUN 15 02/22/2021   BUN 15 02/22/2021   CREATININE 0.68 02/22/2021   CREATININE 0.68 02/22/2021   CALCIUM 9.5 02/22/2021   CALCIUM 9.5 02/22/2021   GFRNONAA 106 02/22/2021      Latest Ref Rng & Units 02/22/2021    9:34 AM 11/30/2020    4:49 AM 11/26/2020    9:13 AM  BMP  Glucose 65 - 99 mg/dL 65 - 99 mg/dL 64    64  409  92   BUN 7 - 25 mg/dL 7 - 25 mg/dL 15    15  11  23    Creatinine 0.50 - 1.10 mg/dL 8.11 - 9.14 mg/dL 7.82    9.56  2.13  0.86   BUN/Creat Ratio 6 - 22 (calc) 6 - 22 (calc) NOT APPLICABLE    NOT APPLICABLE     Sodium 135 -  146 mmol/L 135 - 146 mmol/L 138    138  135  136   Potassium 3.5 - 5.3 mmol/L 3.5 - 5.3 mmol/L 4.0    4.0  3.7  4.4   Chloride 98 - 110 mmol/L 98 - 110 mmol/L 106    106  107  103   CO2 20 - 32 mmol/L 20 - 32 mmol/L 23    23  19  26    Calcium 8.6 - 10.2 mg/dL 8.6 - 83.4 mg/dL 9.5    9.5  7.8  9.5       Latest Ref Rng & Units 11/30/2020    4:49 AM 11/26/2020    9:13 AM 01/01/2019    1:15 PM  CBC  WBC 4.0 - 10.5 K/uL 11.1  9.1  8.9   Hemoglobin 12.0 - 15.0 g/dL 19.6  22.2  97.9   Hematocrit 36.0 - 46.0 % 36.9  41.1  40.6   Platelets 150 - 400 K/uL 221  248     Lab Results  Component Value Date   HGBA1C 5.5 01/01/2019    Lab Results  Component Value Date   TSH 1.79 02/22/2021    External Labs:  Labs 08/11/2022: Normal CBC, CMP, lipids revealing total cholesterol 167,  triglycerides 62, HDL 91, LDL 64.  A1c 5.4%.  T4 normal at 1.12, TSH normal at 2.560.  Vitamin D42.2.  Review of Systems  Cardiovascular:  Negative for chest pain, dyspnea on exertion and leg swelling.  Gastrointestinal:  Positive for bloating.  Neurological:  Positive for headaches.   Physical Exam:   VS:  BP 124/78 (BP Location: Left Arm, Patient Position: Sitting, Cuff Size: Normal)   Pulse 91   Resp 16   Ht 5\' 4"  (1.626 m)   Wt 201 lb 6.4 oz (91.4 kg)   SpO2 98%   BMI 34.57 kg/m    Wt Readings from Last 3 Encounters:  02/26/24 201 lb 6.4 oz (91.4 kg)  11/14/23 195 lb (88.5 kg)  08/21/22 156 lb (70.8 kg)    Physical Exam Constitutional:      Appearance: She is obese.  Neck:     Vascular: No carotid bruit or JVD.  Cardiovascular:     Rate and Rhythm: Normal rate and regular rhythm.     Pulses: Intact distal pulses.     Heart sounds: Normal heart sounds. No murmur heard.    No gallop.  Pulmonary:     Effort: Pulmonary effort is normal.     Breath sounds: Normal breath sounds.  Abdominal:     General: Bowel sounds are normal.     Palpations: Abdomen is soft.  Musculoskeletal:     Right lower leg: No edema.     Left lower leg: No edema.    Studies Reviewed: Marland Kitchen    TEE 11/18/2013: - Left ventricle: Systolic function was normal. Wall motion    was normal; there were no regional wall motion    abnormalities.  - Left atrium: No evidence of thrombus in the atrial cavity   or appendage.  - Right atrium: No evidence of thrombus in the atrial cavity   or appendage.  - Atrial septum: Echo contrast study showed no right-to-left   atrial level shunt, at baseline or with provocation.   Extended EKG monitoring 30 days starting 08/28/2023: Predominant rhythm was normal sinus rhythm.  There were 21 triggered events,  2 auto triggered events = EKG revealing sinus tachycardia 19 patient activated events of shortness of breath,  fast heartbeat revealing normal sinus rhythm, sinus  bradycardia, sinus tachycardia. Rare PACs and PVCs, no atrial fibrillation, no heart block. EKG:    EKG Interpretation Date/Time:  Tuesday February 26 2024 16:05:14 EDT Ventricular Rate:  82 PR Interval:  150 QRS Duration:  80 QT Interval:  344 QTC Calculation: 401 R Axis:   11  Text Interpretation: EKG 02/26/2024: Normal sinus rhythm at rate of 82 bpm, normal EKG.  Compared to 07/05/2020, no change. Confirmed by Delrae Rend [phone]) on 02/26/2024 4:52:13 PM    Medications and allergies    Allergies  Allergen Reactions   Other Itching and Other (See Comments)    Any pain med that is in a tablet form. Causes Itching, vomiting,migraine- is the sodium binding in the tablet form   Patient states ok for liquids, injections, just not in tablet form   Codeine Other (See Comments)    Migraines- can tolerate liquids   Hydrocodone Other (See Comments)    migraines   Oxycodone Other (See Comments)    migraines   Ultram [Tramadol Hcl] Other (See Comments)    Migraines/nightmares/ STATES ANY SODIUM BINDING TABLET CAUSES ALLERGIC REACTION   Vicodin [Hydrocodone-Acetaminophen] Other (See Comments)    migraines     Current Outpatient Medications:    ALPRAZolam (XANAX) 0.25 MG tablet, Take 0.125-0.25 mg by mouth daily as needed., Disp: , Rfl:    amphetamine-dextroamphetamine (ADDERALL XR) 30 MG 24 hr capsule, Take 30 mg by mouth daily with breakfast. , Disp: , Rfl:    amphetamine-dextroamphetamine (ADDERALL) 10 MG tablet, Take 10 mg by mouth daily with breakfast., Disp: , Rfl:    EPINEPHrine 0.3 mg/0.3 mL IJ SOAJ injection, Inject into the muscle as needed., Disp: , Rfl:    Fremanezumab-vfrm (AJOVY) 225 MG/1.5ML SOAJ, Inject 225 mg into the skin every 30 (thirty) days., Disp: , Rfl:    hyoscyamine (LEVSIN SL) 0.125 MG SL tablet, TAKE 1 TABLET (0.125 MG TOTAL) BY MOUTH EVERY 6 (SIX) HOURS AS NEEDED. *NOT COVERED (Patient taking differently: Take 0.125 mg by mouth every 6 (six) hours as needed  for cramping.), Disp: 120 tablet, Rfl: 1   lactase (LACTAID) 3000 units tablet, Take 3,000-6,000 Units by mouth 3 (three) times daily with meals as needed (lactose intolerance)., Disp: , Rfl:    lamoTRIgine (LAMICTAL) 150 MG tablet, Take 150 mg by mouth daily., Disp: , Rfl:    levocetirizine (XYZAL) 5 MG tablet, Take 5 mg by mouth at bedtime., Disp: , Rfl:    levonorgestrel (MIRENA) 20 MCG/24HR IUD, 1 each by Intrauterine route once., Disp: , Rfl:    Multiple Vitamins-Minerals (BARIATRIC MULTIVITAMINS PO), Take by mouth., Disp: , Rfl:    OVER THE COUNTER MEDICATION, Trigger point injections in the scalp of  decadron , Lidocaine, marcaine -for chronic migraines as needed  Gets very 2 months, Disp: , Rfl:    promethazine (PHENERGAN) 25 MG tablet, Take 25 mg by mouth every 6 (six) hours as needed for nausea or vomiting (for migraine headaches)., Disp: , Rfl:    Rimegepant Sulfate (NURTEC) 75 MG TBDP, Take 1 tablet (75 mg total) by mouth daily as needed. For migraines. Take as close to onset of migraine as possible. One daily maximum., Disp: 16 tablet, Rfl: 11   sertraline (ZOLOFT) 100 MG tablet, Take by mouth., Disp: , Rfl:    Vibegron 75 MG TABS, Take 75 mg by mouth daily., Disp: , Rfl:    Vitamin D, Ergocalciferol, (DRISDOL) 1.25 MG (50000 UNIT) CAPS capsule, TAKE  1 CAPSULE (50,000 UNITS TOTAL) BY MOUTH EVERY 7 (SEVEN) DAYS. Oral for 28, Disp: , Rfl:    ASSESSMENT AND PLAN: .      ICD-10-CM   1. Sinus tachycardia  R00.0 EKG 12-Lead    2. Intractable chronic migraine without aura and with status migrainosus  G43.711     3. Dysautonomia (HCC)  G90.1      Assessment and Plan    Dysautonomia   She has mild form of dysautonomia with autonomic dysfunction, including elevated resting heart rate, palpitations, irritable bowel and migraines, likely exacerbated by menopausal hormonal fluctuations and weight changes. EKG and extended heart monitor show normal sinus rhythm with no significant  abnormalities. Dysautonomia is involuntary with no specific therapy available.   Could consider alternative medicine approaches such as Ayurveda, homeopathy, or acupuncture. Encourage high doses of B1, B6, and B12 vitamins due to her water solubility and potential benefits in dysautonomia. Increase vitamin D intake to 4000 IU daily and recheck levels in 3-6 months. Consider fish oil capsules if tolerated and no GI side effects, starting with one capsule with a major meal, but only after trialing B vitamins.  Omega-3 fatty acids have been shown to reduce arrhythmias.  Chronic Migraines   She experiences chronic daily migraines without aura, significantly impacting her quality of life and contributing to disability. These have worsened recently, possibly due to menopausal hormonal fluctuations. Migraines have been present since age 79, with periods of improvement and exacerbation. Previous treatments include various medications and propranolol, which was ineffective. Continue management with a neurologist. Consider alternative medicine approaches as discussed for dysautonomia.  Gastric Bypass   Her gastric bypass in December 2021 initially improved migraines but led to nutrient absorption challenges, including B12 and iron. Emphasize the importance of adequate nutrient intake post-surgery. Ensure adequate intake of multivitamins, including iron and B12, to address potential deficiencies due to malabsorption.  Vitamin D Deficiency   She has a vitamin D deficiency contributing to overall health issues. Current intake of 2000 IU daily is insufficient. Increase vitamin D intake to 4000 IU daily and recheck vitamin D levels in 3-6 months.     I will see her back PRN and she felt reassured.      Signed,  Yates Decamp, MD, [facility] 02/27/2024, 10:47 PM [facility] [address] [place], [place] [address] Phone: [phone]. Fax:  [phone]

## 2024-02-26 NOTE — Patient Instructions (Signed)

## 2024-04-08 ENCOUNTER — Encounter: Payer: Self-pay | Admitting: Neurology

## 2024-04-08 ENCOUNTER — Other Ambulatory Visit: Payer: Self-pay | Admitting: Neurology

## 2024-04-08 ENCOUNTER — Ambulatory Visit: Payer: Medicare HMO | Admitting: Neurology

## 2024-04-08 VITALS — BP 135/85 | HR 80 | Ht 65.0 in | Wt 205.0 lb

## 2024-04-08 DIAGNOSIS — G43709 Chronic migraine without aura, not intractable, without status migrainosus: Secondary | ICD-10-CM

## 2024-04-08 MED ORDER — TIMOLOL HEMIHYDRATE 0.5 % OP SOLN: 1.0000 [drp] | Freq: Two times a day (BID) | OPHTHALMIC | 12 refills | Status: AC | PRN

## 2024-04-08 MED ORDER — SODIUM CHLORIDE 0.9 % IV SOLN: 100.0000 mg | INTRAVENOUS | 4 refills | Status: AC

## 2024-04-08 MED ORDER — ZAVZPRET 10 MG/ACT NA SOLN
1.0000 | Freq: Every day | NASAL | Status: AC | PRN
Start: 2024-04-08 — End: ?

## 2024-04-08 NOTE — Progress Notes (Signed)
 [facility]  Provider:  Dr Tresia Fruit Requesting Provider: Candiss Chamorro, * Primary Care Provider:  Candiss Chamorro, MD  CC:  Headaches  04/08/2024: Ajovy  was approved 11/2023. Ajovy  did not help more with the migraines than aimovig did, she has > 8 migraine days a month and > 15 total headache days a month. She has also tried Emgality in the past. At this time can try Vyepti. We discussed vyepti and will try that. She only got 10 nurtec, could be insurance as I prescribed 16.  Get her vyepti approved. Can consider nurtec every other day if we cannot get the vyepti approved then can have 16 a month for every other day or if not approved on the vyepti can continue prn.   Patient complains of symptoms per HPI as well as the following symptoms: none . Pertinent negatives and positives per HPI. All others negative   HPI:  Genelda C Riffel is a 48 y.o. female here as requested by Linde Reveal for migraines. has Sleep disorder; Family history of Hashimoto thyroiditis; Migraine; DOE (dyspnea on exertion); Obesity; Heartburn; Regurgitation and rechewing; Gastroesophageal reflux disease; and Morbid obesity (HCC) on their problem list.  Migraines diagnosed at 4. Mother and father had migraines. Nurtec helps tremendously. She is on Aimovig. Migraines are pulsating/pounding/throbbing, can be unilateral, no aura, she prodrom and the migraine can last days, nausea, motion sickness, light and sound sensitivity, brain fog, hurts to move, no medication overuse, she feels her cognition is impaired and she has word-finding difficulty, started the aimovig 2-3 years ago and also nurtec for rescue. She just had labs and she will get lab results from [facility] to us . We will request.   Patient with chronic migraines. Prior to Aimovig she was having 8-12 moderate to severe migraine days a month and > 15 total headache days a month. On Aimovig she is having 5 migraine days a month and < 10 total  headache days a month. AImovig is causing side effects will change to Ajovy . Migraines were every other day and had > 15 total headache days a month. Since on Aimovig 5 migraine days a month and < 10 total headache days a month. But she also has Vision changes, morning headaches, she can wake up with a headache and can be positional. She had a sleep study and had PLMS and has appointment with cardiology and she is working with primary care. She had an at home sleep study with mild sleep apnea and she is working on that with Linde Reveal. No other focal neurologic deficits, associated symptoms, inciting events or modifiable factors.   Reviewed notes, labs and imaging from outside physicians, which showed:  From a thorough review of records, medications tried that can be used in migraine management include Tylenol , Aimovig, Excedrin Migraine, atenolol, baclofen, clomipramine  (same class as amitriptyline or nortriptyline), Flexeril, Decadron , Benadryl , Migranal, gabapentin , ibuprofen , ketorolac  injections, Lamictal , Keppra, Demerol , Robaxin , metoprolol , Reglan , Zofran , promethazine , Nurtec, Maxalt, Imitrex, amitriptyline and nortriptyline, Zomig, zonisamide , topiramate. She tried botox  2-3 sessions didn;t work. Nurtec works great. Emgality, She gets trigger point injections. Aimovig contraindicated due to IBS and constipation which she is experiencing and we will change to Ajovy .     04/08/2014:  Narrative  CLINICAL DATA:  48 year old female with worsening headaches. Initial encounter.  EXAM: MRI HEAD WITHOUT CONTRAST  TECHNIQUE: Multiplanar, multiecho pulse sequences of the brain and surrounding structures were obtained without intravenous contrast.  COMPARISON:  [place] [facility] temporal bone CT 03/06/2007.  FINDINGS: Cerebral volume is normal. No restricted diffusion to suggest acute infarction. No midline shift, mass effect, evidence of mass lesion, ventriculomegaly, extra-axial  collection or acute intracranial hemorrhage. Cervicomedullary junction and pituitary are within normal limits. Negative visualized cervical spine. Major intracranial vascular flow voids are preserved. Martina Sledge and white matter signal is within normal limits throughout the brain.  There is stable chronic prominence of the left vestibular aqueduct (series 8, image 4 today, series 2, image 100 in 2008). There is no associated left temporal bone mass identified. The right vestibular aqueduct appears normal. Other visible internal auditory structures are grossly normal. Mastoids are clear.  Visualized orbit soft tissues are within normal limits. Negative paranasal sinuses. Normal bone marrow signal. Negative scalp soft tissues.  IMPRESSION: 1.  Normal MRI appearance of the brain. 2. 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      Latest Ref Rng & Units 11/30/2020    4:49 AM 11/26/2020    9:13 AM 01/01/2019    1:15 PM  CBC  WBC 4.0 - 10.5 K/uL 11.1  9.1  8.9   Hemoglobin 12.0 - 15.0 g/dL 16.1  09.6  04.5   Hematocrit 36.0 - 46.0 % 36.9  41.1  40.6   Platelets 150 - 400 K/uL 221  248        Latest Ref Rng & Units 02/22/2021    9:34 AM 11/30/2020    4:49 AM 11/26/2020    9:13 AM  CMP  Glucose 65 - 99 mg/dL 65 - 99 mg/dL 64    64  409  92   BUN 7 - 25 mg/dL 7 - 25 mg/dL 15    15  11  23    Creatinine 0.50 - 1.10 mg/dL 8.11 - 9.14 mg/dL 7.82    9.56  2.13  0.86   Sodium 135 - 146 mmol/L 135 - 146 mmol/L 138    138  135  136   Potassium 3.5 - 5.3 mmol/L 3.5 - 5.3 mmol/L 4.0    4.0  3.7  4.4   Chloride 98 - 110 mmol/L 98 - 110 mmol/L 106    106  107  103   CO2 20 - 32 mmol/L 20 - 32 mmol/L 23    23  19  26    Calcium 8.6 - 10.2 mg/dL 8.6 - 57.8 mg/dL 9.5    9.5  7.8  9.5   Total Protein 6.1 - 8.1 g/dL 6.1 - 8.1 g/dL 6.7    6.7  6.6  7.7   Total Bilirubin 0.2 - 1.2 mg/dL 0.2 - 1.2 mg/dL 0.3    0.3  0.5  0.4    Alkaline Phos 38 - 126 U/L  61  78   AST 10 - 35 U/L 10 - 35 U/L 22    22  18  16    ALT 6 - 29 U/L 6 - 29 U/L 25    25  20  20      Review of Systems: Patient complains of symptoms per HPI as well as the following symptoms none. Pertinent negatives and positives per HPI. All others negative.   Social History   Socioeconomic History   Marital status: Married    Spouse name: Bernadean Parham   Number of children: 2   Years of education: Not on file   Highest education level: Not on file  Occupational History   Occupation: Disabled  Tobacco Use   Smoking status: Never  Smokeless tobacco: Never  Vaping Use   Vaping status: Never Used  Substance and Sexual Activity   Alcohol use: Not Currently   Drug use: Not Currently   Sexual activity: Yes    Partners: Male    Birth control/protection: I.U.D.  Other Topics Concern   Not on file  Social History Narrative   Caffeine: 1 cup tea/day   Social Drivers of Health   Financial Resource Strain: Not on file  Food Insecurity: No Food Insecurity (08/19/2020)   Hunger Vital Sign    Worried About Running Out of Food in the Last Year: Never true    Ran Out of Food in the Last Year: Never true  Transportation Needs: Not on file  Physical Activity: Not on file  Stress: Not on file  Social Connections: Not on file  Intimate Partner Violence: Not on file    Family History  Problem Relation Age of Onset   Migraines Mother    Hypertension Mother    Hyperlipidemia Mother    Thyroid  disease Mother    Depression Mother    Anxiety disorder Mother    Obesity Mother    Atrial fibrillation Mother    Migraines Father    Heart disease Maternal Grandmother    Prostate cancer Maternal Grandfather     Past Medical History:  Diagnosis Date   ADD (attention deficit disorder)    Allergy    allergy meds daily    Anxiety    hx bipolar, ADHD   Arthritis    left shoulder    Bruxism    Carpal tunnel syndrome    Chronic migraine     Depression    states well controlled   Diarrhea    Dyspnea    GERD (gastroesophageal reflux disease)    hx gallstones and pancreatitis per pt   Gluten intolerance    Headache(784.0)    History of kidney stones    HSV infection    on Valtrex  suppression   Hypotension    normal rens 80/60   Interstitial cystitis    Lactose intolerance    No pertinent past medical history    states with gall bladder attacks with chest pain, vomiting and diarrhes    EKG  in [facility]   OCD (obsessive compulsive disorder)    Periodic limb movement    Postpartum care and examination of lactating mother 07/13/2011   Sleep disorder 03/03/2014   SOB (shortness of breath)    Swelling     Patient Active Problem List   Diagnosis Date Noted   Morbid obesity (HCC) 11/29/2020   Heartburn    Regurgitation and rechewing    Gastroesophageal reflux disease    Obesity 09/20/2018   DOE (dyspnea on exertion) 09/19/2018   Migraine 12/28/2014   Family history of Hashimoto thyroiditis 04/27/2014   Sleep disorder 03/03/2014    Past Surgical History:  Procedure Laterality Date   44 HOUR PH STUDY N/A 10/15/2019   Procedure: 24 HOUR PH STUDY;  Surgeon: Lindle Rhea, MD;  Location: [facility];  Service: Gastroenterology;  Laterality: N/A;   CESAREAN SECTION     CESAREAN SECTION  07/11/2011   Procedure: CESAREAN SECTION;  Surgeon: Marie-Lyne Lavoie;  Location: [facility];  Service: Gynecology;  Laterality: N/A;  Repeat   CHOLECYSTECTOMY  11/14/2011   Procedure: LAPAROSCOPIC CHOLECYSTECTOMY WITH INTRAOPERATIVE CHOLANGIOGRAM;  Surgeon: Brandy Cal. Cornett, MD;  Location: [facility];  Service: General;  Laterality: N/A;  Laparoscopic Cholecystectomy with cholangiogram, c-arm   COLONOSCOPY  ESOPHAGEAL MANOMETRY N/A 10/15/2019   Procedure: ESOPHAGEAL MANOMETRY (EM);  Surgeon: Lindle Rhea, MD;  Location: [facility];  Service: Gastroenterology;  Laterality: N/A;   GASTRIC ROUX-EN-Y N/A 11/29/2020   Procedure:  LAPAROSCOPIC ROUX-EN-Y GASTRIC BYPASS WITH UPPER ENDOSCOPY;  Surgeon: Adalberto Acton, MD;  Location: [facility];  Service: General;  Laterality: N/A;   LASIK     2007   mirena      insertion 08-09-2020   TEE WITHOUT CARDIOVERSION N/A 11/18/2013   Procedure: TRANSESOPHAGEAL ECHOCARDIOGRAM (TEE);  Surgeon: Jessica Morn, MD;  Location: [facility];  Service: Cardiovascular;  Laterality: N/A;   UPPER GI ENDOSCOPY N/A 11/29/2020   Procedure: UPPER GI ENDOSCOPY;  Surgeon: Adalberto Acton, MD;  Location: [facility];  Service: General;  Laterality: N/A;   WISDOM TOOTH EXTRACTION      Current Outpatient Medications  Medication Sig Dispense Refill   ALPRAZolam  (XANAX ) 0.25 MG tablet Take 0.125-0.25 mg by mouth daily as needed.     amphetamine-dextroamphetamine (ADDERALL XR) 30 MG 24 hr capsule Take 30 mg by mouth daily with breakfast.      amphetamine-dextroamphetamine (ADDERALL) 10 MG tablet Take 10 mg by mouth daily with breakfast.     Fremanezumab -vfrm (AJOVY ) 225 MG/1.5ML SOAJ Inject 225 mg into the skin every 30 (thirty) days.     hyoscyamine  (LEVSIN SL) 0.125 MG SL tablet TAKE 1 TABLET (0.125 MG TOTAL) BY MOUTH EVERY 6 (SIX) HOURS AS NEEDED. *NOT COVERED (Patient taking differently: Take 0.125 mg by mouth every 6 (six) hours as needed for cramping.) 120 tablet 1   lactase (LACTAID) 3000 units tablet Take 3,000-6,000 Units by mouth 3 (three) times daily with meals as needed (lactose intolerance).     lamoTRIgine  (LAMICTAL ) 150 MG tablet Take 150 mg by mouth daily.     levocetirizine (XYZAL) 5 MG tablet Take 5 mg by mouth at bedtime.     levonorgestrel  (MIRENA ) 20 MCG/24HR IUD 1 each by Intrauterine route once.     Multiple Vitamins-Minerals (BARIATRIC MULTIVITAMINS PO) Take by mouth.     OVER THE COUNTER MEDICATION Trigger point injections in the scalp of  decadron  , Lidocaine , marcaine  -for chronic migraines as needed  Gets very 2 months     promethazine  (PHENERGAN ) 25 MG tablet Take 25 mg by  mouth every 6 (six) hours as needed for nausea or vomiting (for migraine headaches).     Rimegepant Sulfate  (NURTEC) 75 MG TBDP Take 1 tablet (75 mg total) by mouth daily as needed. For migraines. Take as close to onset of migraine as possible. One daily maximum. 16 tablet 11   sertraline  (ZOLOFT ) 100 MG tablet Take by mouth.     sodium chloride  0.9 % SOLN 100 mL with Eptinezumab-jjmr 100 MG/ML SOLN 100 mg Inject 100 mg into the vein every 3 (three) months. 100 mg 4   timolol (BETIMOL) 0.5 % ophthalmic solution Place 1 drop into both eyes 2 (two) times daily as needed. For migraine as needed. 10 mL 12   Vitamin D , Ergocalciferol , (DRISDOL ) 1.25 MG (50000 UNIT) CAPS capsule TAKE 1 CAPSULE (50,000 UNITS TOTAL) BY MOUTH EVERY 7 (SEVEN) DAYS. Oral for 28     Zavegepant HCl (ZAVZPRET) 10 MG/ACT SOLN Place 1 spray into the nose daily as needed. 2 each    EPINEPHrine  0.3 mg/0.3 mL IJ SOAJ injection Inject into the muscle as needed. (Patient not taking: Reported on 04/08/2024)     Vibegron 75 MG TABS Take 75 mg by mouth daily. (Patient not taking:  Reported on 04/08/2024)     No current facility-administered medications for this visit.    Allergies as of 04/08/2024 - Review Complete 04/08/2024  Allergen Reaction Noted   Other Itching and Other (See Comments) 11/01/2011   Protonix  [pantoprazole ] Other (See Comments) 04/08/2024   Codeine Other (See Comments) 07/11/2011   Hydrocodone  Other (See Comments) 07/11/2011   Oxycodone  Other (See Comments) 07/11/2011   Ultram [tramadol hcl] Other (See Comments) 07/11/2011   Vicodin [hydrocodone -acetaminophen ] Other (See Comments) 07/11/2011    Vitals: BP 135/85   Pulse 80   Ht 5\' 5"  (1.651 m)   Wt 205 lb (93 kg)   BMI 34.11 kg/m  Last Weight:  Wt Readings from Last 1 Encounters:  04/08/24 205 lb (93 kg)   Last Height:   Ht Readings from Last 1 Encounters:  04/08/24 5\' 5"  (1.651 m)     Physical exam: Exam: Gen: NAD, conversant      CV: No  palpitations or chest pain or SOB. VS: Breathing at a normal rate. Not febrile. Eyes: Conjunctivae clear without exudates or hemorrhage  Neuro: Detailed Neurologic Exam  Speech:    Speech is normal; fluent and spontaneous with normal comprehension.  Cognition:    The patient is oriented to person, place, and time;     recent and remote memory intact;     language fluent;     normal attention, concentration, fund of knowledge Cranial Nerves:    The pupils are equal, round, and reactive to light. Visual fields are full Extraocular movements are intact.  The face is symmetric with normal sensation. The palate elevates in the midline. Hearing intact. Voice is normal. Shoulder shrug is normal. The tongue has normal motion without fasciculations.   Coordination: normal  Gait:    No abnormalities noted or reported  Motor Observation:   no involuntary movements noted. Tone:    Appears normal  Posture:    Posture is normal. normal erect    Strength:    Strength is anti-gravity and symmetric in the upper and lower limbs.      Sensation: intact to LT, no reports of numbness or tingling or paresthesias        Assessment/Plan:  Patient with chronic migraines.  Ajovy  did not help more with the migraines than aimovig did, she has > 8 migraine days a month and > 15 total headache days a month. She has also tried [employer] in the past.   Start Vyepti protocol  MRI of the brain w/wo contrast: The brain has a normal appearance before and after contrast.   Nurtec 16 a month for acute management  Continue to follow with bariatric surgery  Has mild OSA and is working with pcp  Sees lisa poulos for psychiatry  Can try zavagepant prn(samples) and timolol eye drops as well acutely  No orders of the defined types were placed in this encounter.  Meds ordered this encounter  Medications   sodium chloride  0.9 % SOLN 100 mL with Eptinezumab-jjmr 100 MG/ML SOLN 100 mg    Sig: Inject 100  mg into the vein every 3 (three) months.    Dispense:  100 mg    Refill:  4   Zavegepant HCl (ZAVZPRET) 10 MG/ACT SOLN    Sig: Place 1 spray into the nose daily as needed.    Dispense:  2 each   timolol (BETIMOL) 0.5 % ophthalmic solution    Sig: Place 1 drop into both eyes 2 (two) times daily as needed. For  migraine as needed.    Dispense:  10 mL    Refill:  12    Cc: Candiss Chamorro, *,  Candiss Chamorro, MD  Aldona Amel, MD  [facility] [address] [place], [place] [address]  Phone [phone] Fax [phone]  I spent over 20 minutes of face-to-face and non-face-to-face time with patient on the  1. Chronic migraine without aura without status migrainosus, not intractable    diagnosis.  This included previsit chart review, lab review, study review, order entry, electronic health record documentation, patient education on the different diagnostic and therapeutic options, counseling and coordination of care, risks and benefits of management, compliance, or risk factor reduction

## 2024-04-08 NOTE — Patient Instructions (Addendum)
 Acutely: continue nurtec Timolol eye drops Zavzpret: like nurtec via nostrils Try nerivio Start vyepti  Nerivio      Timolol Eye Solution What is this medication? TIMOLOL (TIM oh lol) treats conditions with increased pressure of the eye, such as glaucoma. It works by decreasing the amount of fluid in the eye, which helps lower eye pressure. It belongs to a group of medications called beta-blockers. This medicine may be used for other purposes; ask your health care provider or pharmacist if you have questions. COMMON BRAND NAME(S): Betimol, Istalol, Timoptic, Timoptic Ocudose, Timoptic Ocumeter What should I tell my care team before I take this medication? They need to know if you have any of these conditions: Diabetes Having surgery Heart or blood vessel conditions, such as slow heartbeat, heart failure, heart block Lung or breathing disease, such as asthma or COPD Myasthenia gravis Thyroid  disease Wear contact lenses An unusual or allergic reaction to timolol, other medications, foods, dyes, or preservatives Pregnant or trying to get pregnant Breastfeeding How should I use this medication? This medication is only for use in the eye. Follow the directions on the prescription label. Wash hands before and after use. Tilt your head back slightly and pull your lower eyelid down with your index finger to form a pouch. Try not to touch the tip of the dropper or tube to your eye, fingertips, or other surfaces. Squeeze the prescribed number of drops into the pouch. Close the eye gently to spread the drops. Do not use your medication more often than directed. Do not stop using except on the advice of your care team. Talk to your care team about the use of this medication in children. While it may be prescribed for children as young as 2 years for selected conditions, precautions do apply. Overdosage: If you think you have taken too much of this medicine contact a poison control center or  emergency room at once. NOTE: This medicine is only for you. Do not share this medicine with others. What if I miss a dose? If you miss a dose, use it as soon as you can. If it is almost time for your next dose, use only that dose. Do not use double or extra doses. What may interact with this medication? Do not use other eye products without talking to your care team. This list may not describe all possible interactions. Give your health care provider a list of all the medicines, herbs, non-prescription drugs, or dietary supplements you use. Also tell them if you smoke, drink alcohol, or use illegal drugs. Some items may interact with your medicine. What should I watch for while using this medication? Visit your care team for regular checks on your progress. Tell your care team if your symptoms do not start to get better or if they get worse. If you are going to need surgery or a procedure, tell your care team that you are using this medication. Remove soft contact lenses before using these eye drops. You can put your contact lenses back in 15 minutes after you use the eye drops. What side effects may I notice from receiving this medication? Side effects that you should report to your care team as soon as possible: Allergic reactions--skin rash, itching, hives, swelling of the face, lips, tongue, or throat New or worsening eye pain, redness, irritation, or discharge Side effects that usually do not require medical attention (report to your care team if they continue or are bothersome): Blurry vision Headache Irritation at application  site This list may not describe all possible side effects. Call your doctor for medical advice about side effects. You may report side effects to FDA at 1-800-FDA-1088. Where should I keep my medication? Keep out of the reach of children and pets. Store at room temperature. Protect from light. See product label for storage information. Each product may have  different instructions. Get rid of any unused medication as instructed or after the expiration date, whichever is first. To get rid of medications that are no longer needed or have expired: Take the medication to a medication take-back program. Check with your pharmacy or law enforcement to find a location. If you cannot return the medication, check the label or package insert to see if the medication should be thrown out in the garbage or flushed down the toilet. If you are not sure, ask your care team. If it is safe to put it in the trash, empty the medication out of the container. Mix the medication with cat litter, dirt, coffee grounds, or other unwanted substance. Seal the mixture in a bag or container. Put it in the trash. NOTE: This sheet is a summary. It may not cover all possible information. If you have questions about this medicine, talk to your doctor, pharmacist, or health care provider.  2024 Elsevier/Gold Standard (2023-11-09 00:00:00)Zavegepant Nasal Spray What is this medication? ZAVEGEPANT (za VE je pant) treats migraines. It works by blocking a substance in the body that causes migraines. It is not used to prevent migraines. This medicine may be used for other purposes; ask your health care provider or pharmacist if you have questions. COMMON BRAND NAME(S): ZAVZPRET What should I tell my care team before I take this medication? They need to know if you have any of these conditions: Kidney disease Liver disease An unusual or allergic reaction to zavegepant, other medications, foods, dyes, or preservatives Pregnant or trying to get pregnant Breast-feeding How should I use this medication? This medication is for use in the nose. Take it as directed on the prescription label. Do not use it more often than directed. Make sure that you are using your nasal spray correctly. Ask your care team if you have any questions. Talk to your care team about the use of this medication in  children. Special care may be needed. Overdosage: If you think you have taken too much of this medicine contact a poison control center or emergency room at once. NOTE: This medicine is only for you. Do not share this medicine with others. What if I miss a dose? This does not apply. This medication is not for regular use. It should only be used as needed. What may interact with this medication? Decongestant nasal sprays This medication may affect how other medications work, and other medications may affect the way this medication works. Talk with your care team about all of the medications you take. They may suggest changes to your treatment plan to lower the risk of side effects and to make sure your medications work as intended. This list may not describe all possible interactions. Give your health care provider a list of all the medicines, herbs, non-prescription drugs, or dietary supplements you use. Also tell them if you smoke, drink alcohol, or use illegal drugs. Some items may interact with your medicine. What should I watch for while using this medication? Visit your care team for regular checks on your progress. Tell your care team if your symptoms do not start to get better or if  they get worse. What side effects may I notice from receiving this medication? Side effects that you should report to your care team as soon as possible: Allergic reactions--skin rash, itching, hives, swelling of the face, lips, tongue, or throat Side effects that usually do not require medical attention (report to your care team if they continue or are bothersome): Change in taste Dryness or irritation inside the nose Nausea Vomiting This list may not describe all possible side effects. Call your doctor for medical advice about side effects. You may report side effects to FDA at 1-800-FDA-1088. Where should I keep my medication? Keep out of the reach of children and pets. Store at room temperature between 20  and 25 degrees C (68 and 77 degrees F). Do not freeze. Get rid of any unused medication after the expiration date. To get rid of medications that are no longer needed or have expired: Take the medication to a medication take-back program. Check with your pharmacy or law enforcement to find a location. If you cannot return the medication, ask your pharmacist or care team how to get rid of this medication safely. NOTE: This sheet is a summary. It may not cover all possible information. If you have questions about this medicine, talk to your doctor, pharmacist, or health care provider.  2024 Elsevier/Gold Standard (2022-02-23 00:00:00)

## 2024-04-09 ENCOUNTER — Telehealth: Payer: Self-pay | Admitting: *Deleted

## 2024-04-09 NOTE — Telephone Encounter (Signed)
 Vyepti 100 mg IV q3 month order form completed and is pending Dr Harding Li signature.

## 2024-04-09 NOTE — Telephone Encounter (Signed)
-----   Message from Glory Larsen sent at 04/08/2024  6:03 PM EDT ----- Regarding: Please start Vyepti approval Please start vyepti approval 100mg  to start [id number] thank you

## 2024-04-14 NOTE — Telephone Encounter (Signed)
 Vyepti order signed and given to [facility].

## 2024-04-15 NOTE — Telephone Encounter (Signed)
 I called and spoke to Gloria Rodriguez.  I relayed that it was not going thru her insurance.  I checked on goodrx and she can get generic, not using insurance at other pharmacies at low cost.  She was ok with that.  I called [facility] and asked about prescription if was was written for was brand or generic, she was able to use brand they had and was no cost to Gloria Rodriguez.  I would let Gloria Rodriguez know.

## 2024-06-02 ENCOUNTER — Encounter: Payer: Self-pay | Admitting: Neurology

## 2024-08-04 ENCOUNTER — Telehealth (INDEPENDENT_AMBULATORY_CARE_PROVIDER_SITE_OTHER): Admitting: Neurology

## 2024-08-04 ENCOUNTER — Telehealth: Payer: Self-pay | Admitting: Neurology

## 2024-08-04 DIAGNOSIS — G43709 Chronic migraine without aura, not intractable, without status migrainosus: Secondary | ICD-10-CM | POA: Diagnosis not present

## 2024-08-04 NOTE — Telephone Encounter (Signed)
 LVM asking pt to call back to schedule follow up with NP

## 2024-08-04 NOTE — Telephone Encounter (Signed)
 Have her follow up with an NP in 6-7 months for migraines please thank you

## 2024-08-04 NOTE — Progress Notes (Signed)
 [facility]  Provider:  Dr Ines Requesting Provider: Loring Tanda Mae, * Primary Care Provider:  Loring Tanda Mae, MD  CC:  Headaches  Virtual Visit via Video Note  I connected with Aron C Crady on 08/04/24 at  2:00 PM EDT by a video enabled telemedicine application and verified that I am speaking with the correct person using two identifiers.  Location: Patient: Home Provider: office   I discussed the limitations of evaluation and management by telemedicine and the availability of in person appointments. The patient expressed understanding and agreed to proceed.   Follow Up Instructions:    I discussed the assessment and treatment plan with the patient. The patient was provided an opportunity to ask questions and all were answered. The patient agreed with the plan and demonstrated an understanding of the instructions.   The patient was advised to call back or seek an in-person evaluation if the symptoms worsen or if the condition fails to improve as anticipated.  I provided 30 minutes of non-face-to-face time during this encounter.   Onetha KATHEE Ines, MD   08/04/2024: She got the vyepti and she has noticed improvement, she feels improved, will do another round of 100mg  feel like not taking as many nurtec, taking 300mg  of gabaoentin a day. She is not using as much nurtec in a month, At baseline has daily headaches and daily  migraines, She has improved by >50% on the vyepti, she may only have 12 migraine days a month which is improved as she has not had to take all her nurtec.  She has been getting flashing lights, tingling and numbness and word finding which can happen with a migraine, she may have aura we discussed not using birth control. Can increase the Vyepti as needed. If she feels she wants to go to the 300mg  of vyepti can send us  a mychart. Discussed CBT for migraines.  Both parents had migraines, she was diagnosed at the age of 77.  Patient  complains of symptoms per HPI as well as the following symptoms: per hpi . Pertinent negatives and positives per HPI. All others negative. Discussed living with chronic pain, CBT, she goes to [facility] and has therapy regularly. To send a mychart and we can increase to 300mg  if feel improved but could get more improvement with a higher dose.  No other focal neurologic deficits, associated symptoms, inciting events or modifiable factors.   04/08/2024: Ajovy  was approved 11/2023. Ajovy  did not help more with the migraines than aimovig did, she has > 8 migraine days a month and > 15 total headache days a month. She has also tried Emgality in the past. At this time can try Vyepti. We discussed vyepti and will try that. She only got 10 nurtec, could be insurance as I prescribed 16.  Get her vyepti approved. Can consider nurtec every other day if we cannot get the vyepti approved then can have 16 a month for every other day or if not approved on the vyepti can continue prn.   Patient complains of symptoms per HPI as well as the following symptoms: none . Pertinent negatives and positives per HPI. All others negative   HPI:  Kinslie C Mays is a 48 y.o. female here as requested by [facility] for migraines. has Sleep disorder; Family history of Hashimoto thyroiditis; Migraine; DOE (dyspnea on exertion); Obesity; Heartburn; Regurgitation and rechewing; Gastroesophageal reflux disease; and Morbid obesity (HCC) on their problem list.  Migraines diagnosed at 4. Mother and  father had migraines. Nurtec helps tremendously. She is on Aimovig. Migraines are pulsating/pounding/throbbing, can be unilateral, no aura, she prodrom and the migraine can last days, nausea, motion sickness, light and sound sensitivity, brain fog, hurts to move, no medication overuse, she feels her cognition is impaired and she has word-finding difficulty, started the aimovig 2-3 years ago and also nurtec for rescue. She just had labs and she will  get lab results from [facility] to us . We will request.   Patient with chronic migraines. Prior to Aimovig she was having 8-12 moderate to severe migraine days a month and > 15 total headache days a month. On Aimovig she is having 5 migraine days a month and < 10 total headache days a month. AImovig is causing side effects will change to Ajovy . Migraines were every other day and had > 15 total headache days a month. Since on Aimovig 5 migraine days a month and < 10 total headache days a month. But she also has Vision changes, morning headaches, she can wake up with a headache and can be positional. She had a sleep study and had PLMS and has appointment with cardiology and she is working with primary care. She had an at home sleep study with mild sleep apnea and she is working on that with [facility]. No other focal neurologic deficits, associated symptoms, inciting events or modifiable factors.   Reviewed notes, labs and imaging from outside physicians, which showed:  From a thorough review of records, medications tried that can be used in migraine management include Tylenol , Aimovig, Excedrin Migraine, atenolol, baclofen, clomipramine  (same class as amitriptyline or nortriptyline), Flexeril, Decadron , Benadryl , Migranal, gabapentin , ibuprofen , ketorolac  injections, Lamictal , Keppra, Demerol , Robaxin , metoprolol , Reglan , Zofran , promethazine , Nurtec, Maxalt, Imitrex, amitriptyline and nortriptyline, Zomig, zonisamide , topiramate. She tried botox  2-3 sessions didn;t work. Nurtec works great. Emgality, She gets trigger point injections. Aimovig contraindicated due to IBS and constipation which she is experiencing and we will change to Ajovy .     04/08/2014:  Narrative  CLINICAL DATA:  48 year old female with worsening headaches. Initial encounter.  EXAM: MRI HEAD WITHOUT CONTRAST  TECHNIQUE: Multiplanar, multiecho pulse sequences of the brain and surrounding structures were obtained without  intravenous contrast.  COMPARISON:  [place] [facility] temporal bone CT 03/06/2007.  FINDINGS: Cerebral volume is normal. No restricted diffusion to suggest acute infarction. No midline shift, mass effect, evidence of mass lesion, ventriculomegaly, extra-axial collection or acute intracranial hemorrhage. Cervicomedullary junction and pituitary are within normal limits. Negative visualized cervical spine. Major intracranial vascular flow voids are preserved. El[place] and white matter signal is within normal limits throughout the brain.  There is stable chronic prominence of the left vestibular aqueduct (series 8, image 4 today, series 2, image 100 in 2008). There is no associated left temporal bone mass identified. The right vestibular aqueduct appears normal. Other visible internal auditory structures are grossly normal. Mastoids are clear.  Visualized orbit soft tissues are within normal limits. Negative paranasal sinuses. Normal bone marrow signal. Negative scalp soft tissues.  IMPRESSION: 1.  Normal MRI appearance of the brain. 2. 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      Latest Ref Rng & Units 11/30/2020    4:49 AM 11/26/2020    9:13 AM 01/01/2019    1:15 PM  CBC  WBC 4.0 - 10.5 K/uL 11.1  9.1  8.9   Hemoglobin 12.0 - 15.0 g/dL 88.2  86.9  87.0   Hematocrit 36.0 -  46.0 % 36.9  41.1  40.6   Platelets 150 - 400 K/uL 221  248        Latest Ref Rng & Units 02/22/2021    9:34 AM 11/30/2020    4:49 AM 11/26/2020    9:13 AM  CMP  Glucose 65 - 99 mg/dL 65 - 99 mg/dL 64    64  887  92   BUN 7 - 25 mg/dL 7 - 25 mg/dL 15    15  11  23    Creatinine 0.50 - 1.10 mg/dL 9.49 - 8.89 mg/dL 9.31    9.31  9.30  9.20   Sodium 135 - 146 mmol/L 135 - 146 mmol/L 138    138  135  136   Potassium 3.5 - 5.3 mmol/L 3.5 - 5.3 mmol/L 4.0    4.0  3.7  4.4   Chloride 98 - 110 mmol/L 98 - 110 mmol/L 106    106  107  103    CO2 20 - 32 mmol/L 20 - 32 mmol/L 23    23  19  26    Calcium 8.6 - 10.2 mg/dL 8.6 - 89.7 mg/dL 9.5    9.5  7.8  9.5   Total Protein 6.1 - 8.1 g/dL 6.1 - 8.1 g/dL 6.7    6.7  6.6  7.7   Total Bilirubin 0.2 - 1.2 mg/dL 0.2 - 1.2 mg/dL 0.3    0.3  0.5  0.4   Alkaline Phos 38 - 126 U/L  61  78   AST 10 - 35 U/L 10 - 35 U/L 22    22  18  16    ALT 6 - 29 U/L 6 - 29 U/L 25    25  20  20      Review of Systems: Patient complains of symptoms per HPI as well as the following symptoms none. Pertinent negatives and positives per HPI. All others negative.   Social History   Socioeconomic History   Marital status: Married    Spouse name: Lakeasha Petion   Number of children: 2   Years of education: Not on file   Highest education level: Not on file  Occupational History   Occupation: Disabled  Tobacco Use   Smoking status: Never   Smokeless tobacco: Never  Vaping Use   Vaping status: Never Used  Substance and Sexual Activity   Alcohol use: Not Currently   Drug use: Not Currently   Sexual activity: Yes    Partners: Male    Birth control/protection: I.U.D.  Other Topics Concern   Not on file  Social History Narrative   Caffeine: 1 cup tea/day   Social Drivers of Health   Financial Resource Strain: Not on file  Food Insecurity: No Food Insecurity (08/19/2020)   Hunger Vital Sign    Worried About Running Out of Food in the Last Year: Never true    Ran Out of Food in the Last Year: Never true  Transportation Needs: Not on file  Physical Activity: Not on file  Stress: Not on file  Social Connections: Not on file  Intimate Partner Violence: Not on file    Family History  Problem Relation Age of Onset   Migraines Mother    Hypertension Mother    Hyperlipidemia Mother    Thyroid  disease Mother    Depression Mother    Anxiety disorder Mother    Obesity Mother    Atrial fibrillation Mother    Migraines Father  Heart disease Maternal Grandmother    Prostate cancer  Maternal Grandfather     Past Medical History:  Diagnosis Date   ADD (attention deficit disorder)    Allergy    allergy meds daily    Anxiety    hx bipolar, ADHD   Arthritis    left shoulder    Bruxism    Carpal tunnel syndrome    Chronic migraine    Depression    states well controlled   Diarrhea    Dyspnea    GERD (gastroesophageal reflux disease)    hx gallstones and pancreatitis per pt   Gluten intolerance    Headache(784.0)    History of kidney stones    HSV infection    on Valtrex  suppression   Hypotension    normal rens 80/60   Interstitial cystitis    Lactose intolerance    No pertinent past medical history    states with gall bladder attacks with chest pain, vomiting and diarrhes    EKG  in EPIC   OCD (obsessive compulsive disorder)    Periodic limb movement    Postpartum care and examination of lactating mother 07/13/2011   Sleep disorder 03/03/2014   SOB (shortness of breath)    Swelling     Patient Active Problem List   Diagnosis Date Noted   Morbid obesity (HCC) 11/29/2020   Heartburn    Regurgitation and rechewing    Gastroesophageal reflux disease    Obesity 09/20/2018   DOE (dyspnea on exertion) 09/19/2018   Migraine 12/28/2014   Family history of Hashimoto thyroiditis 04/27/2014   Sleep disorder 03/03/2014    Past Surgical History:  Procedure Laterality Date   4 HOUR PH STUDY N/A 10/15/2019   Procedure: 24 HOUR PH STUDY;  Surgeon: Eda Iha, MD;  Location: [facility];  Service: Gastroenterology;  Laterality: N/A;   CESAREAN SECTION     CESAREAN SECTION  07/11/2011   Procedure: CESAREAN SECTION;  Surgeon: Marie-Lyne Lavoie;  Location: [facility];  Service: Gynecology;  Laterality: N/A;  Repeat   CHOLECYSTECTOMY  11/14/2011   Procedure: LAPAROSCOPIC CHOLECYSTECTOMY WITH INTRAOPERATIVE CHOLANGIOGRAM;  Surgeon: Debby LABOR. Cornett, MD;  Location: [facility];  Service: General;  Laterality: N/A;  Laparoscopic Cholecystectomy with cholangiogram,  c-arm   COLONOSCOPY     ESOPHAGEAL MANOMETRY N/A 10/15/2019   Procedure: ESOPHAGEAL MANOMETRY (EM);  Surgeon: Eda Iha, MD;  Location: [facility];  Service: Gastroenterology;  Laterality: N/A;   GASTRIC ROUX-EN-Y N/A 11/29/2020   Procedure: LAPAROSCOPIC ROUX-EN-Y GASTRIC BYPASS WITH UPPER ENDOSCOPY;  Surgeon: Signe Mitzie LABOR, MD;  Location: [facility];  Service: General;  Laterality: N/A;   LASIK     2007   mirena      insertion 08-09-2020   TEE WITHOUT CARDIOVERSION N/A 11/18/2013   Procedure: TRANSESOPHAGEAL ECHOCARDIOGRAM (TEE);  Surgeon: Erick JONELLE Bergamo, MD;  Location: [facility];  Service: Cardiovascular;  Laterality: N/A;   UPPER GI ENDOSCOPY N/A 11/29/2020   Procedure: UPPER GI ENDOSCOPY;  Surgeon: Signe Mitzie LABOR, MD;  Location: [facility];  Service: General;  Laterality: N/A;   WISDOM TOOTH EXTRACTION      Current Outpatient Medications  Medication Sig Dispense Refill   ALPRAZolam  (XANAX ) 0.25 MG tablet Take 0.125-0.25 mg by mouth daily as needed.     amphetamine-dextroamphetamine (ADDERALL XR) 30 MG 24 hr capsule Take 30 mg by mouth daily with breakfast.      amphetamine-dextroamphetamine (ADDERALL) 10 MG tablet Take 10 mg by mouth daily with breakfast.  Fremanezumab -vfrm (AJOVY ) 225 MG/1.5ML SOAJ Inject 225 mg into the skin every 30 (thirty) days.     hyoscyamine  (LEVSIN  SL) 0.125 MG SL tablet TAKE 1 TABLET (0.125 MG TOTAL) BY MOUTH EVERY 6 (SIX) HOURS AS NEEDED. *NOT COVERED (Patient taking differently: Take 0.125 mg by mouth every 6 (six) hours as needed for cramping.) 120 tablet 1   lactase (LACTAID) 3000 units tablet Take 3,000-6,000 Units by mouth 3 (three) times daily with meals as needed (lactose intolerance).     lamoTRIgine  (LAMICTAL ) 150 MG tablet Take 150 mg by mouth daily.     levocetirizine (XYZAL) 5 MG tablet Take 5 mg by mouth at bedtime.     levonorgestrel  (MIRENA ) 20 MCG/24HR IUD 1 each by Intrauterine route once.     Multiple Vitamins-Minerals  (BARIATRIC MULTIVITAMINS PO) Take by mouth.     OVER THE COUNTER MEDICATION Trigger point injections in the scalp of  decadron  , Lidocaine , marcaine  -for chronic migraines as needed  Gets very 2 months     promethazine  (PHENERGAN ) 25 MG tablet Take 25 mg by mouth every 6 (six) hours as needed for nausea or vomiting (for migraine headaches).     Rimegepant Sulfate  (NURTEC) 75 MG TBDP Take 1 tablet (75 mg total) by mouth daily as needed. For migraines. Take as close to onset of migraine as possible. One daily maximum. 16 tablet 11   sertraline  (ZOLOFT ) 100 MG tablet Take by mouth.     sodium chloride  0.9 % SOLN 100 mL with Eptinezumab-jjmr 100 MG/ML SOLN 100 mg Inject 100 mg into the vein every 3 (three) months. 100 mg 4   timolol  (BETIMOL ) 0.5 % ophthalmic solution Place 1 drop into both eyes 2 (two) times daily as needed. For migraine as needed. 10 mL 12   Vitamin D , Ergocalciferol , (DRISDOL ) 1.25 MG (50000 UNIT) CAPS capsule TAKE 1 CAPSULE (50,000 UNITS TOTAL) BY MOUTH EVERY 7 (SEVEN) DAYS. Oral for 28     Zavegepant HCl (ZAVZPRET ) 10 MG/ACT SOLN Place 1 spray into the nose daily as needed. 2 each    No current facility-administered medications for this visit.    Allergies as of 08/04/2024 - Review Complete 08/04/2024  Allergen Reaction Noted   Other Itching and Other (See Comments) 11/01/2011   Protonix  [pantoprazole ] Other (See Comments) 04/08/2024   Codeine Other (See Comments) 07/11/2011   Hydrocodone  Other (See Comments) 07/11/2011   Oxycodone  Other (See Comments) 07/11/2011   Ultram [tramadol hcl] Other (See Comments) 07/11/2011   Vicodin [hydrocodone -acetaminophen ] Other (See Comments) 07/11/2011    Vitals: There were no vitals taken for this visit. Last Weight:  Wt Readings from Last 1 Encounters:  04/08/24 205 lb (93 kg)   Last Height:   Ht Readings from Last 1 Encounters:  04/08/24 5' 5 (1.651 m)   Physical exam: Exam: Gen: NAD, conversant      CV: No palpitations or  chest pain or SOB. VS: Breathing at a normal rate. Not febrile. Eyes: Conjunctivae clear without exudates or hemorrhage  Neuro: Detailed Neurologic Exam  Speech:    Speech is normal; fluent and spontaneous with normal comprehension.  Cognition:    The patient is oriented to person, place, and time;     recent and remote memory intact;     language fluent;     normal attention, concentration, fund of knowledge Cranial Nerves:    The pupils are equal, round, and reactive to light. Visual fields are full Extraocular movements are intact.  The face is symmetric  with normal sensation. The palate elevates in the midline. Hearing intact. Voice is normal. Shoulder shrug is normal. The tongue has normal motion without fasciculations.   Coordination: normal  Gait:    No abnormalities noted or reported  Motor Observation:   no involuntary movements noted. Tone:    Appears normal  Posture:    Posture is normal. normal erect    Strength:    Strength is anti-gravity and symmetric in the upper and lower limbs.      Sensation: intact to LT, no reports of numbness or tingling or paresthesias         Assessment/Plan:  Patient with chronic migraines.  Ajovy  did not help more with the migraines than aimovig did, she has 12 migraine days a month and >15 total headache days a month. She has also tried [employer] in the past.   Start Vyepti protocol, doing better on 100mg . Will continue to the next infusion. If she wants to increase to 300mg  she can mychart use  MRI of the brain w/wo contrast: The brain has a normal appearance before and after contrast. 12/31/2023  Nurtec 16 a month for acute management, she hasn;t used as many this month which is great, she was having almost daily migraines prior   Continue to follow with bariatric surgery  Has mild OSA and is working with pcp  [facility]  Can try zavagepant prn(samples) and timolol  eye drops as well acutely in the  future  Discussed migraines and chronic pain , CBT, other therapy,  May have auras, discussed, if so discussed no estrogen due to increased risk of stroke, she will monitor after we had a discussion about aura  No orders of the defined types were placed in this encounter.  No orders of the defined types were placed in this encounter.   Cc: Loring Tanda Mae, *,  Loring Tanda Mae, MD  Onetha Epp, MD  [facility] [address] [place], [place] [address]  Phone [phone] Fax [phone]  I spent 30 minutes of face-to-face and non-face-to-face time with patient on the  1. Chronic migraine without aura without status migrainosus, not intractable     diagnosis.  This included previsit chart review, lab review, study review, order entry, electronic health record documentation, patient education on the different diagnostic and therapeutic options, counseling and coordination of care, risks and benefits of management, compliance, or risk factor reduction

## 2024-08-22 ENCOUNTER — Encounter: Payer: Self-pay | Admitting: Neurology

## 2024-08-25 ENCOUNTER — Other Ambulatory Visit: Payer: Self-pay | Admitting: Family Medicine

## 2024-08-25 DIAGNOSIS — Z1231 Encounter for screening mammogram for malignant neoplasm of breast: Secondary | ICD-10-CM

## 2024-08-28 ENCOUNTER — Ambulatory Visit (HOSPITAL_BASED_OUTPATIENT_CLINIC_OR_DEPARTMENT_OTHER): Admitting: Orthopaedic Surgery

## 2024-09-03 ENCOUNTER — Encounter: Payer: Self-pay | Admitting: Podiatry

## 2024-09-03 ENCOUNTER — Ambulatory Visit: Admitting: Podiatry

## 2024-09-03 ENCOUNTER — Ambulatory Visit (INDEPENDENT_AMBULATORY_CARE_PROVIDER_SITE_OTHER)

## 2024-09-03 ENCOUNTER — Ambulatory Visit
Admission: RE | Admit: 2024-09-03 | Discharge: 2024-09-03 | Disposition: A | Discharge status: Home / Self Care | Source: Ambulatory Visit

## 2024-09-03 DIAGNOSIS — M7742 Metatarsalgia, left foot: Secondary | ICD-10-CM

## 2024-09-03 DIAGNOSIS — Z1231 Encounter for screening mammogram for malignant neoplasm of breast: Secondary | ICD-10-CM

## 2024-09-03 DIAGNOSIS — M7989 Other specified soft tissue disorders: Secondary | ICD-10-CM | POA: Diagnosis not present

## 2024-09-03 NOTE — Progress Notes (Signed)
  Subjective:  Patient ID: Gloria Rodriguez, female    DOB: 1976/05/11,   MRN: [id number]  Chief Complaint  Patient presents with   Foot Pain    I have a lump on the bottom of my left foot.  Years ago, I had Plantar Warts in that area.  I don't know if that has anything to do with it. (4th met)    48 y.o. female presents for concern of lump on the bottom of her left foot that has been present for about a months. Relates it feels hard and notices it in certain shoes and has been becoming more painful. Has a history of plantar warts but this feels different. Denies any treatments.  Denies any other pedal complaints. Denies n/v/f/c.   Past Medical History:  Diagnosis Date   ADD (attention deficit disorder)    Allergy    allergy meds daily    Anxiety    hx bipolar, ADHD   Arthritis    left shoulder    Bruxism    Carpal tunnel syndrome    Chronic migraine    Depression    states well controlled   Diarrhea    Dyspnea    GERD (gastroesophageal reflux disease)    hx gallstones and pancreatitis per pt   Gluten intolerance    Headache(784.0)    History of kidney stones    HSV infection    on Valtrex  suppression   Hypotension    normal rens 80/60   Interstitial cystitis    Lactose intolerance    No pertinent past medical history    states with gall bladder attacks with chest pain, vomiting and diarrhes    EKG  in EPIC   OCD (obsessive compulsive disorder)    Periodic limb movement    Postpartum care and examination of lactating mother 07/13/2011   Sleep disorder 03/03/2014   SOB (shortness of breath)    Swelling     Objective:  Physical Exam: Vascular: DP/PT pulses 2/4 bilateral. CFT <3 seconds. Normal hair growth on digits. No edema.  Skin. No lacerations or abrasions bilateral feet. Fluctuant but slighlty firm mass noted in fourth interspace area just lateral to fourth metatarsal head area. Noticiable edema in area of third through fifth metatarsal head on left compared to right  foot. Tender to palpation of mass. Slighlty mobile in area.  Musculoskeletal: MMT 5/5 bilateral lower extremities in DF, PF, Inversion and Eversion. Deceased ROM in DF of ankle joint.  Neurological: Sensation intact to light touch.   Assessment:   1. Soft tissue mass   2. Metatarsalgia of left foot      Plan:  Patient was evaluated and treated and all questions answered. X-rays reviewed and discussed with patient. No acute fractures or dislcoations. No osseous deformities noted. Soft tissue edema noted in fourth and fifth metatarsal head areas.  Discussed masses of foot discussed possible cyst vs neuroma vs fibroma and treatment options with the patient. Would like to further evaluate to rule out any aggressive lesions given worsening in past months.  MRI ordered to further evaluated.  Metatarsal padding provided to offload area.  Patient to follow-up after MRI.   Asberry Failing, DPM

## 2024-09-04 ENCOUNTER — Encounter: Payer: Self-pay | Admitting: Podiatry

## 2024-09-15 ENCOUNTER — Ambulatory Visit
Admission: RE | Admit: 2024-09-15 | Discharge: 2024-09-15 | Disposition: A | Discharge status: Home / Self Care | Source: Ambulatory Visit | Attending: Podiatry | Admitting: Podiatry

## 2024-09-15 DIAGNOSIS — M7989 Other specified soft tissue disorders: Secondary | ICD-10-CM

## 2024-09-29 NOTE — Progress Notes (Incomplete)
 48 y.o. G17P2002 female here for annual exam. Married. PCP: Loring Tanda Mae, MD   No LMP recorded. (Menstrual status: IUD).    She reports ***. Urine sample provided: ***  Abnormal bleeding: *** Pelvic discharge or pain: *** Breast mass, nipple discharge or skin changes : ***  Sexually active: *** Birth control: IUD Last PAP:     Component Value Date/Time   DIAGPAP  08/21/2022 1411    - Negative for intraepithelial lesion or malignancy (NILM)   ADEQPAP  08/21/2022 1411    Satisfactory for evaluation; transformation zone component PRESENT.   Last mammogram: 09/03/24 density b, birads 1 neg Last colonoscopy: 02/20/19 62yr recall  Exercising: *** Smoker: ***  Flowsheet Row Nutrition from 08/19/2020 in [facility] Nutr Diab Ed  - A Dept Of [facility]  PHQ-2 Total Score 0    Flowsheet Row Office Visit from 02/12/2019 in [facility] Healthy Weight & Wellness at [facility] Total Score 12     GYN HISTORY: ***  OB History  Gravida Para Term Preterm AB Living  2 2 2  0 0 2  SAB IAB Ectopic Multiple Live Births  0 0 0 0 2    # Outcome Date GA Lbr Len/2nd Weight Sex Type Anes PTL Lv  2 Term 07/11/11 [redacted]w[redacted]d  5 lb 3.4 oz (2.365 kg) F CS-LTranv EPI  LIV  1 Term 01/2001 [redacted]w[redacted]d  7 lb 13 oz (3.544 kg) F CS-LTranv Spinal N LIV     Birth Comments: c/s for HSV outbreak   Past Medical History:  Diagnosis Date   ADD (attention deficit disorder)    Allergy    allergy meds daily    Anxiety    hx bipolar, ADHD   Arthritis    left shoulder    Bruxism    Carpal tunnel syndrome    Chronic migraine    Depression    states well controlled   Diarrhea    Dyspnea    GERD (gastroesophageal reflux disease)    hx gallstones and pancreatitis per pt   Gluten intolerance    Headache(784.0)    History of kidney stones    HSV infection    on Valtrex  suppression   Hypotension    normal rens 80/60   Interstitial cystitis    Lactose intolerance    No  pertinent past medical history    states with gall bladder attacks with chest pain, vomiting and diarrhes    EKG  in EPIC   OCD (obsessive compulsive disorder)    Periodic limb movement    Postpartum care and examination of lactating mother 07/13/2011   Sleep disorder 03/03/2014   SOB (shortness of breath)    Swelling    Past Surgical History:  Procedure Laterality Date   68 HOUR PH STUDY N/A 10/15/2019   Procedure: 24 HOUR PH STUDY;  Surgeon: Eda Iha, MD;  Location: [facility];  Service: Gastroenterology;  Laterality: N/A;   BREAST BIOPSY Left 10/10/2022   FIBROADENOMA WITH FOCAL USUAL DUCTAL HYPERPLASIA   CESAREAN SECTION     CESAREAN SECTION  07/11/2011   Procedure: CESAREAN SECTION;  Surgeon: Marie-Lyne Lavoie;  Location: [facility];  Service: Gynecology;  Laterality: N/A;  Repeat   CHOLECYSTECTOMY  11/14/2011   Procedure: LAPAROSCOPIC CHOLECYSTECTOMY WITH INTRAOPERATIVE CHOLANGIOGRAM;  Surgeon: Debby LABOR. Cornett, MD;  Location: [facility];  Service: General;  Laterality: N/A;  Laparoscopic Cholecystectomy with cholangiogram, c-arm   COLONOSCOPY     ESOPHAGEAL MANOMETRY N/A  10/15/2019   Procedure: ESOPHAGEAL MANOMETRY (EM);  Surgeon: Eda Iha, MD;  Location: [facility];  Service: Gastroenterology;  Laterality: N/A;   GASTRIC ROUX-EN-Y N/A 11/29/2020   Procedure: LAPAROSCOPIC ROUX-EN-Y GASTRIC BYPASS WITH UPPER ENDOSCOPY;  Surgeon: Signe Mitzie LABOR, MD;  Location: [facility];  Service: General;  Laterality: N/A;   LASIK     2007   mirena      insertion 08-09-2020   TEE WITHOUT CARDIOVERSION N/A 11/18/2013   Procedure: TRANSESOPHAGEAL ECHOCARDIOGRAM (TEE);  Surgeon: Erick JONELLE Bergamo, MD;  Location: [facility] ENDOSCOPY;  Service: Cardiovascular;  Laterality: N/A;   UPPER GI ENDOSCOPY N/A 11/29/2020   Procedure: UPPER GI ENDOSCOPY;  Surgeon: Signe Mitzie LABOR, MD;  Location: [facility];  Service: General;  Laterality: N/A;   WISDOM TOOTH EXTRACTION     Current Outpatient  Medications on File Prior to Visit  Medication Sig Dispense Refill   ALPRAZolam  (XANAX ) 0.25 MG tablet Take 0.125-0.25 mg by mouth daily as needed.     amphetamine-dextroamphetamine (ADDERALL XR) 30 MG 24 hr capsule Take 30 mg by mouth daily with breakfast.      amphetamine-dextroamphetamine (ADDERALL) 10 MG tablet Take 10 mg by mouth daily with breakfast.     Fremanezumab -vfrm (AJOVY ) 225 MG/1.5ML SOAJ Inject 225 mg into the skin every 30 (thirty) days.     hyoscyamine  (LEVSIN  SL) 0.125 MG SL tablet TAKE 1 TABLET (0.125 MG TOTAL) BY MOUTH EVERY 6 (SIX) HOURS AS NEEDED. *NOT COVERED (Patient taking differently: Take 0.125 mg by mouth every 6 (six) hours as needed for cramping.) 120 tablet 1   lactase (LACTAID) 3000 units tablet Take 3,000-6,000 Units by mouth 3 (three) times daily with meals as needed (lactose intolerance).     lamoTRIgine  (LAMICTAL ) 150 MG tablet Take 150 mg by mouth daily.     levocetirizine (XYZAL) 5 MG tablet Take 5 mg by mouth at bedtime.     levonorgestrel  (MIRENA ) 20 MCG/24HR IUD 1 each by Intrauterine route once.     Multiple Vitamins-Minerals (BARIATRIC MULTIVITAMINS PO) Take by mouth.     OVER THE COUNTER MEDICATION Trigger point injections in the scalp of  decadron  , Lidocaine , marcaine  -for chronic migraines as needed  Gets very 2 months     promethazine  (PHENERGAN ) 25 MG tablet Take 25 mg by mouth every 6 (six) hours as needed for nausea or vomiting (for migraine headaches).     Rimegepant Sulfate  (NURTEC) 75 MG TBDP Take 1 tablet (75 mg total) by mouth daily as needed. For migraines. Take as close to onset of migraine as possible. One daily maximum. 16 tablet 11   sertraline  (ZOLOFT ) 100 MG tablet Take by mouth.     sodium chloride  0.9 % SOLN 100 mL with Eptinezumab-jjmr 100 MG/ML SOLN 100 mg Inject 100 mg into the vein every 3 (three) months. 100 mg 4   timolol  (BETIMOL ) 0.5 % ophthalmic solution Place 1 drop into both eyes 2 (two) times daily as needed. For migraine  as needed. 10 mL 12   Vitamin D , Ergocalciferol , (DRISDOL ) 1.25 MG (50000 UNIT) CAPS capsule TAKE 1 CAPSULE (50,000 UNITS TOTAL) BY MOUTH EVERY 7 (SEVEN) DAYS. Oral for 28     Zavegepant HCl (ZAVZPRET ) 10 MG/ACT SOLN Place 1 spray into the nose daily as needed. 2 each    [DISCONTINUED] gabapentin  (NEURONTIN ) 100 MG capsule Take 2 capsules (200 mg total) by mouth every 12 (twelve) hours. 12 capsule 0   [DISCONTINUED] pantoprazole  (PROTONIX ) 40 MG tablet Take 1 tablet (40 mg total)  by mouth daily. 30 tablet 1   No current facility-administered medications on file prior to visit.   Social History   Socioeconomic History   Marital status: Married    Spouse name: Marianne Golightly   Number of children: 2   Years of education: Not on file   Highest education level: Not on file  Occupational History   Occupation: Disabled  Tobacco Use   Smoking status: Never   Smokeless tobacco: Never  Vaping Use   Vaping status: Never Used  Substance and Sexual Activity   Alcohol use: Not Currently   Drug use: Not Currently   Sexual activity: Yes    Partners: Male    Birth control/protection: I.U.D.  Other Topics Concern   Not on file  Social History Narrative   Caffeine: 1 cup tea/day   Social Drivers of Health   Financial Resource Strain: Not on file  Food Insecurity: No Food Insecurity (08/19/2020)   Hunger Vital Sign    Worried About Running Out of Food in the Last Year: Never true    Ran Out of Food in the Last Year: Never true  Transportation Needs: Not on file  Physical Activity: Not on file  Stress: Not on file  Social Connections: Not on file  Intimate Partner Violence: Not on file   Family History  Problem Relation Age of Onset   Migraines Mother    Hypertension Mother    Hyperlipidemia Mother    Thyroid  disease Mother    Depression Mother    Anxiety disorder Mother    Obesity Mother    Atrial fibrillation Mother    Migraines Father    Heart disease Maternal Grandmother     Prostate cancer Maternal Grandfather    Allergies  Allergen Reactions   Other Itching and Other (See Comments)    Any pain med that is in a tablet form. Causes Itching, vomiting,migraine- is the sodium binding in the tablet form   Patient states ok for liquids, injections, just not in tablet form   Protonix  [Pantoprazole ] Other (See Comments)    GI Issues All Proton pump inhibitors    Codeine Other (See Comments)    Migraines- can tolerate liquids   Hydrocodone  Other (See Comments)    migraines   Oxycodone  Other (See Comments)    migraines   Ultram [Tramadol Hcl] Other (See Comments)    Migraines/nightmares/ STATES ANY SODIUM BINDING TABLET CAUSES ALLERGIC REACTION   Vicodin [Hydrocodone -Acetaminophen ] Other (See Comments)    migraines     PE There were no vitals filed for this visit. There is no height or weight on file to calculate BMI.  Physical Exam    Assessment and Plan:        There are no diagnoses linked to this encounter. Clotilda FORBES Pa, CMA

## 2024-09-30 ENCOUNTER — Encounter: Admitting: Obstetrics and Gynecology

## 2024-10-07 ENCOUNTER — Encounter: Payer: Self-pay | Admitting: Podiatry

## 2024-10-07 ENCOUNTER — Ambulatory Visit: Admitting: Podiatry

## 2024-10-07 ENCOUNTER — Other Ambulatory Visit: Payer: Self-pay | Admitting: Podiatry

## 2024-10-07 DIAGNOSIS — M7989 Other specified soft tissue disorders: Secondary | ICD-10-CM

## 2024-10-07 NOTE — Progress Notes (Addendum)
 Subjective:  Patient ID: SAVANAH BAYLES, female    DOB: 10/23/1976,   MRN: [id number]  Chief Complaint  Patient presents with   Foot Pain    It's still the same.  It causes pain when I walk, especially when I don't wear shoes.  It hurts with certain shoes.    48 y.o. female presents for  follow-up of left soft tissue mass and to discuss MRI results. Relates still sores on the bottom and not any better with padding.   Denies any other pedal complaints. Denies n/v/f/c.   Past Medical History:  Diagnosis Date   ADD (attention deficit disorder)    Allergy    allergy meds daily    Anxiety    hx bipolar, ADHD   Arthritis    left shoulder    Bruxism    Carpal tunnel syndrome    Chronic migraine    Depression    states well controlled   Diarrhea    Dyspnea    GERD (gastroesophageal reflux disease)    hx gallstones and pancreatitis per pt   Gluten intolerance    Headache(784.0)    History of kidney stones    HSV infection    on Valtrex  suppression   Hypotension    normal rens 80/60   Interstitial cystitis    Lactose intolerance    No pertinent past medical history    states with gall bladder attacks with chest pain, vomiting and diarrhes    EKG  in EPIC   OCD (obsessive compulsive disorder)    Periodic limb movement    Postpartum care and examination of lactating mother 07/13/2011   Sleep disorder 03/03/2014   SOB (shortness of breath)    Swelling     Objective:  Physical Exam: Vascular: DP/PT pulses 2/4 bilateral. CFT <3 seconds. Normal hair growth on digits. No edema.  Skin. No lacerations or abrasions bilateral feet. Fluctuant but slighlty firm mass noted in fourth interspace area just lateral to fourth metatarsal head area. Noticiable edema in area of third through fifth metatarsal head on left compared to right foot. Tender to palpation of mass. Slighlty mobile in area.  Musculoskeletal: MMT 5/5 bilateral lower extremities in DF, PF, Inversion and Eversion. Deceased  ROM in DF of ankle joint.  Neurological: Sensation intact to light touch.   IMPRESSION: 1. Findings most compatible with a 6 x 8 x 3 mm ganglion/synovial cyst within the subcutaneous soft tissues of the plantar lateral forefoot, interposed between the plantar aponeurosis and the flexor tendons of the fourth metatarsal, at the level of the fourth metatarsal head. 2. Mild subcortical cystic change of the plantar aspect of the first metatarsal head at the articulation with the bipartite medial hallux sesamoid.   Assessment:   1. Soft tissue mass       Plan:  Patient was evaluated and treated and all questions answered. X-rays reviewed and discussed with patient. No acute fractures or dislcoations. No osseous deformities noted. Soft tissue edema noted in fourth and fifth metatarsal head areas.  Discussed masses of foot discussed possible cyst vs neuroma vs fibroma and treatment options with the patient. MRI reviewed and discussed with patient.  Discussed presence of possible ganglion cyst. Discussed options as far as aspiration and surgical removal.  Patient would like to attempt aspiration today.  Procedure below. Was unable to remove significant amount of cyst during procedure and noted thickened cellular material in aspirate which was sent off for cytology to be analyzed. Discussed with patient  surgical options for removal and patient is in agreement that she would like to have it removed.  Discussed soft tissue mass excision in detail perioperative course. -Informed surgical risk consent was reviewed and read aloud to the patient.  I reviewed the films.  I have discussed my findings with the patient in great detail.  I have discussed all risks including but not limited to infection, stiffness, scarring, limp, disability, deformity, damage to blood vessels and nerves, numbness, poor healing, need for braces, arthritis, chronic pain, amputation, death.  All benefits and realistic  expectations discussed in great detail.  I have made no promises as to the outcome.  I have provided realistic expectations.  I have offered the patient a 2nd opinion, which they have declined and assured me they preferred to proceed despite the risks. Will plan for surgery towards end of November. Meds: Zofran , oxycodone  04/12/2024 (liquid form)     Procedure: Aspiration cyst, left fourth metatarsal plantar Discussed alternatives, risks, complications and verbal consent was obtained.  Location: Left fourth metatarsal plantar Skin Prep: Alcohol. Injectate: 3 cc 1 % lidocaine .  Aspirated cyst with 18 gauge needle, removal of thickened cellular like material and hemorrhagic material about 0.5 cc  disposition: Patient tolerated procedure well. Injection site dressed with a band-aid. Compression bandage applied.  Post-procedure care was discussed and return precautions discussed.    Asberry Failing, DPM

## 2024-10-07 NOTE — Addendum Note (Signed)
 Addended by: WAYLAN ELIDIA PARAS on: 10/07/2024 11:52 AM   Modules accepted: Orders

## 2024-10-13 ENCOUNTER — Encounter: Payer: Self-pay | Admitting: Radiology

## 2024-10-14 ENCOUNTER — Telehealth: Payer: Self-pay | Admitting: Podiatry

## 2024-10-14 ENCOUNTER — Ambulatory Visit: Admitting: Podiatry

## 2024-10-14 ENCOUNTER — Encounter: Payer: Self-pay | Admitting: Podiatry

## 2024-10-14 VITALS — BP 155/94 | HR 89 | Temp 99.5°F

## 2024-10-14 DIAGNOSIS — M7989 Other specified soft tissue disorders: Secondary | ICD-10-CM

## 2024-10-14 NOTE — Progress Notes (Addendum)
 Subjective:  Patient ID: Gloria Rodriguez, female    DOB: 12-Nov-1976,   MRN: [id number]  Chief Complaint  Patient presents with   Routine Post Op    POV#1   DOS 10/07/2024 SOFT TISSUE MASS REMOVAL It's okay, a little sore.  I have not had any nausea, vomiting, chills or fever.  My pain level is a one to two.  It doesn't hurt unless I flex it or step on it.    48 y.o. female presents for  follow-up of left soft tissue mass .  Here for follow-up post aspiration of cyst.  Relates doing well no issues.  She is scheduled for surgery end of November.  Denies any other pedal complaints. Denies n/v/f/c.   Past Medical History:  Diagnosis Date   ADD (attention deficit disorder)    Allergy    allergy meds daily    Anxiety    hx bipolar, ADHD   Arthritis    left shoulder    Bruxism    Carpal tunnel syndrome    Chronic migraine    Depression    states well controlled   Diarrhea    Dyspnea    GERD (gastroesophageal reflux disease)    hx gallstones and pancreatitis per pt   Gluten intolerance    Headache(784.0)    History of kidney stones    HSV infection    on Valtrex  suppression   Hypotension    normal rens 80/60   Interstitial cystitis    Lactose intolerance    No pertinent past medical history    states with gall bladder attacks with chest pain, vomiting and diarrhes    EKG  in EPIC   OCD (obsessive compulsive disorder)    Periodic limb movement    Postpartum care and examination of lactating mother 07/13/2011   Sleep disorder 03/03/2014   SOB (shortness of breath)    Swelling     Objective:  Physical Exam: Vascular: DP/PT pulses 2/4 bilateral. CFT <3 seconds. Normal hair growth on digits. No edema.  Skin. No lacerations or abrasions bilateral feet. Fluctuant but slighlty firm mass noted in fourth interspace area just lateral to fourth metatarsal head area. Noticiable edema in area of third through fifth metatarsal head on left compared to right foot. Tender to palpation of  mass. Slighlty mobile in area.  No erythema edema or purulence noted.  No signs of infection. Musculoskeletal: MMT 5/5 bilateral lower extremities in DF, PF, Inversion and Eversion. Deceased ROM in DF of ankle joint.  Neurological: Sensation intact to light touch.   IMPRESSION: 1. Findings most compatible with a 6 x 8 x 3 mm ganglion/synovial cyst within the subcutaneous soft tissues of the plantar lateral forefoot, interposed between the plantar aponeurosis and the flexor tendons of the fourth metatarsal, at the level of the fourth metatarsal head. 2. Mild subcortical cystic change of the plantar aspect of the first metatarsal head at the articulation with the bipartite medial hallux sesamoid.   Assessment:   1. Soft tissue mass        Plan:  Patient was evaluated and treated and all questions answered. X-rays reviewed and discussed with patient. No acute fractures or dislcoations. No osseous deformities noted. Soft tissue edema noted in fourth and fifth metatarsal head areas.  Discussed masses of foot discussed possible cyst vs neuroma vs fibroma and treatment options with the patient. Planning for surgery end of November. CAM boot dispensed for post-op  Meds: Zofran , oxycodone  04/12/2024 (liquid form)  Aspiration site well-healed no signs of infection noted.    Asberry Failing, DPM

## 2024-10-14 NOTE — Telephone Encounter (Signed)
 Called and scheduled patient for surgery on 10/28/2024. Patient not on any GLP1 or blood thinners. Patients preferred pharmacy is correct in chart. Patient is aware [facility] will call 24-48 hours prior to surgery with arrival time and receipt of surgery bag.

## 2024-10-15 ENCOUNTER — Telehealth: Payer: Self-pay | Admitting: Podiatry

## 2024-10-15 NOTE — Telephone Encounter (Signed)
 DOS- 10/28/2024 (SURGERY WILL BE DONE AT [facility])  EXC GANGLION/TUMOR LT- 28090  [employer] EFFECTIVE DATE- 12/11/2020  DEDUCTIBLE- $0 REMAINING- $0 OOP- $4150 REMAINING- $2017.37 COINSURANCE- 0%  PER [facility] PORTAL, NO PRIOR AUTH IS REQUIRED FOR CPT CODE 71909. DOCUMENTATION ATTACHED TO SURGERY CONSENT PACKET.

## 2024-10-22 ENCOUNTER — Telehealth: Payer: Self-pay | Admitting: *Deleted

## 2024-10-22 ENCOUNTER — Encounter: Payer: Self-pay | Admitting: Podiatry

## 2024-10-22 NOTE — Telephone Encounter (Signed)
 I called [facility] and spoke to Gravity.  I called to check on the status of the patient's results.  She looked it up and stated they have not processed the sample.  She stated the requisition was not with the sample.  I informed her the requisition was sent with the sample.  She asked me to fax the requisition to [phone].  I faxed the requisition as requested.  Donzell attempted to call me back.  She stated additional information was needed.  I attempted to call her back.  I spoke to Grayson E.  He stated a test code was needed.  Dr. Sikora and I gave him the test Code of 10676 - Cytology of Non-Gynecological, fluid.  He stated he could not give me a result time frame.

## 2024-10-23 LAB — NON-GYN, SPECIMEN A

## 2024-10-23 LAB — CYTOLOGY - NON PAP

## 2024-10-24 ENCOUNTER — Ambulatory Visit: Payer: Self-pay | Admitting: Podiatry

## 2024-10-27 ENCOUNTER — Other Ambulatory Visit: Payer: Self-pay | Admitting: Podiatry

## 2024-10-27 MED ORDER — ONDANSETRON HCL 4 MG/5ML PO SOLN: 4.0000 mg | Freq: Three times a day (TID) | ORAL | 0 refills | Status: AC | PRN

## 2024-10-27 MED ORDER — OXYCODONE HCL 5 MG/5ML PO SOLN: 5.0000 mg | ORAL | 0 refills | Status: AC | PRN

## 2024-10-28 DIAGNOSIS — M7989 Other specified soft tissue disorders: Secondary | ICD-10-CM | POA: Diagnosis not present

## 2024-11-03 ENCOUNTER — Other Ambulatory Visit: Payer: Self-pay | Admitting: Podiatry

## 2024-11-03 MED ORDER — OXYCODONE HCL 5 MG/5ML PO SOLN: 5.0000 mg | ORAL | 0 refills | Status: AC | PRN

## 2024-11-04 ENCOUNTER — Encounter: Payer: Self-pay | Admitting: Podiatry

## 2024-11-04 ENCOUNTER — Ambulatory Visit (INDEPENDENT_AMBULATORY_CARE_PROVIDER_SITE_OTHER): Admitting: Podiatry

## 2024-11-04 VITALS — BP 145/90 | HR 86 | Temp 98.6°F

## 2024-11-04 DIAGNOSIS — M7989 Other specified soft tissue disorders: Secondary | ICD-10-CM

## 2024-11-04 DIAGNOSIS — Z9889 Other specified postprocedural states: Secondary | ICD-10-CM

## 2024-11-04 NOTE — Progress Notes (Addendum)
 Subjective:  Patient ID: Gloria Rodriguez, female    DOB: [birth year],  MRN: [id number]  Chief Complaint  Patient presents with   Routine Post Op    POV #1 DOS 10/28/2024 LT EXC GANGLION/TUMOR It's okay.  It's been tingling and it's numb.  There's some pain if I move it a certain way.  My pain level is a two or three.  It's just uncomfortable rather than painful at this point.  I can tell when I don't elevate it.  It tends to get hot, feels like I'm having a hot flash.    DOS: 10/28/24  Procedure: Left foot excision of ganglionic.  48 y.o. female returns for POV#1.  Relates doing okay she is getting tingling and numbness in the foot if she moves her foot in a certain way but overall just little uncomfortable.  Review of Systems: Negative except as noted in the HPI. Denies N/V/F/Ch.  Past Medical History:  Diagnosis Date   ADD (attention deficit disorder)    Allergy    allergy meds daily    Anxiety    hx bipolar, ADHD   Arthritis    left shoulder    Bruxism    Carpal tunnel syndrome    Chronic migraine    Depression    states well controlled   Diarrhea    Dyspnea    GERD (gastroesophageal reflux disease)    hx gallstones and pancreatitis per pt   Gluten intolerance    Headache(784.0)    History of kidney stones    HSV infection    on Valtrex  suppression   Hypotension    normal rens 80/60   Interstitial cystitis    Lactose intolerance    No pertinent past medical history    states with gall bladder attacks with chest pain, vomiting and diarrhes    EKG  in [facility]   OCD (obsessive compulsive disorder)    Periodic limb movement    Postpartum care and examination of lactating mother 07/13/2011   Sleep disorder 03/03/2014   SOB (shortness of breath)    Swelling     Current Outpatient Medications:    ALPRAZolam  (XANAX ) 0.25 MG tablet, Take 0.125-0.25 mg by mouth daily as needed., Disp: , Rfl:    amphetamine-dextroamphetamine (ADDERALL XR) 30 MG 24 hr capsule, Take 30 mg by  mouth daily with breakfast. , Disp: , Rfl:    amphetamine-dextroamphetamine (ADDERALL) 10 MG tablet, Take 10 mg by mouth daily with breakfast., Disp: , Rfl:    Fremanezumab -vfrm (AJOVY ) 225 MG/1.5ML SOAJ, Inject 225 mg into the skin every 30 (thirty) days., Disp: , Rfl:    hyoscyamine  (LEVSIN  SL) 0.125 MG SL tablet, TAKE 1 TABLET (0.125 MG TOTAL) BY MOUTH EVERY 6 (SIX) HOURS AS NEEDED. *NOT COVERED (Patient taking differently: Take 0.125 mg by mouth every 6 (six) hours as needed for cramping.), Disp: 120 tablet, Rfl: 1   lactase (LACTAID) 3000 units tablet, Take 3,000-6,000 Units by mouth 3 (three) times daily with meals as needed (lactose intolerance)., Disp: , Rfl:    lamoTRIgine  (LAMICTAL ) 150 MG tablet, Take 150 mg by mouth daily., Disp: , Rfl:    levocetirizine (XYZAL) 5 MG tablet, Take 5 mg by mouth at bedtime., Disp: , Rfl:    levonorgestrel  (MIRENA ) 20 MCG/24HR IUD, 1 each by Intrauterine route once., Disp: , Rfl:    Multiple Vitamins-Minerals (BARIATRIC MULTIVITAMINS PO), Take by mouth., Disp: , Rfl:    ondansetron  (ZOFRAN ) 4 MG/5ML solution, Take 5 mLs (4 mg total)  by mouth every 8 (eight) hours as needed for nausea or vomiting., Disp: 50 mL, Rfl: 0   OVER THE COUNTER MEDICATION, Trigger point injections in the scalp of  decadron  , Lidocaine , marcaine  -for chronic migraines as needed  Gets very 2 months, Disp: , Rfl:    oxyCODONE  (ROXICODONE ) 5 MG/5ML solution, Take 5 mLs (5 mg total) by mouth every 4 (four) hours as needed for up to 5 days for severe pain (pain score 7-10)., Disp: 15 mL, Rfl: 0   promethazine  (PHENERGAN ) 25 MG tablet, Take 25 mg by mouth every 6 (six) hours as needed for nausea or vomiting (for migraine headaches)., Disp: , Rfl:    Rimegepant Sulfate  (NURTEC) 75 MG TBDP, Take 1 tablet (75 mg total) by mouth daily as needed. For migraines. Take as close to onset of migraine as possible. One daily maximum., Disp: 16 tablet, Rfl: 11   sertraline  (ZOLOFT ) 100 MG tablet, Take by  mouth., Disp: , Rfl:    sodium chloride  0.9 % SOLN 100 mL with Eptinezumab-jjmr 100 MG/ML SOLN 100 mg, Inject 100 mg into the vein every 3 (three) months., Disp: 100 mg, Rfl: 4   timolol  (BETIMOL ) 0.5 % ophthalmic solution, Place 1 drop into both eyes 2 (two) times daily as needed. For migraine as needed., Disp: 10 mL, Rfl: 12   Vitamin D , Ergocalciferol , (DRISDOL ) 1.25 MG (50000 UNIT) CAPS capsule, TAKE 1 CAPSULE (50,000 UNITS TOTAL) BY MOUTH EVERY 7 (SEVEN) DAYS. Oral for 28, Disp: , Rfl:    Zavegepant HCl (ZAVZPRET ) 10 MG/ACT SOLN, Place 1 spray into the nose daily as needed., Disp: 2 each, Rfl:   Social History   Tobacco Use  Smoking Status Never  Smokeless Tobacco Never    Allergies  Allergen Reactions   Other Itching and Other (See Comments)    Any pain med that is in a tablet form. Causes Itching, vomiting,migraine- is the sodium binding in the tablet form   Patient states ok for liquids, injections, just not in tablet form   Protonix  [Pantoprazole ] Other (See Comments)    GI Issues All Proton pump inhibitors    Codeine Other (See Comments)    Migraines- can tolerate liquids   Hydrocodone  Other (See Comments)    migraines   Oxycodone  Other (See Comments)    migraines   Ultram [Tramadol Hcl] Other (See Comments)    Migraines/nightmares/ STATES ANY SODIUM BINDING TABLET CAUSES ALLERGIC REACTION   Vicodin [Hydrocodone -Acetaminophen ] Other (See Comments)    migraines   Objective:   Vitals:   11/04/24 0947  BP: (!) 145/90  Pulse: 86  Temp: 98.6 F (37 C)   There is no height or weight on file to calculate BMI. Constitutional Well developed. Well nourished.  Vascular Foot warm and well perfused. Capillary refill normal to all digits.   Neurologic Normal speech. Oriented to person, place, and time. Epicritic sensation to light touch grossly present bilaterally.  Dermatologic Skin healing well without signs of infection. Skin edges well coapted without signs of  infection.  Orthopedic: Tenderness to palpation noted about the surgical site.    Assessment:   1. Post-operative state   2. Soft tissue mass    Plan:  Patient was evaluated and treated and all questions answered.  S/p foot surgery left -Progressing as expected post-operatively. -WB Status: Weightbearing as tolerated in CAM boot  -Sutures: Intact. -Medications: N/A -Foot redressed.  Return in 2 weeks for suture removal  No follow-ups on file.

## 2024-11-05 ENCOUNTER — Encounter: Admitting: Podiatry

## 2024-11-11 ENCOUNTER — Encounter: Payer: Self-pay | Admitting: Podiatry

## 2024-11-11 ENCOUNTER — Ambulatory Visit: Payer: Self-pay | Admitting: Podiatry

## 2024-11-18 ENCOUNTER — Encounter: Payer: Self-pay | Admitting: Podiatry

## 2024-11-18 ENCOUNTER — Ambulatory Visit (INDEPENDENT_AMBULATORY_CARE_PROVIDER_SITE_OTHER): Admitting: Podiatry

## 2024-11-18 DIAGNOSIS — M7989 Other specified soft tissue disorders: Secondary | ICD-10-CM

## 2024-11-18 DIAGNOSIS — Z9889 Other specified postprocedural states: Secondary | ICD-10-CM

## 2024-11-18 NOTE — Progress Notes (Signed)
 Subjective:  Patient ID: Gloria Rodriguez, female    DOB: 05-11-1976,  MRN: [id number]  Chief Complaint  Patient presents with   Routine Post Op    Left 4th, 5th sub Met sutures all in place. Minimal redness, some swelling.  Not diabetic. No anti coag    DOS: 10/28/24  Procedure: Left foot excision of ganglionic.  48 y.o. female returns for POV#2.  Relates doing well.   Review of Systems: Negative except as noted in the HPI. Denies N/V/F/Ch.  Past Medical History:  Diagnosis Date   ADD (attention deficit disorder)    Allergy    allergy meds daily    Anxiety    hx bipolar, ADHD   Arthritis    left shoulder    Bruxism    Carpal tunnel syndrome    Chronic migraine    Depression    states well controlled   Diarrhea    Dyspnea    GERD (gastroesophageal reflux disease)    hx gallstones and pancreatitis per pt   Gluten intolerance    Headache(784.0)    History of kidney stones    HSV infection    on Valtrex  suppression   Hypotension    normal rens 80/60   Interstitial cystitis    Lactose intolerance    No pertinent past medical history    states with gall bladder attacks with chest pain, vomiting and diarrhes    EKG  in [facility]   OCD (obsessive compulsive disorder)    Periodic limb movement    Postpartum care and examination of lactating mother 07/13/2011   Sleep disorder 03/03/2014   SOB (shortness of breath)    Swelling     Current Outpatient Medications:    ALPRAZolam  (XANAX ) 0.25 MG tablet, Take 0.125-0.25 mg by mouth daily as needed., Disp: , Rfl:    amphetamine-dextroamphetamine (ADDERALL XR) 30 MG 24 hr capsule, Take 30 mg by mouth daily with breakfast. , Disp: , Rfl:    amphetamine-dextroamphetamine (ADDERALL) 10 MG tablet, Take 10 mg by mouth daily with breakfast., Disp: , Rfl:    hyoscyamine  (LEVSIN  SL) 0.125 MG SL tablet, TAKE 1 TABLET (0.125 MG TOTAL) BY MOUTH EVERY 6 (SIX) HOURS AS NEEDED. *NOT COVERED (Patient taking differently: Take 0.125 mg by mouth every 6  (six) hours as needed for cramping.), Disp: 120 tablet, Rfl: 1   lactase (LACTAID) 3000 units tablet, Take 3,000-6,000 Units by mouth 3 (three) times daily with meals as needed (lactose intolerance)., Disp: , Rfl:    levocetirizine (XYZAL) 5 MG tablet, Take 5 mg by mouth at bedtime., Disp: , Rfl:    levonorgestrel  (MIRENA ) 20 MCG/24HR IUD, 1 each by Intrauterine route once., Disp: , Rfl:    Multiple Vitamins-Minerals (BARIATRIC MULTIVITAMINS PO), Take by mouth., Disp: , Rfl:    ondansetron  (ZOFRAN ) 4 MG/5ML solution, Take 5 mLs (4 mg total) by mouth every 8 (eight) hours as needed for nausea or vomiting., Disp: 50 mL, Rfl: 0   OVER THE COUNTER MEDICATION, Trigger point injections in the scalp of  decadron  , Lidocaine , marcaine  -for chronic migraines as needed  Gets very 2 months, Disp: , Rfl:    promethazine  (PHENERGAN ) 25 MG tablet, Take 25 mg by mouth every 6 (six) hours as needed for nausea or vomiting (for migraine headaches)., Disp: , Rfl:    Rimegepant Sulfate  (NURTEC) 75 MG TBDP, Take 1 tablet (75 mg total) by mouth daily as needed. For migraines. Take as close to onset of migraine as possible. One  daily maximum., Disp: 16 tablet, Rfl: 11   sertraline  (ZOLOFT ) 100 MG tablet, Take by mouth., Disp: , Rfl:    sodium chloride  0.9 % SOLN 100 mL with Eptinezumab-jjmr 100 MG/ML SOLN 100 mg, Inject 100 mg into the vein every 3 (three) months., Disp: 100 mg, Rfl: 4   timolol  (BETIMOL ) 0.5 % ophthalmic solution, Place 1 drop into both eyes 2 (two) times daily as needed. For migraine as needed., Disp: 10 mL, Rfl: 12   Vitamin D , Ergocalciferol , (DRISDOL ) 1.25 MG (50000 UNIT) CAPS capsule, TAKE 1 CAPSULE (50,000 UNITS TOTAL) BY MOUTH EVERY 7 (SEVEN) DAYS. Oral for 28, Disp: , Rfl:    Zavegepant HCl (ZAVZPRET ) 10 MG/ACT SOLN, Place 1 spray into the nose daily as needed., Disp: 2 each, Rfl:   Social History   Tobacco Use  Smoking Status Never  Smokeless Tobacco Never    Allergies  Allergen Reactions    Other Itching and Other (See Comments)    Any pain med that is in a tablet form. Causes Itching, vomiting,migraine- is the sodium binding in the tablet form   Patient states ok for liquids, injections, just not in tablet form   Protonix  [Pantoprazole ] Other (See Comments)    GI Issues All Proton pump inhibitors    Codeine Other (See Comments)    Migraines- can tolerate liquids   Hydrocodone  Other (See Comments)    migraines   Oxycodone  Other (See Comments)    migraines   Ultram [Tramadol Hcl] Other (See Comments)    Migraines/nightmares/ STATES ANY SODIUM BINDING TABLET CAUSES ALLERGIC REACTION   Vicodin [Hydrocodone -Acetaminophen ] Other (See Comments)    migraines   Objective:   There were no vitals filed for this visit.  There is no height or weight on file to calculate BMI. Constitutional Well developed. Well nourished.  Vascular Foot warm and well perfused. Capillary refill normal to all digits.   Neurologic Normal speech. Oriented to person, place, and time. Epicritic sensation to light touch grossly present bilaterally.  Dermatologic Skin healing well without signs of infection. Skin edges well coapted without signs of infection.  Orthopedic: Tenderness to palpation noted about the surgical site.   Pathology:  Fibroadipose soft tissue with degenerative changes. Comment : The differential diagnosis includes ganglion cyst.   Assessment:   1. Post-operative state   2. Soft tissue mass    Plan:  Patient was evaluated and treated and all questions answered.  S/p foot surgery left -Progressing as expected post-operatively. -WB Status: May transition to WBAT in regular shoes  -Sutures: removed today without incident.  -Medications: N/A -Foot redressed.  Return in 3 weeks for recheck  Return in about 3 weeks (around 12/09/2024) for post op.

## 2024-11-20 ENCOUNTER — Encounter: Admitting: Podiatry

## 2024-12-09 ENCOUNTER — Encounter: Admitting: Podiatry

## 2024-12-24 ENCOUNTER — Ambulatory Visit (INDEPENDENT_AMBULATORY_CARE_PROVIDER_SITE_OTHER)

## 2024-12-24 ENCOUNTER — Encounter: Payer: Self-pay | Admitting: Podiatry

## 2024-12-24 ENCOUNTER — Ambulatory Visit (INDEPENDENT_AMBULATORY_CARE_PROVIDER_SITE_OTHER): Admitting: Podiatry

## 2024-12-24 DIAGNOSIS — R609 Edema, unspecified: Secondary | ICD-10-CM

## 2024-12-24 NOTE — Progress Notes (Signed)
 "  Subjective:  Patient ID: Gloria Rodriguez, female    DOB: 05/29/1976,  MRN: [id number]  Chief Complaint  Patient presents with   Routine Post Op    POV#4  DOS: 10/28/2024 Left foot excision of ganglion It's fine.  It's a little tender if I press it.  It healed fine.  As long as I'm not walking on the floor with no shoes on, I'm good.  I want her to see the difference in my foot from where I jammed it.  It's still a little swollen.  I'm not sure if there's a fracture.  My little toe feels like it gets stuck in position.  If I walk a while it seems like my foot turns purple.       DOS: 10/28/24  Procedure: Left foot excision of ganglionic.  49 y.o. female returns for POV#3.  Relates doing well.  She did stop her toe and wondering if she fractured something she is still getting some swelling in the feet.  Otherwise doing well  Review of Systems: Negative except as noted in the HPI. Denies N/V/F/Ch.  Past Medical History:  Diagnosis Date   ADD (attention deficit disorder)    Allergy    allergy meds daily    Anxiety    hx bipolar, ADHD   Arthritis    left shoulder    Bruxism    Carpal tunnel syndrome    Chronic migraine    Depression    states well controlled   Diarrhea    Dyspnea    GERD (gastroesophageal reflux disease)    hx gallstones and pancreatitis per pt   Gluten intolerance    Headache(784.0)    History of kidney stones    HSV infection    on Valtrex  suppression   Hypotension    normal rens 80/60   Interstitial cystitis    Lactose intolerance    No pertinent past medical history    states with gall bladder attacks with chest pain, vomiting and diarrhes    EKG  in EPIC   OCD (obsessive compulsive disorder)    Periodic limb movement    Postpartum care and examination of lactating mother 07/13/2011   Sleep disorder 03/03/2014   SOB (shortness of breath)    Swelling     Current Outpatient Medications:    ALPRAZolam  (XANAX ) 0.25 MG tablet, Take 0.125-0.25 mg by  mouth daily as needed., Disp: , Rfl:    amphetamine-dextroamphetamine (ADDERALL XR) 30 MG 24 hr capsule, Take 30 mg by mouth daily with breakfast. , Disp: , Rfl:    amphetamine-dextroamphetamine (ADDERALL) 10 MG tablet, Take 10 mg by mouth daily with breakfast., Disp: , Rfl:    hyoscyamine  (LEVSIN  SL) 0.125 MG SL tablet, TAKE 1 TABLET (0.125 MG TOTAL) BY MOUTH EVERY 6 (SIX) HOURS AS NEEDED. *NOT COVERED (Patient taking differently: Take 0.125 mg by mouth every 6 (six) hours as needed for cramping.), Disp: 120 tablet, Rfl: 1   lactase (LACTAID) 3000 units tablet, Take 3,000-6,000 Units by mouth 3 (three) times daily with meals as needed (lactose intolerance)., Disp: , Rfl:    levocetirizine (XYZAL) 5 MG tablet, Take 5 mg by mouth at bedtime., Disp: , Rfl:    levonorgestrel  (MIRENA ) 20 MCG/24HR IUD, 1 each by Intrauterine route once., Disp: , Rfl:    Multiple Vitamins-Minerals (BARIATRIC MULTIVITAMINS PO), Take by mouth., Disp: , Rfl:    ondansetron  (ZOFRAN ) 4 MG/5ML solution, Take 5 mLs (4 mg total) by mouth every 8 (eight)  hours as needed for nausea or vomiting., Disp: 50 mL, Rfl: 0   OVER THE COUNTER MEDICATION, Trigger point injections in the scalp of  decadron  , Lidocaine , marcaine  -for chronic migraines as needed  Gets very 2 months, Disp: , Rfl:    promethazine  (PHENERGAN ) 25 MG tablet, Take 25 mg by mouth every 6 (six) hours as needed for nausea or vomiting (for migraine headaches)., Disp: , Rfl:    Rimegepant Sulfate  (NURTEC) 75 MG TBDP, Take 1 tablet (75 mg total) by mouth daily as needed. For migraines. Take as close to onset of migraine as possible. One daily maximum., Disp: 16 tablet, Rfl: 11   sertraline  (ZOLOFT ) 100 MG tablet, Take by mouth., Disp: , Rfl:    sodium chloride  0.9 % SOLN 100 mL with Eptinezumab-jjmr 100 MG/ML SOLN 100 mg, Inject 100 mg into the vein every 3 (three) months., Disp: 100 mg, Rfl: 4   timolol  (BETIMOL ) 0.5 % ophthalmic solution, Place 1 drop into both eyes 2 (two)  times daily as needed. For migraine as needed., Disp: 10 mL, Rfl: 12   Vitamin D , Ergocalciferol , (DRISDOL ) 1.25 MG (50000 UNIT) CAPS capsule, TAKE 1 CAPSULE (50,000 UNITS TOTAL) BY MOUTH EVERY 7 (SEVEN) DAYS. Oral for 28, Disp: , Rfl:    Zavegepant HCl (ZAVZPRET ) 10 MG/ACT SOLN, Place 1 spray into the nose daily as needed., Disp: 2 each, Rfl:   Social History   Tobacco Use  Smoking Status Never  Smokeless Tobacco Never    Allergies  Allergen Reactions   Other Itching and Other (See Comments)    Any pain med that is in a tablet form. Causes Itching, vomiting,migraine- is the sodium binding in the tablet form   Patient states ok for liquids, injections, just not in tablet form   Protonix  [Pantoprazole ] Other (See Comments)    GI Issues All Proton pump inhibitors    Codeine Other (See Comments)    Migraines- can tolerate liquids   Hydrocodone  Other (See Comments)    migraines   Oxycodone  Other (See Comments)    migraines   Ultram [Tramadol Hcl] Other (See Comments)    Migraines/nightmares/ STATES ANY SODIUM BINDING TABLET CAUSES ALLERGIC REACTION   Vicodin [Hydrocodone -Acetaminophen ] Other (See Comments)    migraines   Objective:   There were no vitals filed for this visit.  There is no height or weight on file to calculate BMI. Constitutional Well developed. Well nourished.  Vascular Foot warm and well perfused. Capillary refill normal to all digits.   Neurologic Normal speech. Oriented to person, place, and time. Epicritic sensation to light touch grossly present bilaterally.  Dermatologic Skin healing well without signs of infection. Skin edges well coapted without signs of infection.  Orthopedic: Tenderness to palpation noted about the surgical site.   Pathology:  Fibroadipose soft tissue with degenerative changes. Comment : The differential diagnosis includes ganglion cyst.   Assessment:   1. Swelling    Plan:  Patient was evaluated and treated and all  questions answered.  S/p foot surgery left -Progressing as expected post-operatively. -WB Status: Bearing as tolerated in regular shoes -Medications: N/A   Discharge from surgical standpoint follow-up as needed. No follow-ups on file.   "

## 2025-03-25 ENCOUNTER — Ambulatory Visit: Admitting: Adult Health
